# Patient Record
Sex: Female | Born: 1965 | Race: Black or African American | Hispanic: No | Marital: Single | Smoking: Current every day smoker
Health system: Southern US, Community
[De-identification: ages and names within clinical notes are randomized; demographics above are authoritative.]

## PROBLEM LIST (undated history)

## (undated) DIAGNOSIS — Z789 Other specified health status: Secondary | ICD-10-CM

## (undated) DIAGNOSIS — F1020 Alcohol dependence, uncomplicated: Secondary | ICD-10-CM

## (undated) DIAGNOSIS — F329 Major depressive disorder, single episode, unspecified: Secondary | ICD-10-CM

## (undated) DIAGNOSIS — F32A Depression, unspecified: Secondary | ICD-10-CM

## (undated) HISTORY — PX: NO PAST SURGERIES: SHX2092

## (undated) HISTORY — DX: Other specified health status: Z78.9

---
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
## 2001-05-29 ENCOUNTER — Encounter: Payer: Self-pay | Admitting: Emergency Medicine

## 2001-05-29 ENCOUNTER — Emergency Department (HOSPITAL_COMMUNITY): Admission: EM | Admit: 2001-05-29 | Discharge: 2001-05-29 | Payer: Self-pay | Admitting: Emergency Medicine

## 2001-09-06 ENCOUNTER — Emergency Department (HOSPITAL_COMMUNITY): Admission: EM | Admit: 2001-09-06 | Discharge: 2001-09-06 | Payer: Self-pay | Admitting: Emergency Medicine

## 2001-09-06 ENCOUNTER — Encounter: Payer: Self-pay | Admitting: Emergency Medicine

## 2003-12-23 ENCOUNTER — Emergency Department (HOSPITAL_COMMUNITY): Admission: EM | Admit: 2003-12-23 | Discharge: 2003-12-23 | Payer: Self-pay | Admitting: Emergency Medicine

## 2006-01-12 ENCOUNTER — Emergency Department (HOSPITAL_COMMUNITY): Admission: EM | Admit: 2006-01-12 | Discharge: 2006-01-12 | Payer: Self-pay | Admitting: Emergency Medicine

## 2006-03-04 ENCOUNTER — Emergency Department (HOSPITAL_COMMUNITY): Admission: EM | Admit: 2006-03-04 | Discharge: 2006-03-04 | Payer: Self-pay | Admitting: Emergency Medicine

## 2006-08-21 ENCOUNTER — Emergency Department (HOSPITAL_COMMUNITY): Admission: EM | Admit: 2006-08-21 | Discharge: 2006-08-22 | Payer: Self-pay | Admitting: Emergency Medicine

## 2007-10-26 ENCOUNTER — Emergency Department (HOSPITAL_COMMUNITY): Admission: EM | Admit: 2007-10-26 | Discharge: 2007-10-26 | Payer: Self-pay | Admitting: Emergency Medicine

## 2011-05-28 LAB — CBC
HCT: 33.4 — ABNORMAL LOW
MCHC: 34.6
MCV: 102.9 — ABNORMAL HIGH
Platelets: 61 — ABNORMAL LOW
RDW: 21.4 — ABNORMAL HIGH

## 2011-05-28 LAB — RAPID URINE DRUG SCREEN, HOSP PERFORMED
Amphetamines: NOT DETECTED
Benzodiazepines: NOT DETECTED
Cocaine: NOT DETECTED
Tetrahydrocannabinol: NOT DETECTED

## 2011-05-28 LAB — DIFFERENTIAL
Basophils Absolute: 0
Eosinophils Absolute: 0
Lymphs Abs: 0.8
Monocytes Absolute: 0.1

## 2011-05-28 LAB — URINALYSIS, ROUTINE W REFLEX MICROSCOPIC
Specific Gravity, Urine: 1.023
Urobilinogen, UA: 1

## 2011-05-28 LAB — I-STAT 8, (EC8 V) (CONVERTED LAB)
BUN: 7
Bicarbonate: 24.2 — ABNORMAL HIGH
Glucose, Bld: 99
Hemoglobin: 12.9
Sodium: 128 — ABNORMAL LOW
pCO2, Ven: 30.4 — ABNORMAL LOW

## 2011-05-28 LAB — URINE MICROSCOPIC-ADD ON

## 2016-04-27 ENCOUNTER — Encounter (HOSPITAL_COMMUNITY): Payer: Self-pay | Admitting: Emergency Medicine

## 2016-04-27 ENCOUNTER — Ambulatory Visit (HOSPITAL_COMMUNITY)
Admission: EM | Admit: 2016-04-27 | Discharge: 2016-04-27 | Disposition: A | Discharge status: Home / Self Care | Payer: Self-pay | Attending: Family Medicine | Admitting: Family Medicine

## 2016-04-27 DIAGNOSIS — R05 Cough: Secondary | ICD-10-CM

## 2016-04-27 DIAGNOSIS — R059 Cough, unspecified: Secondary | ICD-10-CM

## 2016-04-27 DIAGNOSIS — J40 Bronchitis, not specified as acute or chronic: Secondary | ICD-10-CM

## 2016-04-27 MED ORDER — BENZONATATE 100 MG PO CAPS: 200.0000 mg | ORAL_CAPSULE | Freq: Three times a day (TID) | ORAL | 0 refills | Status: DC | PRN

## 2016-04-27 MED ORDER — AZITHROMYCIN 250 MG PO TABS: 250.0000 mg | ORAL_TABLET | Freq: Every day | ORAL | 0 refills | Status: DC

## 2016-04-27 NOTE — ED Triage Notes (Signed)
[facility] with a complaint of fatigue x 1 week. The patient stated that she had a fever and chest congestion last week.

## 2016-06-01 ENCOUNTER — Encounter (HOSPITAL_COMMUNITY): Payer: Self-pay

## 2016-06-01 ENCOUNTER — Emergency Department (HOSPITAL_COMMUNITY): Payer: 59

## 2016-06-01 ENCOUNTER — Emergency Department (HOSPITAL_COMMUNITY)
Admission: EM | Admit: 2016-06-01 | Discharge: 2016-06-02 | Disposition: A | Discharge status: Home / Self Care | Payer: 59 | Attending: Emergency Medicine | Admitting: Emergency Medicine

## 2016-06-01 DIAGNOSIS — R0602 Shortness of breath: Secondary | ICD-10-CM | POA: Diagnosis present

## 2016-06-01 DIAGNOSIS — J189 Pneumonia, unspecified organism: Secondary | ICD-10-CM | POA: Diagnosis not present

## 2016-06-01 DIAGNOSIS — F17210 Nicotine dependence, cigarettes, uncomplicated: Secondary | ICD-10-CM | POA: Diagnosis not present

## 2016-06-01 LAB — BASIC METABOLIC PANEL
Anion gap: 20 — ABNORMAL HIGH (ref 5–15)
BUN: 9 mg/dL (ref 6–20)
CO2: 22 mmol/L (ref 22–32)
CREATININE: 0.69 mg/dL (ref 0.44–1.00)
Calcium: 9.2 mg/dL (ref 8.9–10.3)
Chloride: 93 mmol/L — ABNORMAL LOW (ref 101–111)
GFR calc Af Amer: >60 mL/min (ref >60)
GLUCOSE: 160 mg/dL — AB (ref 65–99)
Potassium: 2.7 mmol/L — CL (ref 3.5–5.1)
SODIUM: 135 mmol/L (ref 135–145)

## 2016-06-01 LAB — CBC
HCT: 37.4 % (ref 36.0–46.0)
Hemoglobin: 12.7 g/dL (ref 12.0–15.0)
MCH: 32.7 pg (ref 26.0–34.0)
MCHC: 34 g/dL (ref 30.0–36.0)
MCV: 96.4 fL (ref 78.0–100.0)
PLATELETS: 171 10*3/uL (ref 150–400)
RBC: 3.88 MIL/uL (ref 3.87–5.11)
RDW: 14.7 % (ref 11.5–15.5)
WBC: 14.6 10*3/uL — AB (ref 4.0–10.5)

## 2016-06-01 LAB — I-STAT TROPONIN, ED: Troponin i, poc: 0.01 ng/mL (ref 0.00–0.08)

## 2016-06-01 MED ORDER — ALBUTEROL SULFATE (2.5 MG/3ML) 0.083% IN NEBU
5.0000 mg | INHALATION_SOLUTION | Freq: Once | RESPIRATORY_TRACT | Status: AC
  Administered 2016-06-02: 5 mg via RESPIRATORY_TRACT
  Filled 2016-06-01: qty 6

## 2016-06-01 MED ORDER — SODIUM CHLORIDE 0.9 % IV SOLN
1000.0000 mL | Freq: Once | INTRAVENOUS | Status: AC
  Administered 2016-06-02: 1000 mL via INTRAVENOUS

## 2016-06-01 MED ORDER — SODIUM CHLORIDE 0.9 % IV SOLN
1000.0000 mL | INTRAVENOUS | Status: DC
  Administered 2016-06-02: 1000 mL via INTRAVENOUS

## 2016-06-01 MED ORDER — IPRATROPIUM BROMIDE 0.02 % IN SOLN
0.5000 mg | Freq: Once | RESPIRATORY_TRACT | Status: AC
  Administered 2016-06-02: 0.5 mg via RESPIRATORY_TRACT
  Filled 2016-06-01: qty 2.5

## 2016-06-01 MED ORDER — PREDNISONE 20 MG PO TABS
60.0000 mg | ORAL_TABLET | Freq: Once | ORAL | Status: DC
  Filled 2016-06-01: qty 3

## 2016-06-01 NOTE — ED Triage Notes (Addendum)
Pt has had shortness of breath X2 months. Pt reports she was given abx without improvement. Pt is very hoarse. Tachycardic in triage. Resp e/u. Lung sounds clear, throat and tongue very red.

## 2016-06-01 NOTE — ED Provider Notes (Signed)
[facility]-EMERGENCY DEPT Provider Note   CSN: [id number] Arrival date & time: 06/01/16  1903   By signing my name below, I, [name], attest that this documentation has been prepared under the direction and in the presence of [name], MD  Electronically Signed: [name], ED Scribe. 06/01/16. 11:46 PM.   History   Chief Complaint Chief Complaint  Patient presents with  . Shortness of Breath     The history is provided by the patient. No language interpreter was used.   HPI Comments:  Virginia Hess is a 50 y.o. female who presents to the Emergency Department complaining of SOB onset 2 months ago. Associated symptoms include fatigue, leg pain, change in appetite and mild chest pain. She notes there is a problem of air circulation at her place of work at [employer]. Pt believes the coldness at work is exacerbating her symptoms. Pt states she visited UC on 04/27/16 and was prescribed antibiotics with relief but notes her symptoms returned. She denies abdominal pain, a hx of COPD or emphysema, and taking breathing treatments. Pt is a smoker for 30 years and occasionally uses alcohol.    History reviewed. No pertinent past medical history.  There are no active problems to display for this patient.   History reviewed. No pertinent surgical history.  OB History    No data available       Home Medications    Prior to Admission medications   Medication Sig Start Date End Date Taking? Authorizing Provider  azithromycin (ZITHROMAX) 250 MG tablet Take 1 tablet (250 mg total) by mouth daily. Take first 2 tablets together, then 1 every day until finished. 04/27/16   [name], FNP  benzonatate (TESSALON) 100 MG capsule Take 2 capsules (200 mg total) by mouth 3 (three) times daily as needed for cough. 04/27/16   [name], FNP    Family History History reviewed. No pertinent family history.  Social History Social History  Substance Use Topics  . Smoking  status: Current Every Day Smoker    Packs/day: 0.50    Years: 20.00    Types: Cigarettes  . Smokeless tobacco: Never Used  . Alcohol use Yes     Comment: occassional     Allergies   Review of patient's allergies indicates no known allergies.   Review of Systems Review of Systems  Constitutional: Positive for appetite change and fatigue.  Respiratory: Positive for shortness of breath.   Cardiovascular: Positive for chest pain.  Gastrointestinal: Negative for abdominal pain.  Musculoskeletal: Positive for myalgias.   10 systems reviewed and all are negative for acute change except as noted in the HPI.    Physical Exam Updated Vital Signs BP 148/94   Pulse 116   Temp 98.7 F (37.1 C)   Resp 18   Ht 5\' 4"  (1.626 m)   Wt 160 lb (72.6 kg)   SpO2 98%   BMI 27.46 kg/m   Physical Exam  Constitutional: She is oriented to person, place, and time. She appears well-developed and well-nourished. No distress.  HENT:  Head: Normocephalic and atraumatic.  Eyes: EOM are normal.  Neck: Normal range of motion.  Cardiovascular: Regular rhythm.   Tachycardic heart rate  Pulmonary/Chest: Effort normal and breath sounds normal.  Bilateral rhonchi  Abdominal: Soft. She exhibits no distension. There is no tenderness.  Musculoskeletal: Normal range of motion.  Neurological: She is alert and oriented to person, place, and time.  Skin: Skin is warm and dry.  Psychiatric: She has a normal mood and affect. Judgment normal.  Nursing note and vitals reviewed.    ED Treatments / Results  DIAGNOSTIC STUDIES:  Oxygen Saturation is 98% on RA, normal by my interpretation.    COORDINATION OF CARE:  11:37 PM Discussed treatment plan with pt at bedside and pt agreed to plan.\  Labs (all labs ordered are listed, but only abnormal results are displayed) Labs Reviewed  BASIC METABOLIC PANEL - Abnormal; Notable for the following:       Result Value   Potassium 2.7 (*)    Chloride 93 (*)      Glucose, Bld 160 (*)    Anion gap 20 (*)    All other components within normal limits  CBC - Abnormal; Notable for the following:    WBC 14.6 (*)    All other components within normal limits  ETHANOL - Abnormal; Notable for the following:    Alcohol, Ethyl (B) 158 (*)    All other components within normal limits  I-STAT TROPOININ, ED    EKG  EKG Interpretation  Date/Time:  Tuesday June 01 2016 19:08:30 EDT Ventricular Rate:  139 PR Interval:    QRS Duration: 66 QT Interval:  358 QTC Calculation: 544 R Axis:   52 Text Interpretation:  Sinus tachycardia Nonspecific ST and T wave abnormality Abnormal ECG nonspecific st changes new since prior tracing Confirmed by [name]  MD, Caryn BeeKEVIN ([id number]) on 06/01/2016 11:19:10 PM       Radiology Dg Chest 2 View  Result Date: 06/01/2016 CLINICAL DATA:  Chest pain, shortness of breath, cough EXAM: CHEST  2 VIEW COMPARISON:  10/26/2007 FINDINGS: The heart size and mediastinal contours are within normal limits. Both lungs are clear. The visualized skeletal structures are unremarkable. IMPRESSION: No active cardiopulmonary disease. Electronically Signed   By: Elige KoHetal  Patel   On: 06/01/2016 20:50   Ct Angio Chest Pe W And/or Wo Contrast  Result Date: 06/02/2016 CLINICAL DATA:  50 year old female with shortness of breath and chest pain and tachycardia. EXAM: CT ANGIOGRAPHY CHEST WITH CONTRAST TECHNIQUE: Multidetector CT imaging of the chest was performed using the standard protocol during bolus administration of intravenous contrast. Multiplanar CT image reconstructions and MIPs were obtained to evaluate the vascular anatomy. CONTRAST:  100 cc Isovue 370 COMPARISON:  Chest radiograph dated 06/01/2016 FINDINGS: No CT evidence of pulmonary embolism. There is a focal area of subpleural nodular and ground-glass density at the right lung base concerning for developing pneumonia. Focal pulmonary infarct is less likely in the absence of pulmonary embolism.  Clinical correlation and follow-up to resolution recommended. The remainder of the lungs are clear. There is no pleural effusion or pneumothorax. The central airways are patent. The thoracic aorta appears unremarkable. The origins of the great vessels of the aortic arch appear patent. There is no cardiomegaly or pericardial effusion. No hilar or mediastinal adenopathy. The esophagus is grossly unremarkable. No thyroid nodules identified. There is no axillary adenopathy. The chest wall soft tissues appear unremarkable. The osseous structures are intact. Diffuse fatty infiltration of the liver. A 2.5 cm left adrenal adenoma. The visualized upper abdomen is otherwise unremarkable. IMPRESSION: No CT evidence of pulmonary embolism. Small focal subpleural density at the right lung base concerning for pneumonia. Clinical correlation and follow-up to resolution recommended. Electronically Signed   By: Elgie CollardArash  Radparvar M.D.   On: 06/02/2016 03:22    Procedures Procedures (including critical care time)  Medications Ordered in ED Medications  predniSONE (DELTASONE) tablet 60 mg (  60 mg Oral Not Given 06/02/16 0100)  0.9 %  sodium chloride infusion (0 mLs Intravenous Stopped 06/02/16 0218)    Followed by  0.9 %  sodium chloride infusion (0 mLs Intravenous Stopped 06/02/16 0433)  albuterol (PROVENTIL) (2.5 MG/3ML) 0.083% nebulizer solution 5 mg (5 mg Nebulization Given 06/02/16 0034)  ipratropium (ATROVENT) nebulizer solution 0.5 mg (0.5 mg Nebulization Given 06/02/16 0034)  methylPREDNISolone sodium succinate (SOLU-MEDROL) 125 mg/2 mL injection 125 mg (125 mg Intravenous Given 06/02/16 0153)  iopamidol (ISOVUE-370) 76 % injection (100 mLs  Contrast Given 06/02/16 0143)     Initial Impression / Assessment and Plan / ED Course  I have reviewed the triage vital signs and the nursing notes.  Pertinent labs & imaging results that were available during my care of the patient were reviewed by me and considered in my  medical decision making (see chart for details).  Clinical Course    I suspect the patient has some degree of COPD exacerbation.  Pneumonia noted on CT chest.  No PE.  Patient feels better this time.  Home with albuterol, prednisone, Levaquin.   Final Clinical Impressions(s) / ED Diagnoses   Final diagnoses:  CAP (community acquired pneumonia)    New Prescriptions Discharge Medication List as of 06/02/2016  3:55 AM    START taking these medications   Details  albuterol (PROVENTIL HFA;VENTOLIN HFA) 108 (90 Base) MCG/ACT inhaler Inhale 2 puffs into the lungs every 4 (four) hours as needed for wheezing or shortness of breath., Starting Wed 06/02/2016, Print    levofloxacin (LEVAQUIN) 500 MG tablet Take 1 tablet (500 mg total) by mouth daily., Starting Wed 06/02/2016, Print    predniSONE (DELTASONE) 10 MG tablet Take 6 tablets (60 mg total) by mouth daily., Starting Wed 06/02/2016, Print        I personally performed the services described in this documentation, which was scribed in my presence. The recorded information has been reviewed and is accurate.        [name], MD 06/02/16 [phone]

## 2016-06-02 ENCOUNTER — Emergency Department (HOSPITAL_COMMUNITY): Payer: 59

## 2016-06-02 LAB — ETHANOL: ALCOHOL ETHYL (B): 158 mg/dL — AB (ref <5)

## 2016-06-02 MED ORDER — PREDNISONE 10 MG PO TABS: 60.0000 mg | ORAL_TABLET | Freq: Every day | ORAL | 0 refills | Status: DC

## 2016-06-02 MED ORDER — LEVOFLOXACIN 500 MG PO TABS: 500.0000 mg | ORAL_TABLET | Freq: Every day | ORAL | 0 refills | Status: DC

## 2016-06-02 MED ORDER — METHYLPREDNISOLONE SODIUM SUCC 125 MG IJ SOLR
125.0000 mg | Freq: Once | INTRAMUSCULAR | Status: AC
  Administered 2016-06-02: 125 mg via INTRAVENOUS
  Filled 2016-06-02: qty 2

## 2016-06-02 MED ORDER — ALBUTEROL SULFATE HFA 108 (90 BASE) MCG/ACT IN AERS: 2.0000 | INHALATION_SPRAY | RESPIRATORY_TRACT | 0 refills | Status: DC | PRN

## 2016-06-02 MED ORDER — IOPAMIDOL (ISOVUE-370) INJECTION 76%
INTRAVENOUS | Status: AC
Start: 2016-06-02 — End: 2016-06-02
  Administered 2016-06-02: 100 mL
  Filled 2016-06-02: qty 100

## 2016-06-02 MED ORDER — METHYLPREDNISOLONE SODIUM SUCC 125 MG IJ SOLR: 125.0000 mg | Freq: Once | INTRAMUSCULAR | Status: DC

## 2016-06-02 NOTE — ED Notes (Signed)
IV attempted x 1. 

## 2016-06-29 ENCOUNTER — Inpatient Hospital Stay (HOSPITAL_COMMUNITY)
Admission: EM | Admit: 2016-06-29 | Discharge: 2016-07-05 | DRG: 897 | Disposition: A | Discharge status: Home / Self Care | Payer: 59 | Attending: Internal Medicine | Admitting: Internal Medicine

## 2016-06-29 ENCOUNTER — Encounter (HOSPITAL_COMMUNITY): Payer: Self-pay | Admitting: Emergency Medicine

## 2016-06-29 ENCOUNTER — Emergency Department (HOSPITAL_COMMUNITY): Payer: 59

## 2016-06-29 DIAGNOSIS — F339 Major depressive disorder, recurrent, unspecified: Secondary | ICD-10-CM

## 2016-06-29 DIAGNOSIS — Z6821 Body mass index (BMI) 21.0-21.9, adult: Secondary | ICD-10-CM | POA: Diagnosis not present

## 2016-06-29 DIAGNOSIS — F321 Major depressive disorder, single episode, moderate: Secondary | ICD-10-CM

## 2016-06-29 DIAGNOSIS — Z7952 Long term (current) use of systemic steroids: Secondary | ICD-10-CM | POA: Diagnosis not present

## 2016-06-29 DIAGNOSIS — K8010 Calculus of gallbladder with chronic cholecystitis without obstruction: Secondary | ICD-10-CM | POA: Diagnosis present

## 2016-06-29 DIAGNOSIS — R7989 Other specified abnormal findings of blood chemistry: Secondary | ICD-10-CM | POA: Diagnosis present

## 2016-06-29 DIAGNOSIS — E871 Hypo-osmolality and hyponatremia: Secondary | ICD-10-CM | POA: Diagnosis present

## 2016-06-29 DIAGNOSIS — E872 Acidosis, unspecified: Secondary | ICD-10-CM

## 2016-06-29 DIAGNOSIS — F10129 Alcohol abuse with intoxication, unspecified: Secondary | ICD-10-CM | POA: Diagnosis present

## 2016-06-29 DIAGNOSIS — Z79899 Other long term (current) drug therapy: Secondary | ICD-10-CM | POA: Diagnosis not present

## 2016-06-29 DIAGNOSIS — R634 Abnormal weight loss: Secondary | ICD-10-CM | POA: Diagnosis present

## 2016-06-29 DIAGNOSIS — D539 Nutritional anemia, unspecified: Secondary | ICD-10-CM | POA: Diagnosis present

## 2016-06-29 DIAGNOSIS — K7010 Alcoholic hepatitis without ascites: Secondary | ICD-10-CM

## 2016-06-29 DIAGNOSIS — E876 Hypokalemia: Secondary | ICD-10-CM

## 2016-06-29 DIAGNOSIS — F1020 Alcohol dependence, uncomplicated: Secondary | ICD-10-CM

## 2016-06-29 DIAGNOSIS — R0600 Dyspnea, unspecified: Secondary | ICD-10-CM

## 2016-06-29 DIAGNOSIS — F329 Major depressive disorder, single episode, unspecified: Secondary | ICD-10-CM | POA: Diagnosis present

## 2016-06-29 DIAGNOSIS — E860 Dehydration: Secondary | ICD-10-CM | POA: Diagnosis present

## 2016-06-29 DIAGNOSIS — R638 Other symptoms and signs concerning food and fluid intake: Secondary | ICD-10-CM | POA: Diagnosis present

## 2016-06-29 DIAGNOSIS — R945 Abnormal results of liver function studies: Secondary | ICD-10-CM

## 2016-06-29 DIAGNOSIS — N390 Urinary tract infection, site not specified: Secondary | ICD-10-CM

## 2016-06-29 DIAGNOSIS — R05 Cough: Secondary | ICD-10-CM | POA: Diagnosis present

## 2016-06-29 DIAGNOSIS — E873 Alkalosis: Secondary | ICD-10-CM

## 2016-06-29 DIAGNOSIS — F1010 Alcohol abuse, uncomplicated: Secondary | ICD-10-CM | POA: Diagnosis not present

## 2016-06-29 LAB — CBC
HCT: 25.9 % — ABNORMAL LOW (ref 36.0–46.0)
Hemoglobin: 9.3 g/dL — ABNORMAL LOW (ref 12.0–15.0)
MCH: 34.4 pg — AB (ref 26.0–34.0)
MCHC: 35.9 g/dL (ref 30.0–36.0)
MCV: 95.9 fL (ref 78.0–100.0)
PLATELETS: 179 10*3/uL (ref 150–400)
RBC: 2.7 MIL/uL — ABNORMAL LOW (ref 3.87–5.11)
RDW: 20 % — AB (ref 11.5–15.5)
WBC: 7.2 10*3/uL (ref 4.0–10.5)

## 2016-06-29 LAB — CBC WITH DIFFERENTIAL/PLATELET
BASOS PCT: 0 %
Basophils Absolute: 0 10*3/uL (ref 0.0–0.1)
EOS PCT: 0 %
Eosinophils Absolute: 0 10*3/uL (ref 0.0–0.7)
HEMATOCRIT: 31.6 % — AB (ref 36.0–46.0)
Hemoglobin: 11.3 g/dL — ABNORMAL LOW (ref 12.0–15.0)
LYMPHS PCT: 27 %
Lymphs Abs: 2.3 10*3/uL (ref 0.7–4.0)
MCH: 34.6 pg — ABNORMAL HIGH (ref 26.0–34.0)
MCHC: 35.8 g/dL (ref 30.0–36.0)
MCV: 96.6 fL (ref 78.0–100.0)
MONOS PCT: 4 %
Monocytes Absolute: 0.3 10*3/uL (ref 0.1–1.0)
NEUTROS PCT: 69 %
Neutro Abs: 6 10*3/uL (ref 1.7–7.7)
PLATELETS: 202 10*3/uL (ref 150–400)
RBC: 3.27 MIL/uL — AB (ref 3.87–5.11)
RDW: 20.2 % — AB (ref 11.5–15.5)
WBC: 8.6 10*3/uL (ref 4.0–10.5)
nRBC: 2 /100 WBC — ABNORMAL HIGH

## 2016-06-29 LAB — RAPID URINE DRUG SCREEN, HOSP PERFORMED
AMPHETAMINES: NOT DETECTED
BENZODIAZEPINES: NOT DETECTED
Barbiturates: NOT DETECTED
Cocaine: NOT DETECTED
OPIATES: NOT DETECTED
TETRAHYDROCANNABINOL: NOT DETECTED

## 2016-06-29 LAB — BLOOD GAS, ARTERIAL
ACID-BASE DEFICIT: 2.1 mmol/L — AB (ref 0.0–2.0)
BICARBONATE: 20.8 mmol/L (ref 20.0–28.0)
Drawn by: 441261
FIO2: 21
O2 Saturation: 95.9 %
PCO2 ART: 29.8 mmHg — AB (ref 32.0–48.0)
PH ART: 7.457 — AB (ref 7.350–7.450)
PO2 ART: 89.4 mmHg (ref 83.0–108.0)
Patient temperature: 98.6

## 2016-06-29 LAB — MAGNESIUM
Magnesium: 1.6 mg/dL — ABNORMAL LOW (ref 1.7–2.4)
Magnesium: 1.9 mg/dL (ref 1.7–2.4)

## 2016-06-29 LAB — BASIC METABOLIC PANEL
Anion gap: 12 (ref 5–15)
BUN: 8 mg/dL (ref 6–20)
CHLORIDE: 93 mmol/L — AB (ref 101–111)
CO2: 24 mmol/L (ref 22–32)
CREATININE: 0.37 mg/dL — AB (ref 0.44–1.00)
Calcium: 7.4 mg/dL — ABNORMAL LOW (ref 8.9–10.3)
GFR calc non Af Amer: >60 mL/min (ref >60)
Glucose, Bld: 115 mg/dL — ABNORMAL HIGH (ref 65–99)
POTASSIUM: 3 mmol/L — AB (ref 3.5–5.1)
Sodium: 129 mmol/L — ABNORMAL LOW (ref 135–145)

## 2016-06-29 LAB — PHOSPHORUS: Phosphorus: 1.6 mg/dL — ABNORMAL LOW (ref 2.5–4.6)

## 2016-06-29 LAB — URINALYSIS, ROUTINE W REFLEX MICROSCOPIC
GLUCOSE, UA: NEGATIVE mg/dL
KETONES UR: NEGATIVE mg/dL
Nitrite: NEGATIVE
PH: 6 (ref 5.0–8.0)
Protein, ur: NEGATIVE mg/dL
SPECIFIC GRAVITY, URINE: 1.019 (ref 1.005–1.030)

## 2016-06-29 LAB — COMPREHENSIVE METABOLIC PANEL
ALBUMIN: 2.9 g/dL — AB (ref 3.5–5.0)
ALT: 128 U/L — ABNORMAL HIGH (ref 14–54)
ANION GAP: 25 — AB (ref 5–15)
AST: 476 U/L — AB (ref 15–41)
Alkaline Phosphatase: 670 U/L — ABNORMAL HIGH (ref 38–126)
BILIRUBIN TOTAL: 3.5 mg/dL — AB (ref 0.3–1.2)
BUN: 9 mg/dL (ref 6–20)
CHLORIDE: 82 mmol/L — AB (ref 101–111)
CO2: 20 mmol/L — ABNORMAL LOW (ref 22–32)
Calcium: 8.3 mg/dL — ABNORMAL LOW (ref 8.9–10.3)
Creatinine, Ser: 0.42 mg/dL — ABNORMAL LOW (ref 0.44–1.00)
GFR calc Af Amer: >60 mL/min (ref >60)
Glucose, Bld: 134 mg/dL — ABNORMAL HIGH (ref 65–99)
POTASSIUM: 2.4 mmol/L — AB (ref 3.5–5.1)
Sodium: 127 mmol/L — ABNORMAL LOW (ref 135–145)
TOTAL PROTEIN: 5.6 g/dL — AB (ref 6.5–8.1)

## 2016-06-29 LAB — RETICULOCYTES
RBC.: 2.7 MIL/uL — ABNORMAL LOW (ref 3.87–5.11)
RETIC CT PCT: 2.4 % (ref 0.4–3.1)
Retic Count, Absolute: 64.8 10*3/uL (ref 19.0–186.0)

## 2016-06-29 LAB — APTT: aPTT: 27 seconds (ref 24–36)

## 2016-06-29 LAB — URINE MICROSCOPIC-ADD ON

## 2016-06-29 LAB — PROTIME-INR
INR: 1.06
PROTHROMBIN TIME: 13.8 s (ref 11.4–15.2)

## 2016-06-29 LAB — LIPASE, BLOOD: LIPASE: 65 U/L — AB (ref 11–51)

## 2016-06-29 LAB — IRON AND TIBC
IRON: 118 ug/dL (ref 28–170)
SATURATION RATIOS: 97 % — AB (ref 10.4–31.8)
TIBC: 122 ug/dL — AB (ref 250–450)
UIBC: 4 ug/dL

## 2016-06-29 LAB — FOLATE: Folate: 18.7 ng/mL (ref >5.9)

## 2016-06-29 LAB — SALICYLATE LEVEL

## 2016-06-29 LAB — I-STAT CG4 LACTIC ACID, ED: LACTIC ACID, VENOUS: 12.93 mmol/L — AB (ref 0.5–1.9)

## 2016-06-29 LAB — OSMOLALITY: OSMOLALITY: 319 mosm/kg — AB (ref 275–295)

## 2016-06-29 LAB — CREATININE, SERUM: Creatinine, Ser: <0.3 mg/dL — ABNORMAL LOW (ref 0.44–1.00)

## 2016-06-29 LAB — CK: Total CK: 76 U/L (ref 38–234)

## 2016-06-29 LAB — ETHANOL: Alcohol, Ethyl (B): 177 mg/dL — ABNORMAL HIGH (ref <5)

## 2016-06-29 LAB — OSMOLALITY, URINE: OSMOLALITY UR: 551 mosm/kg (ref 300–900)

## 2016-06-29 LAB — ACETAMINOPHEN LEVEL: Acetaminophen (Tylenol), Serum: <10 ug/mL — ABNORMAL LOW (ref 10–30)

## 2016-06-29 LAB — VITAMIN B12: VITAMIN B 12: 946 pg/mL — AB (ref 180–914)

## 2016-06-29 LAB — FERRITIN: FERRITIN: 5230 ng/mL — AB (ref 11–307)

## 2016-06-29 MED ORDER — IPRATROPIUM-ALBUTEROL 0.5-2.5 (3) MG/3ML IN SOLN: 3.0000 mL | RESPIRATORY_TRACT | Status: DC | PRN

## 2016-06-29 MED ORDER — LORATADINE 10 MG PO TABS
10.0000 mg | ORAL_TABLET | Freq: Every day | ORAL | Status: DC
  Administered 2016-06-30 – 2016-07-05 (×6): 10 mg via ORAL
  Filled 2016-06-29 (×6): qty 1

## 2016-06-29 MED ORDER — M.V.I. ADULT IV INJ
INJECTION | Freq: Once | INTRAVENOUS | Status: AC
  Administered 2016-06-29: 15:00:00 via INTRAVENOUS
  Filled 2016-06-29: qty 1000

## 2016-06-29 MED ORDER — MAGNESIUM SULFATE 2 GM/50ML IV SOLN
2.0000 g | Freq: Once | INTRAVENOUS | Status: AC
  Administered 2016-06-29: 2 g via INTRAVENOUS
  Filled 2016-06-29: qty 50

## 2016-06-29 MED ORDER — POLYETHYLENE GLYCOL 3350 17 G PO PACK: 17.0000 g | PACK | Freq: Every day | ORAL | Status: DC | PRN

## 2016-06-29 MED ORDER — BENZONATATE 100 MG PO CAPS
100.0000 mg | ORAL_CAPSULE | Freq: Three times a day (TID) | ORAL | Status: DC
  Administered 2016-06-29 – 2016-07-05 (×17): 100 mg via ORAL
  Filled 2016-06-29 (×17): qty 1

## 2016-06-29 MED ORDER — ONDANSETRON HCL 4 MG/2ML IJ SOLN
4.0000 mg | Freq: Four times a day (QID) | INTRAMUSCULAR | Status: DC | PRN
  Administered 2016-07-03 – 2016-07-04 (×2): 4 mg via INTRAVENOUS
  Filled 2016-06-29 (×2): qty 2

## 2016-06-29 MED ORDER — THIAMINE HCL 100 MG/ML IJ SOLN
100.0000 mg | Freq: Once | INTRAMUSCULAR | Status: AC
  Administered 2016-06-29: 100 mg via INTRAVENOUS
  Filled 2016-06-29: qty 2

## 2016-06-29 MED ORDER — VITAMIN B-1 100 MG PO TABS
100.0000 mg | ORAL_TABLET | Freq: Every day | ORAL | Status: DC
  Administered 2016-06-30 – 2016-07-01 (×2): 100 mg via ORAL
  Filled 2016-06-29 (×2): qty 1

## 2016-06-29 MED ORDER — SODIUM CHLORIDE 0.9 % IV BOLUS (SEPSIS)
1000.0000 mL | Freq: Once | INTRAVENOUS | Status: AC
Start: 2016-06-29 — End: 2016-06-29
  Administered 2016-06-29: 1000 mL via INTRAVENOUS

## 2016-06-29 MED ORDER — CEFPODOXIME PROXETIL 200 MG PO TABS
200.0000 mg | ORAL_TABLET | Freq: Two times a day (BID) | ORAL | Status: DC
  Administered 2016-06-29 – 2016-07-04 (×10): 200 mg via ORAL
  Filled 2016-06-29 (×11): qty 1

## 2016-06-29 MED ORDER — THIAMINE HCL 100 MG/ML IJ SOLN: 100.0000 mg | Freq: Every day | INTRAMUSCULAR | Status: DC

## 2016-06-29 MED ORDER — FOLIC ACID 1 MG PO TABS
1.0000 mg | ORAL_TABLET | Freq: Once | ORAL | Status: AC
  Administered 2016-06-29: 1 mg via ORAL
  Filled 2016-06-29: qty 1

## 2016-06-29 MED ORDER — IPRATROPIUM-ALBUTEROL 0.5-2.5 (3) MG/3ML IN SOLN
3.0000 mL | Freq: Four times a day (QID) | RESPIRATORY_TRACT | Status: DC
  Administered 2016-06-29: 3 mL via RESPIRATORY_TRACT
  Filled 2016-06-29: qty 3

## 2016-06-29 MED ORDER — ENSURE ENLIVE PO LIQD
237.0000 mL | Freq: Two times a day (BID) | ORAL | Status: DC
  Administered 2016-06-30 – 2016-07-05 (×8): 237 mL via ORAL

## 2016-06-29 MED ORDER — PHENOL 1.4 % MT LIQD
1.0000 | OROMUCOSAL | Status: DC | PRN
  Filled 2016-06-29: qty 177

## 2016-06-29 MED ORDER — FOLIC ACID 1 MG PO TABS
1.0000 mg | ORAL_TABLET | Freq: Every day | ORAL | Status: DC
  Administered 2016-06-30 – 2016-07-05 (×6): 1 mg via ORAL
  Filled 2016-06-29 (×6): qty 1

## 2016-06-29 MED ORDER — ENOXAPARIN SODIUM 40 MG/0.4ML ~~LOC~~ SOLN
40.0000 mg | SUBCUTANEOUS | Status: DC
  Administered 2016-06-29 – 2016-07-04 (×6): 40 mg via SUBCUTANEOUS
  Filled 2016-06-29 (×6): qty 0.4

## 2016-06-29 MED ORDER — ACETAMINOPHEN 650 MG RE SUPP: 650.0000 mg | Freq: Four times a day (QID) | RECTAL | Status: DC | PRN

## 2016-06-29 MED ORDER — ADULT MULTIVITAMIN W/MINERALS CH
1.0000 | ORAL_TABLET | Freq: Every day | ORAL | Status: DC
Start: 2016-06-29 — End: 2016-07-05
  Administered 2016-06-30 – 2016-07-05 (×6): 1 via ORAL
  Filled 2016-06-29 (×6): qty 1

## 2016-06-29 MED ORDER — IBUPROFEN 200 MG PO TABS: 400.0000 mg | ORAL_TABLET | Freq: Four times a day (QID) | ORAL | Status: DC | PRN

## 2016-06-29 MED ORDER — ONDANSETRON HCL 4 MG PO TABS: 4.0000 mg | ORAL_TABLET | Freq: Four times a day (QID) | ORAL | Status: DC | PRN

## 2016-06-29 MED ORDER — POTASSIUM CHLORIDE 10 MEQ/100ML IV SOLN
10.0000 meq | INTRAVENOUS | Status: AC
  Administered 2016-06-29 (×3): 10 meq via INTRAVENOUS
  Filled 2016-06-29 (×3): qty 100

## 2016-06-29 MED ORDER — ACETAMINOPHEN 325 MG PO TABS
650.0000 mg | ORAL_TABLET | Freq: Four times a day (QID) | ORAL | Status: DC | PRN
  Administered 2016-07-02: 650 mg via ORAL
  Filled 2016-06-29: qty 2

## 2016-06-29 MED ORDER — POTASSIUM CHLORIDE CRYS ER 20 MEQ PO TBCR
40.0000 meq | EXTENDED_RELEASE_TABLET | ORAL | Status: AC
  Administered 2016-06-29: 40 meq via ORAL
  Filled 2016-06-29: qty 2

## 2016-06-29 MED ORDER — METHOCARBAMOL 1000 MG/10ML IJ SOLN
500.0000 mg | Freq: Three times a day (TID) | INTRAVENOUS | Status: DC | PRN
  Administered 2016-06-29: 500 mg via INTRAVENOUS
  Filled 2016-06-29: qty 550
  Filled 2016-06-29: qty 5

## 2016-06-29 MED ORDER — LORAZEPAM 1 MG PO TABS
1.0000 mg | ORAL_TABLET | Freq: Four times a day (QID) | ORAL | Status: AC | PRN
  Administered 2016-07-01: 1 mg via ORAL
  Filled 2016-06-29: qty 1

## 2016-06-29 MED ORDER — MAGNESIUM SULFATE 50 % IJ SOLN: 2.0000 g | Freq: Once | INTRAMUSCULAR | Status: DC

## 2016-06-29 MED ORDER — LORAZEPAM 2 MG/ML IJ SOLN
1.0000 mg | Freq: Four times a day (QID) | INTRAMUSCULAR | Status: AC | PRN
  Administered 2016-06-29 – 2016-06-30 (×3): 1 mg via INTRAVENOUS
  Filled 2016-06-29 (×3): qty 1

## 2016-06-29 MED ORDER — DM-GUAIFENESIN ER 30-600 MG PO TB12
1.0000 | ORAL_TABLET | Freq: Two times a day (BID) | ORAL | Status: DC
  Administered 2016-06-29 – 2016-07-05 (×12): 1 via ORAL
  Filled 2016-06-29 (×12): qty 1

## 2016-06-29 NOTE — ED Notes (Signed)
Dr Clydene PughKnott aware of critical lactic acid- 12.93

## 2016-06-29 NOTE — Progress Notes (Signed)
Pt confirms with ED CM she does not have pcp Pt with 2 female visitors at bedside

## 2016-06-29 NOTE — ED Triage Notes (Signed)
Reports has had a productive cough producing clear mucous. States she has had fevers that comes and goes.

## 2016-06-29 NOTE — ED Notes (Signed)
Gave patient chicken broth

## 2016-06-29 NOTE — ED Provider Notes (Signed)
[facility]-EMERGENCY DEPT Provider Note   CSN: [id number] Arrival date & time: 06/29/16  7564     History   Chief Complaint Chief Complaint  Patient presents with  . Shortness of Breath  . Leg Pain    Numbness and weakness    HPI Virginia Hess is a 50 y.o. female.  The history is provided by the patient.  Shortness of Breath  This is a new problem. The average episode lasts 1 week. The problem occurs continuously.The current episode started more than 1 week ago. The problem has been gradually worsening. Pertinent negatives include no fever, no cough, no syncope, no vomiting and no abdominal pain. Precipitated by: heavy alcohol use. She has tried nothing for the symptoms.    History reviewed. No pertinent past medical history.  There are no active problems to display for this patient.   History reviewed. No pertinent surgical history.  OB History    No data available       Home Medications    Prior to Admission medications   Medication Sig Start Date End Date Taking? Authorizing Provider  albuterol (PROVENTIL HFA;VENTOLIN HFA) 108 (90 Base) MCG/ACT inhaler Inhale 2 puffs into the lungs every 4 (four) hours as needed for wheezing or shortness of breath. 06/02/16  Yes [name], MD  azithromycin (ZITHROMAX) 250 MG tablet Take 1 tablet (250 mg total) by mouth daily. Take first 2 tablets together, then 1 every day until finished. Patient not taking: Reported on 06/29/2016 04/27/16   [name], FNP  benzonatate (TESSALON) 100 MG capsule Take 2 capsules (200 mg total) by mouth 3 (three) times daily as needed for cough. Patient not taking: Reported on 06/29/2016 04/27/16   [name], FNP  levofloxacin (LEVAQUIN) 500 MG tablet Take 1 tablet (500 mg total) by mouth daily. Patient not taking: Reported on 06/29/2016 06/02/16   [name], MD  predniSONE (DELTASONE) 10 MG tablet Take 6 tablets (60 mg total) by mouth daily. Patient not taking: Reported on 06/29/2016  06/02/16   [name], MD    Family History No family history on file.  Social History Social History  Substance Use Topics  . Smoking status: Current Every Day Smoker    Packs/day: 0.50    Years: 20.00    Types: Cigarettes  . Smokeless tobacco: Never Used  . Alcohol use Yes     Comment: occassional     Allergies   Review of patient's allergies indicates no known allergies.   Review of Systems Review of Systems  Constitutional: Negative for fever.  Respiratory: Positive for shortness of breath. Negative for cough.   Cardiovascular: Negative for syncope.  Gastrointestinal: Negative for abdominal pain and vomiting.  All other systems reviewed and are negative.    Physical Exam Updated Vital Signs BP (!) 162/101 (BP Location: Left Arm)   Pulse (!) 128   Temp 99.5 F (37.5 C)   Resp 20   SpO2 98%   Physical Exam  Constitutional: She is oriented to person, place, and time. She appears well-developed. She appears listless. She has a sickly appearance. No distress.  HENT:  Head: Normocephalic.  Nose: Nose normal.  Eyes: Conjunctivae are normal.  Neck: Neck supple. No tracheal deviation present.  Cardiovascular: Regular rhythm and normal heart sounds.  Tachycardia present.   Pulmonary/Chest: Effort normal. Tachypnea noted.  Abdominal: Soft. She exhibits no distension. There is tenderness (mild upper).  Neurological: She is oriented to person, place, and time. She has normal strength.  She appears listless. She displays atrophy (diffuse). No cranial nerve deficit. GCS eye subscore is 4. GCS verbal subscore is 5. GCS motor subscore is 6.  Skin: Skin is warm and dry.  Psychiatric: She has a normal mood and affect.     ED Treatments / Results  Labs (all labs ordered are listed, but only abnormal results are displayed) Labs Reviewed  ETHANOL - Abnormal; Notable for the following:       Result Value   Alcohol, Ethyl (B) 177 (*)    All other components within normal  limits  CBC WITH DIFFERENTIAL/PLATELET - Abnormal; Notable for the following:    RBC 3.27 (*)    Hemoglobin 11.3 (*)    HCT 31.6 (*)    MCH 34.6 (*)    RDW 20.2 (*)    nRBC 2 (*)    All other components within normal limits  LIPASE, BLOOD - Abnormal; Notable for the following:    Lipase 65 (*)    All other components within normal limits  COMPREHENSIVE METABOLIC PANEL - Abnormal; Notable for the following:    Sodium 127 (*)    Potassium 2.4 (*)    Chloride 82 (*)    CO2 20 (*)    Glucose, Bld 134 (*)    Creatinine, Ser 0.42 (*)    Calcium 8.3 (*)    Total Protein 5.6 (*)    Albumin 2.9 (*)    AST 476 (*)    ALT 128 (*)    Alkaline Phosphatase 670 (*)    Total Bilirubin 3.5 (*)    Anion gap 25 (*)    All other components within normal limits  BLOOD GAS, ARTERIAL - Abnormal; Notable for the following:    pH, Arterial 7.457 (*)    pCO2 arterial 29.8 (*)    Acid-base deficit 2.1 (*)    All other components within normal limits  MAGNESIUM - Abnormal; Notable for the following:    Magnesium 1.6 (*)    All other components within normal limits  I-STAT CG4 LACTIC ACID, ED - Abnormal; Notable for the following:    Lactic Acid, Venous 12.93 (*)    All other components within normal limits  CULTURE, BLOOD (ROUTINE X 2)  CULTURE, BLOOD (ROUTINE X 2)  URINE CULTURE  CK  RAPID URINE DRUG SCREEN, HOSP PERFORMED  URINALYSIS, ROUTINE W REFLEX MICROSCOPIC (NOT AT [facility])  SALICYLATE LEVEL  ACETAMINOPHEN LEVEL  OSMOLALITY  OSMOLALITY, URINE    EKG  EKG Interpretation  Date/Time:  Tuesday June 29 2016 08:28:18 EDT Ventricular Rate:  124 PR Interval:    QRS Duration: 71 QT Interval:  301 QTC Calculation: 433 R Axis:   41 Text Interpretation:  Sinus tachycardia LAE, consider biatrial enlargement Borderline repolarization abnormality Since last tracing rate slower Otherwise no significant change Confirmed by Shuntavia Yerby MD, Reade Trefz [phone]) on 06/29/2016 8:38:05 AM        Radiology Dg Chest 2 View  Result Date: 06/29/2016 CLINICAL DATA:  Shortness of breath.  Smoker. EXAM: CHEST  2 VIEW COMPARISON:  06/01/2016 FINDINGS: Lateral view degraded by patient arm position. Moderate right hemidiaphragm elevation, increased. Midline trachea. Normal heart size and mediastinal contours. No pleural effusion or pneumothorax. Clear lungs. IMPRESSION: No acute cardiopulmonary disease. Increase in moderate right hemidiaphragm elevation since 06/01/2016. Electronically Signed   By: Jeronimo GreavesKyle  Talbot M.D.   On: 06/29/2016 09:31    Procedures Procedures (including critical care time)  CRITICAL CARE Performed by: Lyndal PulleyKnott, Tine Mabee Total critical care time:  30 minutes Critical care time was exclusive of separately billable procedures and treating other patients. Critical care was necessary to treat or prevent imminent or life-threatening deterioration. Critical care was time spent personally by me on the following activities: development of treatment plan with patient and/or surrogate as well as nursing, discussions with consultants, evaluation of patient's response to treatment, examination of patient, obtaining history from patient or surrogate, ordering and performing treatments and interventions, ordering and review of laboratory studies, ordering and review of radiographic studies, pulse oximetry and re-evaluation of patient's condition.   Emergency Focused Ultrasound Exam Limited Ultrasound of the Heart and Pericardium  Performed and interpreted by Dr. Clydene Pugh Indication: shortness of breath Multiple views of the heart, pericardium, and IVC are obtained with a multi frequency probe.  Findings: nml contractility, no anechoic fluid, complete IVC collapse Interpretation: nml ejection fraction, no pericardial effusion, depressed CVP Images archived electronically.  CPT Code: 16109  Medications Ordered in ED Medications  potassium chloride 10 mEq in 100 mL IVPB (10 mEq  Intravenous New Bag/Given 06/29/16 1030)  magnesium sulfate IVPB 2 g 50 mL (not administered)  sodium chloride 0.9 % bolus 1,000 mL (1,000 mLs Intravenous New Bag/Given 06/29/16 0933)    And  sodium chloride 0.9 % bolus 1,000 mL (1,000 mLs Intravenous New Bag/Given 06/29/16 0934)  thiamine (B-1) injection 100 mg (100 mg Intravenous Given 06/29/16 0931)  folic acid (FOLVITE) tablet 1 mg (1 mg Oral Given 06/29/16 0931)     Initial Impression / Assessment and Plan / ED Course  I have reviewed the triage vital signs and the nursing notes.  Pertinent labs & imaging results that were available during my care of the patient were reviewed by me and considered in my medical decision making (see chart for details).  Clinical Course    50 y.o. female presents with shortness of breath that has been progressing. She has been depressed and drinking a bottle of liquor daily. Recent treatment for CAP after negative workup for PE but symptoms appear to have progressed. She appears dehydrated and has severely elevated lactate, acidosis with respiratory compensation and likely alcoholic ketosis with acute liver injury. Aggressive IVF resuscitation started. Will admit for monitoring, CIWA, and IVF rehydration therapy. Workup initiated for recurrent infection but does not have fever so this is lower on differential. Electrolytes repleted and given vitamins pending admission. Hospitalist was consulted for admission and will see the patient in the emergency department.   Final Clinical Impressions(s) / ED Diagnoses   Final diagnoses:  Metabolic acidosis with respiratory alkalosis  Alcoholic hepatitis without ascites  Acute hypokalemia  Acute hyponatremia    New Prescriptions New Prescriptions   No medications on file     Lyndal Pulley, MD 06/29/16 1934

## 2016-06-29 NOTE — Progress Notes (Signed)
Pt given a list of in network internal medicine united health care provider within pt zip code to assist with f/u care after d/c  Cm left list with her mother - 1 of 2 female visitors

## 2016-06-29 NOTE — H&P (Addendum)
History and Physical    THOMASENIA DOWSE WLN:[id number] DOB: [birth year] DOA: 06/29/2016    PCP: No PCP Per Patient  Patient coming from: home  Chief Complaint: cough, dyspnea and weakness  HPI: Virginia Hess is a 50 y.o. female with medical history significant of ETOH abuse who has been drinking heavily for 2 wks now due to depression. She presents for a cough with clear sputum x 2-3 wks. She was treated for RLL pneumonia around 9/26 after being seen in the ER but symptoms did not improve. No chest pain. + dyspnea on exertion. She also complains of weakness in lower extremities for a few days now. No pain.  She states she quit drinking for 7 yrs and restarted at end of May this year. Found to have a UTI- admits to burning with urination & urgency. No hematuria. No chills or fever.  ED Course:  Na 127, K 2.4, Cl 82, CO2, 20, Ca 8.3, Alb 2.9 Alk Ph 670, Lipase 65, AST 476, ALT 128, Tbili 3.5- Mg 1.6 LA 12.9 Hb 11.3, HCT 31.6 ETOH 177   Review of Systems: Wt loss of about 40 lbs in 3 wks Loose stool 1 x day- no abd pain or vomiting  Severe depression- no anxiety or panic attacks. Easy bruising All other systems reviewed and apart from HPI, are negative.  History reviewed. No pertinent past medical history.  History reviewed. No pertinent surgical history.  Social History:   reports that she has been smoking Cigarettes.  She has a 10.00 pack-year smoking history. She has never used smokeless tobacco. She reports that she drinks alcohol. She reports that she does not use drugs. Smoking 1 cigarette/ day- smoking since age age 2 1/2 bottle of gin / day for about 2 wks-no drug use.   No Known Allergies  family history- mother has HTN, DM   Prior to Admission medications   Medication Sig Start Date End Date Taking? Authorizing Provider  albuterol (PROVENTIL HFA;VENTOLIN HFA) 108 (90 Base) MCG/ACT inhaler Inhale 2 puffs into the lungs every 4 (four) hours as needed for  wheezing or shortness of breath. 06/02/16  Yes Jola Schmidt, MD  azithromycin (ZITHROMAX) 250 MG tablet Take 1 tablet (250 mg total) by mouth daily. Take first 2 tablets together, then 1 every day until finished. Patient not taking: Reported on 06/29/2016 04/27/16   Lysbeth Penner, FNP  benzonatate (TESSALON) 100 MG capsule Take 2 capsules (200 mg total) by mouth 3 (three) times daily as needed for cough. Patient not taking: Reported on 06/29/2016 04/27/16   Lysbeth Penner, FNP  levofloxacin (LEVAQUIN) 500 MG tablet Take 1 tablet (500 mg total) by mouth daily. Patient not taking: Reported on 06/29/2016 06/02/16   Jola Schmidt, MD  predniSONE (DELTASONE) 10 MG tablet Take 6 tablets (60 mg total) by mouth daily. Patient not taking: Reported on 06/29/2016 06/02/16   Jola Schmidt, MD    Physical Exam: Vitals:   06/29/16 0930 06/29/16 1000 06/29/16 1028 06/29/16 1100  BP: 139/99 155/97 155/97 155/97  Pulse:  110 109 111  Resp:  15 18 21   Temp:      SpO2:  99% 99% 100%      Constitutional: NAD, calm, comfortable Eyes: PERTLA, lids and conjunctivae normal ENMT: Mucous membranes are moist. Posterior pharynx clear of any exudate or lesions. poor dentition.  Neck: normal, supple, no masses, no thyromegaly Respiratory: clear to auscultation bilaterally, no wheezing, no crackles. Normal respiratory effort. No accessory muscle use.  Cardiovascular: S1 & S2 heard, regular rate and rhythm, no murmurs / rubs / gallops. No extremity edema. 2+ pedal pulses. No carotid bruits.  Abdomen: No distension, + mild diffuse tenderness, no masses palpated. No hepatosplenomegaly. Bowel sounds normal.  Musculoskeletal: no clubbing / cyanosis. No joint deformity upper and lower extremities. Good ROM, no contractures. Normal muscle tone.  Skin: no rashes, lesions, ulcers. No induration Neurologic: CN 2-12 grossly intact. Sensation intact, DTR normal. Strength 5/5 in all 4 limbs.  Psychiatric: Normal judgment and  insight. Alert and oriented x 3. Normal mood.     Labs on Admission: I have personally reviewed following labs and imaging studies  CBC:  Recent Labs Lab 06/29/16 0919  WBC 8.6  NEUTROABS 6.0  HGB 11.3*  HCT 31.6*  MCV 96.6  PLT 967   Basic Metabolic Panel:  Recent Labs Lab 06/29/16 0919 06/29/16 0925  NA 127*  --   K 2.4*  --   CL 82*  --   CO2 20*  --   GLUCOSE 134*  --   BUN 9  --   CREATININE 0.42*  --   CALCIUM 8.3*  --   MG  --  1.6*   GFR: CrCl cannot be calculated (Unknown ideal weight.). Liver Function Tests:  Recent Labs Lab 06/29/16 0919  AST 476*  ALT 128*  ALKPHOS 670*  BILITOT 3.5*  PROT 5.6*  ALBUMIN 2.9*    Recent Labs Lab 06/29/16 0919  LIPASE 65*   No results for input(s): AMMONIA in the last 168 hours. Coagulation Profile:  Recent Labs Lab 06/29/16 0919  INR 1.06   Cardiac Enzymes:  Recent Labs Lab 06/29/16 0919  CKTOTAL 76   BNP (last 3 results) No results for input(s): PROBNP in the last 8760 hours. HbA1C: No results for input(s): HGBA1C in the last 72 hours. CBG: No results for input(s): GLUCAP in the last 168 hours. Lipid Profile: No results for input(s): CHOL, HDL, LDLCALC, TRIG, CHOLHDL, LDLDIRECT in the last 72 hours. Thyroid Function Tests: No results for input(s): TSH, T4TOTAL, FREET4, T3FREE, THYROIDAB in the last 72 hours. Anemia Panel: No results for input(s): VITAMINB12, FOLATE, FERRITIN, TIBC, IRON, RETICCTPCT in the last 72 hours. Urine analysis:    Component Value Date/Time   COLORURINE AMBER (A) 06/29/2016 1036   APPEARANCEUR CLOUDY (A) 06/29/2016 1036   LABSPEC 1.019 06/29/2016 1036   PHURINE 6.0 06/29/2016 1036   GLUCOSEU NEGATIVE 06/29/2016 1036   HGBUR SMALL (A) 06/29/2016 1036   BILIRUBINUR SMALL (A) 06/29/2016 1036   KETONESUR NEGATIVE 06/29/2016 1036   PROTEINUR NEGATIVE 06/29/2016 1036   UROBILINOGEN 1.0 10/26/2007 1330   NITRITE NEGATIVE 06/29/2016 1036   LEUKOCYTESUR LARGE (A)  06/29/2016 1036   Sepsis Labs: @LABRCNTIP (procalcitonin:4,lacticidven:4) )No results found for this or any previous visit (from the past 240 hour(s)).   Radiological Exams on Admission: Dg Chest 2 View  Result Date: 06/29/2016 CLINICAL DATA:  Shortness of breath.  Smoker. EXAM: CHEST  2 VIEW COMPARISON:  06/01/2016 FINDINGS: Lateral view degraded by patient arm position. Moderate right hemidiaphragm elevation, increased. Midline trachea. Normal heart size and mediastinal contours. No pleural effusion or pneumothorax. Clear lungs. IMPRESSION: No acute cardiopulmonary disease. Increase in moderate right hemidiaphragm elevation since 06/01/2016. Electronically Signed   By: Abigail Miyamoto M.D.   On: 06/29/2016 09:31    EKG: Independently reviewed. Sinus tach at 124 bpm  Assessment/Plan Active Problems: Multiple electrolyte abnormalities - due to alcohol abuse and poor oral intake - replace K, Mg,  Sodium - recheck later today - check phos- watch for re-feeding syndrome    Lactic acidosis - 2 NS boluses given in ER - cont IVF- recheck    ETOH abuse - CIWA scale ordered - social work consult    Elevated LFTs - due to ETOH abuse - check hepatitis panel and PT, PTT    UTI (urinary tract infection) - Vantin 200 Q 12  Cough - likely developing chronic bronchitis vs allergies vs post infectious due to recent pneumonia - Duonebs Q 6, Mucinex DM, Tessalon. Claritin  Anemia - check anemia panel, hemoccult stool    Depressed  - states she does not want to see a psychiatrist while here  Weight loss - Glucerna, nutrition eval    DVT prophylaxis: Full code Code Status: Full code  Family Communication: she does not want hospital staff to talk to family members unless there is an emergency and then we are to contact her mother Disposition Plan: telemetry Consults called:   Admission status: admit    [facility] MD [facility] Pager: [email or web address] Password  [email or web address] 7PM-7AM, please contact night-coverage   06/29/2016, 12:14 PM

## 2016-06-29 NOTE — ED Notes (Signed)
Pt transported to Xray. 

## 2016-06-29 NOTE — ED Notes (Signed)
Pt can go to floor at 14:30 

## 2016-06-29 NOTE — ED Notes (Signed)
In and out cath not needed, pt used a bedpan

## 2016-06-29 NOTE — ED Notes (Signed)
Lyla Sonarrie, RN aware of critical lactic acid- 12.93

## 2016-06-29 NOTE — ED Triage Notes (Signed)
Reports was diagnosed with walking pneumonia a couple months ago, and reports SOB for the past couple weeks. States weakness and numbness in the lower part of both legs. States has been on an inhaler and says that it helps somewhat but doesn't last.Denies any chest pain, reports occasional pain when taking deep breaths

## 2016-06-30 DIAGNOSIS — E876 Hypokalemia: Secondary | ICD-10-CM

## 2016-06-30 DIAGNOSIS — R7989 Other specified abnormal findings of blood chemistry: Secondary | ICD-10-CM

## 2016-06-30 DIAGNOSIS — K7010 Alcoholic hepatitis without ascites: Secondary | ICD-10-CM

## 2016-06-30 LAB — BASIC METABOLIC PANEL
Anion gap: 8 (ref 5–15)
BUN: 7 mg/dL (ref 6–20)
CALCIUM: 7.8 mg/dL — AB (ref 8.9–10.3)
CHLORIDE: 96 mmol/L — AB (ref 101–111)
CO2: 23 mmol/L (ref 22–32)
CREATININE: 0.42 mg/dL — AB (ref 0.44–1.00)
GFR calc non Af Amer: >60 mL/min (ref >60)
GLUCOSE: 131 mg/dL — AB (ref 65–99)
Potassium: 3.6 mmol/L (ref 3.5–5.1)
Sodium: 127 mmol/L — ABNORMAL LOW (ref 135–145)

## 2016-06-30 LAB — LACTIC ACID, PLASMA: Lactic Acid, Venous: 3.8 mmol/L (ref 0.5–1.9)

## 2016-06-30 LAB — URINE CULTURE

## 2016-06-30 LAB — COMPREHENSIVE METABOLIC PANEL
ALBUMIN: 2.4 g/dL — AB (ref 3.5–5.0)
ALK PHOS: 542 U/L — AB (ref 38–126)
ALT: 106 U/L — ABNORMAL HIGH (ref 14–54)
AST: 547 U/L — ABNORMAL HIGH (ref 15–41)
Anion gap: 5 (ref 5–15)
BILIRUBIN TOTAL: 4.1 mg/dL — AB (ref 0.3–1.2)
BUN: 8 mg/dL (ref 6–20)
CALCIUM: 7.3 mg/dL — AB (ref 8.9–10.3)
CO2: 26 mmol/L (ref 22–32)
Chloride: 98 mmol/L — ABNORMAL LOW (ref 101–111)
Creatinine, Ser: 0.34 mg/dL — ABNORMAL LOW (ref 0.44–1.00)
GFR calc non Af Amer: >60 mL/min (ref >60)
GLUCOSE: 92 mg/dL (ref 65–99)
POTASSIUM: 2.9 mmol/L — AB (ref 3.5–5.1)
Sodium: 129 mmol/L — ABNORMAL LOW (ref 135–145)
TOTAL PROTEIN: 4.3 g/dL — AB (ref 6.5–8.1)

## 2016-06-30 LAB — CBC
HEMATOCRIT: 22.7 % — AB (ref 36.0–46.0)
Hemoglobin: 8.1 g/dL — ABNORMAL LOW (ref 12.0–15.0)
MCH: 34.8 pg — AB (ref 26.0–34.0)
MCHC: 35.7 g/dL (ref 30.0–36.0)
MCV: 97.4 fL (ref 78.0–100.0)
Platelets: 130 10*3/uL — ABNORMAL LOW (ref 150–400)
RBC: 2.33 MIL/uL — ABNORMAL LOW (ref 3.87–5.11)
RDW: 20.7 % — AB (ref 11.5–15.5)
WBC: 5.7 10*3/uL (ref 4.0–10.5)

## 2016-06-30 LAB — HEPATITIS PANEL, ACUTE
HEP A IGM: NEGATIVE
Hep B C IgM: NEGATIVE
Hepatitis B Surface Ag: NEGATIVE

## 2016-06-30 LAB — GLUCOSE, CAPILLARY: GLUCOSE-CAPILLARY: 102 mg/dL — AB (ref 65–99)

## 2016-06-30 LAB — PHOSPHORUS: Phosphorus: <1 mg/dL — CL (ref 2.5–4.6)

## 2016-06-30 LAB — MAGNESIUM: MAGNESIUM: 1.6 mg/dL — AB (ref 1.7–2.4)

## 2016-06-30 MED ORDER — POTASSIUM PHOSPHATES 15 MMOLE/5ML IV SOLN
30.0000 mmol | Freq: Once | INTRAVENOUS | Status: AC
  Administered 2016-06-30: 30 mmol via INTRAVENOUS
  Filled 2016-06-30: qty 10

## 2016-06-30 MED ORDER — MAGNESIUM SULFATE 50 % IJ SOLN
3.0000 g | Freq: Once | INTRAVENOUS | Status: AC
  Administered 2016-06-30: 3 g via INTRAVENOUS
  Filled 2016-06-30: qty 6

## 2016-06-30 MED ORDER — POTASSIUM CHLORIDE CRYS ER 20 MEQ PO TBCR
40.0000 meq | EXTENDED_RELEASE_TABLET | Freq: Three times a day (TID) | ORAL | Status: AC
  Administered 2016-06-30 (×3): 40 meq via ORAL
  Filled 2016-06-30 (×3): qty 2

## 2016-06-30 MED ORDER — SODIUM CHLORIDE 0.9 % IV BOLUS (SEPSIS)
1000.0000 mL | Freq: Once | INTRAVENOUS | Status: AC
  Administered 2016-06-30: 1000 mL via INTRAVENOUS

## 2016-06-30 NOTE — Progress Notes (Signed)
CRITICAL VALUE ALERT  Critical value received:  Phosphorus < 1  Date of notification:  06/30/13  Time of notification:  0602  Critical value read back:Yes.    Nurse who received alert:  Stacie AcresJ. Kahron Kauth  MD notified (1st page):  Kirtland BouchardK. Schorr  Time of first page:  0603  MD notified (2nd page):  Time of second page:  Responding MD:  Time MD responded:

## 2016-06-30 NOTE — Progress Notes (Signed)
PROGRESS NOTE    Virginia Hess  ZOX:[id number] DOB: 20-Oct-1965 DOA: 06/29/2016 PCP: No PCP Per Patient    Brief Narrative: Virginia Hess is a 50 y.o. female with medical history significant of ETOH abuse who has been drinking heavily for 2 wks due to depression, presents with sob, cough and generalized weakness,. On arrival she was found to have several electrolyte disturbances.   Assessment & Plan:   Active Problems:   ETOH abuse   Hypokalemia   Hypomagnesemia   Lactic acid acidosis   Elevated LFTs   UTI (urinary tract infection)   Depressed   ETOH abuse: On CIWA ,  Watch for signs of withdrawal.  Mild elevated liver function tests.    Lactic acidosis: Probably from alcohol intoxication . Improved to 3.8. Repeat level tonight.   Hypokalemia: Replete as needed.   Hypophosphatemia: Replete and repeat in am.   Hypomagnesemia:  Replete as needed.   Hyponatremia: dehydration and decreased po intake.  IV hydration and repeat in am.     DVT prophylaxis: (Lovenox/) Code Status: (Full/) Family Communication: mother at bedside.  Disposition Plan: pending PT evaluation.    Consultants:   None.    Procedures: none.    Antimicrobials: none.    Subjective: Reports nauseated, no sleep, anxious.   Objective: Vitals:   06/30/16 0100 06/30/16 0230 06/30/16 0647 06/30/16 1323  BP:   123/87 124/73  Pulse: (!) 140 (!) 115 (!) 120 (!) 122  Resp:   18 18  Temp:   98.9 F (37.2 C) 98.5 F (36.9 C)  TempSrc:   Oral Oral  SpO2:   100% 97%  Weight:      Height:        Intake/Output Summary (Last 24 hours) at 06/30/16 1838 Last data filed at 06/30/16 1800  Gross per 24 hour  Intake             2241 ml  Output                3 ml  Net             2238 ml   Filed Weights   06/29/16 1722 06/29/16 1728  Weight: 57.9 kg (127 lb 11.2 oz) 57.9 kg (127 lb 10.3 oz)    Examination:  General exam: Appears anxious.  Respiratory system: Clear to  auscultation. Respiratory effort normal. Cardiovascular system: S1 & S2 heard, RRR. No JVD, murmurs, rubs, gallops or clicks. No pedal edema. Gastrointestinal system: Abdomen is nondistended, soft and nontender. No organomegaly or masses felt. Normal bowel sounds heard. Central nervous system: Alert and oriented. No focal neurological deficits. Extremities: Symmetric 5 x 5 power. Skin: No rashes, lesions or ulcers Psychiatry: very anxious. .     Data Reviewed: I have personally reviewed following labs and imaging studies  CBC:  Recent Labs Lab 06/29/16 0919 06/29/16 1238 06/30/16 0511  WBC 8.6 7.2 5.7  NEUTROABS 6.0  --   --   HGB 11.3* 9.3* 8.1*  HCT 31.6* 25.9* 22.7*  MCV 96.6 95.9 97.4  PLT 202 179 130*   Basic Metabolic Panel:  Recent Labs Lab 06/29/16 0919 06/29/16 0925 06/29/16 1238 06/29/16 1839 06/30/16 0511  NA 127*  --   --  129* 129*  K 2.4*  --   --  3.0* 2.9*  CL 82*  --   --  93* 98*  CO2 20*  --   --  24 26  GLUCOSE 134*  --   --  115* 92  BUN 9  --   --  8 8  CREATININE 0.42*  --  <0.30* 0.37* 0.34*  CALCIUM 8.3*  --   --  7.4* 7.3*  MG  --  1.6*  --  1.9 1.6*  PHOS  --   --  1.6*  --  <1.0*   GFR: Estimated Creatinine Clearance: 72.6 mL/min (by C-G formula based on SCr of 0.34 mg/dL (L)). Liver Function Tests:  Recent Labs Lab 06/29/16 0919 06/30/16 0511  AST 476* 547*  ALT 128* 106*  ALKPHOS 670* 542*  BILITOT 3.5* 4.1*  PROT 5.6* 4.3*  ALBUMIN 2.9* 2.4*    Recent Labs Lab 06/29/16 0919  LIPASE 65*   No results for input(s): AMMONIA in the last 168 hours. Coagulation Profile:  Recent Labs Lab 06/29/16 0919  INR 1.06   Cardiac Enzymes:  Recent Labs Lab 06/29/16 0919  CKTOTAL 76   BNP (last 3 results) No results for input(s): PROBNP in the last 8760 hours. HbA1C: No results for input(s): HGBA1C in the last 72 hours. CBG:  Recent Labs Lab 06/30/16 0640  GLUCAP 102*   Lipid Profile: No results for input(s):  CHOL, HDL, LDLCALC, TRIG, CHOLHDL, LDLDIRECT in the last 72 hours. Thyroid Function Tests: No results for input(s): TSH, T4TOTAL, FREET4, T3FREE, THYROIDAB in the last 72 hours. Anemia Panel:  Recent Labs  06/29/16 1238  VITAMINB12 946*  FOLATE 18.7  FERRITIN 5,230*  TIBC 122*  IRON 118  RETICCTPCT 2.4   Sepsis Labs:  Recent Labs Lab 06/29/16 0929 06/30/16 1039  LATICACIDVEN 12.93* 3.8*    Recent Results (from the past 240 hour(s))  Urine culture     Status: Abnormal   Collection Time: 06/29/16 10:36 AM  Result Value Ref Range Status   Specimen Description URINE, RANDOM  Final   Special Requests NONE  Final   Culture MULTIPLE SPECIES PRESENT, SUGGEST RECOLLECTION (A)  Final   Report Status 06/30/2016 FINAL  Final  Blood culture (routine x 2)     Status: None (Preliminary result)   Collection Time: 06/29/16 11:01 AM  Result Value Ref Range Status   Specimen Description BLOOD LEFT HAND  Final   Special Requests BOTTLES DRAWN AEROBIC AND ANAEROBIC 4.5ML  Final   Culture   Final    NO GROWTH 1 DAY [facility]    Report Status PENDING  Incomplete  Blood culture (routine x 2)     Status: None (Preliminary result)   Collection Time: 06/29/16 11:08 AM  Result Value Ref Range Status   Specimen Description BLOOD LEFT ANTECUBITAL  Final   Special Requests IN PEDIATRIC BOTTLE 3ML  Final   Culture   Final    NO GROWTH 1 DAY [facility]    Report Status PENDING  Incomplete         Radiology Studies: Dg Chest 2 View  Result Date: 06/29/2016 CLINICAL DATA:  Shortness of breath.  Smoker. EXAM: CHEST  2 VIEW COMPARISON:  06/01/2016 FINDINGS: Lateral view degraded by patient arm position. Moderate right hemidiaphragm elevation, increased. Midline trachea. Normal heart size and mediastinal contours. No pleural effusion or pneumothorax. Clear lungs. IMPRESSION: No acute cardiopulmonary disease. Increase in moderate right hemidiaphragm  elevation since 06/01/2016. Electronically Signed   By: Jeronimo GreavesKyle  Talbot M.D.   On: 06/29/2016 09:31        Scheduled Meds: . benzonatate  100 mg Oral TID  . cefpodoxime  200 mg Oral Q12H  .  dextromethorphan-guaiFENesin  1 tablet Oral BID  . enoxaparin (LOVENOX) injection  40 mg Subcutaneous Q24H  . feeding supplement (ENSURE ENLIVE)  237 mL Oral BID BM  . folic acid  1 mg Oral Daily  . loratadine  10 mg Oral Daily  . multivitamin with minerals  1 tablet Oral Daily  . potassium chloride  40 mEq Oral TID  . thiamine  100 mg Oral Daily   Or  . thiamine  100 mg Intravenous Daily   Continuous Infusions:    LOS: 1 day    Time spent: 30 minutes.     Kathlen Mody, MD [facility] Pager [phone]  If 7PM-7AM, please contact night-coverage [email or web address] Password [facility] 06/30/2016, 6:38 PM

## 2016-06-30 NOTE — Progress Notes (Signed)
Pt HR to 140s, on-call paged. 1000 cc bolus ordered, HR down to 115 after administration. Will continue to monitor closely. Littie DeedsJulia R Eura Radabaugh, RN

## 2016-07-01 DIAGNOSIS — F1010 Alcohol abuse, uncomplicated: Secondary | ICD-10-CM

## 2016-07-01 LAB — BASIC METABOLIC PANEL
ANION GAP: 4 — AB (ref 5–15)
ANION GAP: 7 (ref 5–15)
BUN: 5 mg/dL — ABNORMAL LOW (ref 6–20)
CALCIUM: 7.5 mg/dL — AB (ref 8.9–10.3)
CO2: 26 mmol/L (ref 22–32)
CO2: 25 mmol/L (ref 22–32)
CREATININE: 0.33 mg/dL — AB (ref 0.44–1.00)
Calcium: 7.4 mg/dL — ABNORMAL LOW (ref 8.9–10.3)
Chloride: 98 mmol/L — ABNORMAL LOW (ref 101–111)
Chloride: 95 mmol/L — ABNORMAL LOW (ref 101–111)
Creatinine, Ser: 0.36 mg/dL — ABNORMAL LOW (ref 0.44–1.00)
GFR calc non Af Amer: >60 mL/min (ref >60)
GLUCOSE: 95 mg/dL (ref 65–99)
Glucose, Bld: 85 mg/dL (ref 65–99)
POTASSIUM: 3.9 mmol/L (ref 3.5–5.1)
POTASSIUM: 3.3 mmol/L — AB (ref 3.5–5.1)
Sodium: 128 mmol/L — ABNORMAL LOW (ref 135–145)
Sodium: 127 mmol/L — ABNORMAL LOW (ref 135–145)

## 2016-07-01 LAB — LACTIC ACID, PLASMA: LACTIC ACID, VENOUS: 1.9 mmol/L (ref 0.5–1.9)

## 2016-07-01 LAB — MAGNESIUM: Magnesium: 1.7 mg/dL (ref 1.7–2.4)

## 2016-07-01 LAB — PHOSPHORUS: Phosphorus: 2.8 mg/dL (ref 2.5–4.6)

## 2016-07-01 MED ORDER — VITAMIN B-1 100 MG PO TABS
250.0000 mg | ORAL_TABLET | Freq: Every day | ORAL | Status: DC
  Administered 2016-07-02 – 2016-07-05 (×4): 250 mg via ORAL
  Filled 2016-07-01 (×4): qty 3

## 2016-07-01 MED ORDER — POTASSIUM & SODIUM PHOSPHATES 280-160-250 MG PO PACK
1.0000 | PACK | Freq: Three times a day (TID) | ORAL | Status: DC
  Administered 2016-07-01 – 2016-07-05 (×17): 1 via ORAL
  Filled 2016-07-01 (×19): qty 1

## 2016-07-01 MED ORDER — MAGNESIUM SULFATE 2 GM/50ML IV SOLN
2.0000 g | Freq: Once | INTRAVENOUS | Status: AC
  Administered 2016-07-01: 2 g via INTRAVENOUS
  Filled 2016-07-01: qty 50

## 2016-07-01 MED ORDER — SODIUM PHOSPHATES 45 MMOLE/15ML IV SOLN
30.0000 mmol | Freq: Once | INTRAVENOUS | Status: AC
  Administered 2016-07-01: 30 mmol via INTRAVENOUS
  Filled 2016-07-01: qty 10

## 2016-07-01 NOTE — Progress Notes (Signed)
PROGRESS NOTE    Virginia Hess  YNW:[id number] DOB: 1966-03-27 DOA: 06/29/2016 PCP: No PCP Per Patient    Brief Narrative: Virginia Hess is a 50 y.o. female with medical history significant of ETOH abuse who has been drinking heavily for 2 wks due to depression, presents with sob, cough and generalized weakness,. On arrival she was found to have several electrolyte disturbances.   Assessment & Plan:   Active Problems:   ETOH abuse   Hypokalemia   Hypomagnesemia   Lactic acid acidosis   Elevated LFTs   UTI (urinary tract infection)   Depressed   ETOH abuse: On CIWA ,  Watch for signs of withdrawal.  Mild elevated liver function tests.    Lactic acidosis: Probably from alcohol intoxication . Improved to 3.8. Repeat level IS 1.9.  Hypokalemia: Replete as needed.   Hypophosphatemia: Replete and repeat in am.   Hypocalcemia:  Replete as needed.   Hypomagnesemia:  Replete as needed.   Hyponatremia: dehydration and decreased po intake.  IV hydration and repeat in am.   Anemia: normocytic.  Hemoglobin is stable around 8.1.  Repeat in am.  Stool for occult blood negative.   ? Refeeding syndrome: replete electrolytes as needed.  Dietary consult appreciated. Increased the dose of thiamine.     DVT prophylaxis: (Lovenox/) Code Status: (Full/) Family Communication: mother at bedside.  Disposition Plan: pending PT evaluation.    Consultants:   None.    Procedures: none.    Antimicrobials: none.    Subjective: No new complaints.   Objective: Vitals:   06/30/16 1800 06/30/16 2223 07/01/16 0616 07/01/16 1456  BP: 120/70 130/83 116/69 114/72  Pulse: (!) 136 (!) 135 (!) 118 (!) 120  Resp:  15 16 20   Temp:  98.7 F (37.1 C) 98.7 F (37.1 C) 98.2 F (36.8 C)  TempSrc:  Oral Oral Oral  SpO2:  98% 98% 99%  Weight:      Height:        Intake/Output Summary (Last 24 hours) at 07/01/16 1722 Last data filed at 07/01/16 1500  Gross per 24 hour   Intake             1610 ml  Output              100 ml  Net             1510 ml   Filed Weights   06/29/16 1722 06/29/16 1728  Weight: 57.9 kg (127 lb 11.2 oz) 57.9 kg (127 lb 10.3 oz)    Examination:  General exam: Appears anxious.  Respiratory system: Clear to auscultation. Respiratory effort normal. Cardiovascular system: S1 & S2 heard, RRR. No JVD, murmurs, rubs, gallops or clicks. No pedal edema. Gastrointestinal system: Abdomen is nondistended, soft and nontender. No organomegaly or masses felt. Normal bowel sounds heard. Central nervous system: Alert and oriented. No focal neurological deficits. Extremities: Symmetric 5 x 5 power. Skin: No rashes, lesions or ulcers Psychiatry: very anxious. .     Data Reviewed: I have personally reviewed following labs and imaging studies  CBC:  Recent Labs Lab 06/29/16 0919 06/29/16 1238 06/30/16 0511  WBC 8.6 7.2 5.7  NEUTROABS 6.0  --   --   HGB 11.3* 9.3* 8.1*  HCT 31.6* 25.9* 22.7*  MCV 96.6 95.9 97.4  PLT 202 179 130*   Basic Metabolic Panel:  Recent Labs Lab 06/29/16 0919 06/29/16 0925 06/29/16 1238 06/29/16 1839 06/30/16 0511 06/30/16 1905 07/01/16 0623  NA  127*  --   --  129* 129* 127* 128*  K 2.4*  --   --  3.0* 2.9* 3.6 3.9  CL 82*  --   --  93* 98* 96* 98*  CO2 20*  --   --  24 26 23 26   GLUCOSE 134*  --   --  115* 92 131* 85  BUN 9  --   --  8 8 7  5*  CREATININE 0.42*  --  <0.30* 0.37* 0.34* 0.42* 0.36*  CALCIUM 8.3*  --   --  7.4* 7.3* 7.8* 7.5*  MG  --  1.6*  --  1.9 1.6*  --  1.7  PHOS  --   --  1.6*  --  <1.0* <1.0*  --    GFR: Estimated Creatinine Clearance: 72.6 mL/min (by C-G formula based on SCr of 0.36 mg/dL (L)). Liver Function Tests:  Recent Labs Lab 06/29/16 0919 06/30/16 0511  AST 476* 547*  ALT 128* 106*  ALKPHOS 670* 542*  BILITOT 3.5* 4.1*  PROT 5.6* 4.3*  ALBUMIN 2.9* 2.4*    Recent Labs Lab 06/29/16 0919  LIPASE 65*   No results for input(s): AMMONIA in the last  168 hours. Coagulation Profile:  Recent Labs Lab 06/29/16 0919  INR 1.06   Cardiac Enzymes:  Recent Labs Lab 06/29/16 0919  CKTOTAL 76   BNP (last 3 results) No results for input(s): PROBNP in the last 8760 hours. HbA1C: No results for input(s): HGBA1C in the last 72 hours. CBG:  Recent Labs Lab 06/30/16 0640  GLUCAP 102*   Lipid Profile: No results for input(s): CHOL, HDL, LDLCALC, TRIG, CHOLHDL, LDLDIRECT in the last 72 hours. Thyroid Function Tests: No results for input(s): TSH, T4TOTAL, FREET4, T3FREE, THYROIDAB in the last 72 hours. Anemia Panel:  Recent Labs  06/29/16 1238  VITAMINB12 946*  FOLATE 18.7  FERRITIN 5,230*  TIBC 122*  IRON 118  RETICCTPCT 2.4   Sepsis Labs:  Recent Labs Lab 06/29/16 0929 06/30/16 1039 07/01/16 1034  LATICACIDVEN 12.93* 3.8* 1.9    Recent Results (from the past 240 hour(s))  Urine culture     Status: Abnormal   Collection Time: 06/29/16 10:36 AM  Result Value Ref Range Status   Specimen Description URINE, RANDOM  Final   Special Requests NONE  Final   Culture MULTIPLE SPECIES PRESENT, SUGGEST RECOLLECTION (A)  Final   Report Status 06/30/2016 FINAL  Final  Blood culture (routine x 2)     Status: None (Preliminary result)   Collection Time: 06/29/16 11:01 AM  Result Value Ref Range Status   Specimen Description BLOOD LEFT HAND  Final   Special Requests BOTTLES DRAWN AEROBIC AND ANAEROBIC 4.5ML  Final   Culture   Final    NO GROWTH 2 DAYS Performed at [facility]    Report Status PENDING  Incomplete  Blood culture (routine x 2)     Status: None (Preliminary result)   Collection Time: 06/29/16 11:08 AM  Result Value Ref Range Status   Specimen Description BLOOD LEFT ANTECUBITAL  Final   Special Requests IN PEDIATRIC BOTTLE 3ML  Final   Culture   Final    NO GROWTH 2 DAYS [facility]formed at [facility]    Report Status PENDING  Incomplete         Radiology Studies: No results  found.      Scheduled Meds: . benzonatate  100 mg Oral TID  . cefpodoxime  200 mg Oral Q12H  .  dextromethorphan-guaiFENesin  1 tablet Oral BID  . enoxaparin (LOVENOX) injection  40 mg Subcutaneous Q24H  . feeding supplement (ENSURE ENLIVE)  237 mL Oral BID BM  . folic acid  1 mg Oral Daily  . loratadine  10 mg Oral Daily  . magnesium sulfate 1 - 4 g bolus IVPB  2 g Intravenous Once  . multivitamin with minerals  1 tablet Oral Daily  . potassium & sodium phosphates  1 packet Oral TID WC & HS  . [START ON 07/02/2016] thiamine  250 mg Oral Daily   Continuous Infusions:    LOS: 2 days    Time spent: 30 minutes.     Kathlen Mody, MD [facility] Pager [phone]  If 7PM-7AM, please contact night-coverage [email or web address] Password [email or web address] 07/01/2016, 5:22 PM

## 2016-07-01 NOTE — Progress Notes (Signed)
Initial Nutrition Assessment  DOCUMENTATION CODES:   Severe malnutrition in context of acute illness/injury  INTERVENTION:  Patient's electrolyte disturbances are likely due to Refeeding Syndrome. She may benefit from increased dose of thiamine - recommend 200 mg daily. Continue monitoring magnesium, potassium, and phosphorus daily, MD to replete as needed, as pt is at risk for refeeding syndrome given severe acute malnutrition and low phosphorus, magnesium, and potassium.  Continue Ensure Enlive po BID, each supplement provides 350 kcal and 20 grams of protein.  Will modify diet order so meats are chopped and provided with gravy.  RD will order snacks to come BID between meals for patient.  NUTRITION DIAGNOSIS:   Malnutrition (Severe) related to acute illness, social / environmental circumstances as evidenced by energy intake < or equal to 50% for > or equal to 5 days, 21 percent weight loss over 1 month.  GOAL:   Patient will meet greater than or equal to 90% of their needs  MONITOR:   PO intake, Supplement acceptance, Labs, Weight trends, I & O's  REASON FOR ASSESSMENT:   Consult Assessment of nutrition requirement/status  ASSESSMENT:   50 y.o.femalewith medical history significant of ETOH abuse who has been drinking heavily for 2 wks due to depression, presents with sob, cough and generalized weakness,. On arrival she was found to have several electrolyte disturbances.    Patient reports her appetite is getting better now, but had been poor for 3 weeks PTA. During that time she was experiencing early satiety and nausea. Denies abdominal pain. She reports she has been having diarrhea in the hospital. Patient endorses difficulty chewing meats and difficulty swallowing pills. During the 3 weeks PTA she was only able to eat 1 meal per day, and she usually had 3 meals per day with a snack. Reports UBW was 160 lbs and that she has lot a lot of weight. Per chart, patient has lost  33 lbs (21% body weight) over 1 month, which is significant for time frame. She reports that she ate better this morning than she has, and also had 3 Ensure Enlive yesterday.  Meal Completion: 10% per chart since admission  Medications reviewed and include: folic acid 1 mg daily, multivitamin with minerals daily, thiamine 100 mg daily, potassium and sodium phosphate 1 packet TID with meals (250 mg elemental phosphorus per packet), sodium phosphate 30 mmol in D5 250 ml one time today.  Labs reviewed: Sodium 128, Chloride 98, BUN 5, Creatinine 0.36, Anion gap 4. Magnesium and Potassium WNL today. Phosphorus was extremely low <1.0 on 10/25 - was not checked today.  Nutrition-Focused physical exam completed. Findings are no fat depletion, mild muscle depletion, and no edema. Muscle loss noted in temple region and legs. Patient reports her legs are very weak and she is having difficulty walking.  Patient meets criteria for severe acute malnutrition due to 21% weight loss in 1 month, intake </=50% of estimated energy requirements for >/= 5 days. Likely also in the setting of social/environmental circumstances.  Discussed with RN.   Diet Order:  Diet regular Room service appropriate? Yes; Fluid consistency: Thin  Skin:  Reviewed, no issues  Last BM:  06/30/2016  Height:   Ht Readings from Last 1 Encounters:  06/29/16 5\' 4"  (1.626 m)    Weight:   Wt Readings from Last 1 Encounters:  06/29/16 127 lb 10.3 oz (57.9 kg)    Ideal Body Weight:  54.54 kg  BMI:  Body mass index is 21.91 kg/m.  Estimated Nutritional  Needs:   Kcal:  1425-1660 (MSJ x 1.2-1.4)  Protein:  72-87 grams (1.25-1.5 grams/kg)  Fluid:  1.4-1.6 L/day  EDUCATION NEEDS:   Education needs addressed (Adequate intake of protein and calories through small, frequent meals.)  Helane RimaLeanne Charliegh Vasudevan, MS, RD, LDN Pager: [phone] After Hours Pager: [phone]

## 2016-07-02 LAB — CBC
HEMATOCRIT: 22.3 % — AB (ref 36.0–46.0)
Hemoglobin: 7.6 g/dL — ABNORMAL LOW (ref 12.0–15.0)
MCH: 34.4 pg — ABNORMAL HIGH (ref 26.0–34.0)
MCHC: 34.1 g/dL (ref 30.0–36.0)
MCV: 100.9 fL — AB (ref 78.0–100.0)
PLATELETS: 155 10*3/uL (ref 150–400)
RBC: 2.21 MIL/uL — ABNORMAL LOW (ref 3.87–5.11)
RDW: 21.7 % — AB (ref 11.5–15.5)
WBC: 9.1 10*3/uL (ref 4.0–10.5)

## 2016-07-02 LAB — BASIC METABOLIC PANEL
ANION GAP: 6 (ref 5–15)
CALCIUM: 7.5 mg/dL — AB (ref 8.9–10.3)
CO2: 27 mmol/L (ref 22–32)
Chloride: 95 mmol/L — ABNORMAL LOW (ref 101–111)
GLUCOSE: 74 mg/dL (ref 65–99)
Potassium: 3.3 mmol/L — ABNORMAL LOW (ref 3.5–5.1)
Sodium: 128 mmol/L — ABNORMAL LOW (ref 135–145)

## 2016-07-02 LAB — PHOSPHORUS: PHOSPHORUS: 2.8 mg/dL (ref 2.5–4.6)

## 2016-07-02 MED ORDER — ZOLPIDEM TARTRATE 5 MG PO TABS
5.0000 mg | ORAL_TABLET | Freq: Once | ORAL | Status: AC
  Administered 2016-07-02: 5 mg via ORAL
  Filled 2016-07-02: qty 1

## 2016-07-02 MED ORDER — SODIUM CHLORIDE 0.9 % IV SOLN
1.0000 g | Freq: Once | INTRAVENOUS | Status: AC
  Administered 2016-07-02: 1 g via INTRAVENOUS
  Filled 2016-07-02: qty 10

## 2016-07-02 MED ORDER — POTASSIUM CHLORIDE CRYS ER 20 MEQ PO TBCR
40.0000 meq | EXTENDED_RELEASE_TABLET | Freq: Two times a day (BID) | ORAL | Status: AC
  Administered 2016-07-02 (×2): 40 meq via ORAL
  Filled 2016-07-02 (×2): qty 2

## 2016-07-02 NOTE — Progress Notes (Signed)
PROGRESS NOTE    Virginia Gongatricia G Strnad  ZOX:[id number] DOB: 07/03/1966 DOA: 06/29/2016 PCP: No PCP Per Patient    Brief Narrative: Virginia Hess is a 50 y.o. female with medical history significant of ETOH abuse who has been drinking heavily for 2 wks due to depression, presents with sob, cough and generalized weakness,. On arrival she was found to have several electrolyte disturbances.   Assessment & Plan:   Active Problems:   ETOH abuse   Hypokalemia   Hypomagnesemia   Lactic acid acidosis   Elevated LFTs   UTI (urinary tract infection)   Depressed   ETOH abuse: On CIWA ,  Watch for signs of withdrawal. Currently none today.  Mild elevated liver function tests. No nausea or vomiting.    Lactic acidosis: Probably from alcohol intoxication . Improved to 3.8. Repeat level is 1.9. Resolved.   Hypokalemia: Replete as needed. Repeat level is 3.3, more potassium supplements ordered.   Hypophosphatemia: Replete and repeat in am shows normal phos.   Hypocalcemia:  Replete as needed. Calcium gluconate ordered.   Hypomagnesemia:  Replete as needed. Repeat level is 1.7, 2 more runs of magnesium sulfate ordered.   Hyponatremia: dehydration and decreased po intake.  IV hydration and repeat in am shows improvement to 128.  ? SIADH. tsh ordered, will get urine and serum osmolality.   Anemia: macrocytic. ? Anemia of chronic disease.  Hemoglobin dropped to 7.6, from 11.3 Anemia panel  Sent, shows ferritin of 5230, normal folate and iron. Normal vit b12.  Repeat stool for occult blood ordered.    ? Refeeding syndrome: replete electrolytes as needed.  Dietary consult appreciated. Increased the dose of thiamine.     DVT prophylaxis: (Lovenox/) Code Status: (Full/) Family Communication: mother at bedside.  Disposition Plan: pending PT evaluation.    Consultants:   None.    Procedures: none.    Antimicrobials: none.    Subjective: No new complaints.  Can get  up and walk.  Objective: Vitals:   07/01/16 1456 07/02/16 0003 07/02/16 0419 07/02/16 1300  BP: 114/72 105/67 109/65 119/80  Pulse: (!) 120 (!) 135 (!) 118 (!) 112  Resp: 20 20 20  (!) 22  Temp: 98.2 F (36.8 C) 99.5 F (37.5 C) 98 F (36.7 C) 98.2 F (36.8 C)  TempSrc: Oral Oral Oral Oral  SpO2: 99% 98% 95% 98%  Weight:      Height:        Intake/Output Summary (Last 24 hours) at 07/02/16 1742 Last data filed at 07/01/16 2131  Gross per 24 hour  Intake              240 ml  Output                0 ml  Net              240 ml   Filed Weights   06/29/16 1722 06/29/16 1728  Weight: 57.9 kg (127 lb 11.2 oz) 57.9 kg (127 lb 10.3 oz)    Examination:  General exam: Appears comfortable.  Respiratory system: Clear to auscultation. Respiratory effort normal. Cardiovascular system: S1 & S2 heard, RRR. No JVD, murmurs, rubs, gallops or clicks. No pedal edema. Gastrointestinal system: Abdomen is nondistended, soft and nontender. No organomegaly or masses felt. Normal bowel sounds heard. Central nervous system: Alert and oriented. No focal neurological deficits. Extremities: Symmetric 5 x 5 power. Skin: No rashes, lesions or ulcers Psychiatry: no new complaints.  .Marland Kitchen  Data Reviewed: I have personally reviewed following labs and imaging studies  CBC:  Recent Labs Lab 06/29/16 0919 06/29/16 1238 06/30/16 0511 07/02/16 0651  WBC 8.6 7.2 5.7 9.1  NEUTROABS 6.0  --   --   --   HGB 11.3* 9.3* 8.1* 7.6*  HCT 31.6* 25.9* 22.7* 22.3*  MCV 96.6 95.9 97.4 100.9*  PLT 202 179 130* 155   Basic Metabolic Panel:  Recent Labs Lab 06/29/16 0925 06/29/16 1238 06/29/16 1839 06/30/16 0511 06/30/16 1905 07/01/16 0623 07/01/16 1843 07/02/16 0651  NA  --   --  129* 129* 127* 128* 127* 128*  K  --   --  3.0* 2.9* 3.6 3.9 3.3* 3.3*  CL  --   --  93* 98* 96* 98* 95* 95*  CO2  --   --  24 26 23 26 25 27   GLUCOSE  --   --  115* 92 131* 85 95 74  BUN  --   --  8 8 7  5* <5* <5*    CREATININE  --  <0.30* 0.37* 0.34* 0.42* 0.36* 0.33* <0.30*  CALCIUM  --   --  7.4* 7.3* 7.8* 7.5* 7.4* 7.5*  MG 1.6*  --  1.9 1.6*  --  1.7  --   --   PHOS  --  1.6*  --  <1.0* <1.0*  --  2.8 2.8   GFR: CrCl cannot be calculated (This lab value cannot be used to calculate CrCl because it is not a number: <0.30). Liver Function Tests:  Recent Labs Lab 06/29/16 0919 06/30/16 0511  AST 476* 547*  ALT 128* 106*  ALKPHOS 670* 542*  BILITOT 3.5* 4.1*  PROT 5.6* 4.3*  ALBUMIN 2.9* 2.4*    Recent Labs Lab 06/29/16 0919  LIPASE 65*   No results for input(s): AMMONIA in the last 168 hours. Coagulation Profile:  Recent Labs Lab 06/29/16 0919  INR 1.06   Cardiac Enzymes:  Recent Labs Lab 06/29/16 0919  CKTOTAL 76   BNP (last 3 results) No results for input(s): PROBNP in the last 8760 hours. HbA1C: No results for input(s): HGBA1C in the last 72 hours. CBG:  Recent Labs Lab 06/30/16 0640  GLUCAP 102*   Lipid Profile: No results for input(s): CHOL, HDL, LDLCALC, TRIG, CHOLHDL, LDLDIRECT in the last 72 hours. Thyroid Function Tests: No results for input(s): TSH, T4TOTAL, FREET4, T3FREE, THYROIDAB in the last 72 hours. Anemia Panel: No results for input(s): VITAMINB12, FOLATE, FERRITIN, TIBC, IRON, RETICCTPCT in the last 72 hours. Sepsis Labs:  Recent Labs Lab 06/29/16 1610 06/30/16 1039 07/01/16 1034  LATICACIDVEN 12.93* 3.8* 1.9    Recent Results (from the past 240 hour(s))  Urine culture     Status: Abnormal   Collection Time: 06/29/16 10:36 AM  Result Value Ref Range Status   Specimen Description URINE, RANDOM  Final   Special Requests NONE  Final   Culture MULTIPLE SPECIES PRESENT, SUGGEST RECOLLECTION (A)  Final   Report Status 06/30/2016 FINAL  Final  Blood culture (routine x 2)     Status: None (Preliminary result)   Collection Time: 06/29/16 11:01 AM  Result Value Ref Range Status   Specimen Description BLOOD LEFT HAND  Final   Special  Requests BOTTLES DRAWN AEROBIC AND ANAEROBIC 4.5ML  Final   Culture   Final    NO GROWTH 3 DAYS Performed at [facility]    Report Status PENDING  Incomplete  Blood culture (routine x 2)  Status: None (Preliminary result)   Collection Time: 06/29/16 11:08 AM  Result Value Ref Range Status   Specimen Description BLOOD LEFT ANTECUBITAL  Final   Special Requests IN PEDIATRIC BOTTLE  Final   Culture   Final    NO GROWTH 3 DAYS Performed at [facility]    Report Status PENDING  Incomplete         Radiology Studies: No results found.      Scheduled Meds: . benzonatate  100 mg Oral TID  . cefpodoxime  200 mg Oral Q12H  . dextromethorphan-guaiFENesin  1 tablet Oral BID  . enoxaparin (LOVENOX) injection  40 mg Subcutaneous Q24H  . feeding supplement (ENSURE ENLIVE)  237 mL Oral BID BM  . folic acid  1 mg Oral Daily  . loratadine  10 mg Oral Daily  . multivitamin with minerals  1 tablet Oral Daily  . potassium & sodium phosphates  1 packet Oral TID WC & HS  . potassium chloride  40 mEq Oral BID  . thiamine  250 mg Oral Daily   Continuous Infusions:    LOS: 3 days    Time spent: 30 minutes.     Kathlen Mody, MD [facility] Pager [phone]  If 7PM-7AM, please contact night-coverage [email or web address] Password [email or web address] 07/02/2016, 5:42 PM

## 2016-07-02 NOTE — Evaluation (Signed)
Physical Therapy Evaluation Patient Details Name: Virginia Hess MRN: [id number] DOB: Feb 06, 1966 Today's Date: 07/02/2016   History of Present Illness  Pt is a 50 y.o. female with medical history significant of ETOH abuse (recovery for 7 years, relapsed in May 2017 per chart) who has been drinking heavily for 2 wks now due to depression. Admitted for multiple electrolyte imbalances and lactic acidosis secondary to ETOH abuse.  Clinical Impression  Pt admitted with above. Pt with functional limitations due to the deficits listed below (see PT Problem List). Pt will benefit from skilled PT to increase their independence and safety with mobility to allow discharge. Pt reported significant decline in activity in past month; demonstrated significant weakness and activity tolerance during evaluation. Pt fatigued with minimal activity. Pt declined SNF recommendation, agreeable to HHPT with 24-hour care provided by pt's mother and use of RW with ambulation. Planning to educate on use of RW at next visit.    Follow Up Recommendations Home health PT;Supervision/Assistance - 24 hour    Equipment Recommendations  Rolling walker with 5" wheels    Recommendations for Other Services       Precautions / Restrictions Precautions Precautions: Fall Restrictions Weight Bearing Restrictions: No      Mobility  Bed Mobility Overal bed mobility: Needs Assistance Bed Mobility: Supine to Sit           General bed mobility comments: unsure of assistance level; pt's mother reported assisting pt to sit on EOB before student PT entered room; pt reported significant energy required to perform bed mobility with assistance; BP after bed mobility was 116/75, HR 125  Transfers Overall transfer level: Needs assistance Equipment used: 1 person hand held assist Transfers: Sit to/from Stand Sit to Stand: Mod assist         General transfer comment: assistance for rise and controlled descent; verbal cues  for UE positioning to push up from bed and to use arm rests on chair to assist with controlled lower  Ambulation/Gait Ambulation/Gait assistance: Mod assist Ambulation Distance (Feet): 8 Feet Assistive device: 1 person hand held assist Gait Pattern/deviations: Step-through pattern;Decreased stride length Gait velocity: decreased   General Gait Details: assistance for steadying and support; pt reported SOB post gait, however SpO2 on room air remained 99% throughout visit; BP post-gait (sitting in chair) 105/79, HR 129; pt's HR increased during gait, highest reading 152 bpm  Stairs            Wheelchair Mobility    Modified Rankin (Stroke Patients Only)       Balance Overall balance assessment: Needs assistance         Standing balance support: During functional activity;Single extremity supported Standing balance-Leahy Scale: Poor Standing balance comment: pt required hand held assist to maintain balance during gait and transfers; pt reported weakness contributing to balance deficit                             Pertinent Vitals/Pain Pain Assessment: No/denies pain (only in chest when coughing)    Home Living Family/patient expects to be discharged to:: Private residence Living Arrangements: Alone Available Help at Discharge: Family;Available 24 hours/day (pt's mother) Type of Home: House Home Access: Stairs to enter Entrance Stairs-Rails: Can reach both Entrance Stairs-Number of Steps: 2 Home Layout: One level Home Equipment: None Additional Comments: pt reports independence prior to approx one month ago; worsening weakness since with weight loss per pt    Prior Function  Level of Independence: Independent         Comments: pt states that she currently walks a few feet and then sits to rest; does this until she reaches destination; no reported falls     Hand Dominance        Extremity/Trunk Assessment               Lower Extremity  Assessment: Generalized weakness         Communication   Communication: No difficulties  Cognition Arousal/Alertness: Awake/alert Behavior During Therapy: Flat affect Overall Cognitive Status: Within Functional Limits for tasks assessed                      General Comments      Exercises     Assessment/Plan    PT Assessment Patient needs continued PT services  PT Problem List Decreased strength;Decreased activity tolerance;Decreased balance;Decreased mobility;Decreased knowledge of use of DME          PT Treatment Interventions DME instruction;Gait training;Stair training;Functional mobility training;Therapeutic activities;Therapeutic exercise;Balance training;Patient/family education    PT Goals (Current goals can be found in the Care Plan section)  Acute Rehab PT Goals Patient Stated Goal: resume independent lifestyle PT Goal Formulation: With patient Time For Goal Achievement: 07/09/16 Potential to Achieve Goals: Good    Frequency Min 3X/week   Barriers to discharge        Co-evaluation               End of Session   Activity Tolerance: Patient limited by fatigue Patient left: in chair;with call bell/phone within reach;with family/visitor present           Time: [id number] PT Time Calculation (min) (ACUTE ONLY): 28 min   Charges:   PT Evaluation $PT Eval Moderate Complexity: 1 Procedure     PT G CodesKaralee Height:        Kalisa Girtman 07/02/2016, 12:40 PM Karalee Heightachel Eyan Hagood, SPT

## 2016-07-03 ENCOUNTER — Inpatient Hospital Stay (HOSPITAL_COMMUNITY): Payer: 59

## 2016-07-03 LAB — CBC WITH DIFFERENTIAL/PLATELET
BASOS ABS: 0 10*3/uL (ref 0.0–0.1)
BASOS PCT: 0 %
EOS ABS: 0.1 10*3/uL (ref 0.0–0.7)
EOS PCT: 1 %
HCT: 24.5 % — ABNORMAL LOW (ref 36.0–46.0)
Hemoglobin: 8.2 g/dL — ABNORMAL LOW (ref 12.0–15.0)
LYMPHS ABS: 1.8 10*3/uL (ref 0.7–4.0)
Lymphocytes Relative: 16 %
MCH: 35 pg — ABNORMAL HIGH (ref 26.0–34.0)
MCHC: 33.5 g/dL (ref 30.0–36.0)
MCV: 104.7 fL — ABNORMAL HIGH (ref 78.0–100.0)
Monocytes Absolute: 1 10*3/uL (ref 0.1–1.0)
Monocytes Relative: 9 %
Neutro Abs: 8.4 10*3/uL — ABNORMAL HIGH (ref 1.7–7.7)
Neutrophils Relative %: 74 %
PLATELETS: 193 10*3/uL (ref 150–400)
RBC: 2.34 MIL/uL — AB (ref 3.87–5.11)
RDW: 22.2 % — AB (ref 11.5–15.5)
WBC: 11.3 10*3/uL — AB (ref 4.0–10.5)

## 2016-07-03 LAB — BASIC METABOLIC PANEL
Anion gap: 7 (ref 5–15)
BUN: 6 mg/dL (ref 6–20)
CALCIUM: 7.8 mg/dL — AB (ref 8.9–10.3)
CO2: 25 mmol/L (ref 22–32)
CREATININE: 0.42 mg/dL — AB (ref 0.44–1.00)
Chloride: 100 mmol/L — ABNORMAL LOW (ref 101–111)
Glucose, Bld: 123 mg/dL — ABNORMAL HIGH (ref 65–99)
Potassium: 3.1 mmol/L — ABNORMAL LOW (ref 3.5–5.1)
SODIUM: 132 mmol/L — AB (ref 135–145)

## 2016-07-03 MED ORDER — ZOLPIDEM TARTRATE 5 MG PO TABS
5.0000 mg | ORAL_TABLET | Freq: Once | ORAL | Status: AC
Start: 2016-07-03 — End: 2016-07-03
  Administered 2016-07-03: 5 mg via ORAL
  Filled 2016-07-03: qty 1

## 2016-07-03 MED ORDER — POTASSIUM CHLORIDE CRYS ER 20 MEQ PO TBCR
40.0000 meq | EXTENDED_RELEASE_TABLET | Freq: Two times a day (BID) | ORAL | Status: AC
  Administered 2016-07-03 – 2016-07-04 (×2): 40 meq via ORAL
  Filled 2016-07-03 (×2): qty 2

## 2016-07-03 NOTE — Progress Notes (Signed)
PROGRESS NOTE    Virginia Hess  ZOX:[id number] DOB: 08/16/1966 DOA: 06/29/2016 PCP: No PCP Per Patient    Brief Narrative: Virginia Hess is a 50 y.o. female with medical history significant of ETOH abuse who has been drinking heavily for 2 wks due to depression, presents with sob, cough and generalized weakness,. On arrival she was found to have several electrolyte disturbances.   Assessment & Plan:   Active Problems:   ETOH abuse   Hypokalemia   Hypomagnesemia   Lactic acid acidosis   Elevated LFTs   UTI (urinary tract infection)   Depressed   ETOH abuse: On CIWA ,  Watch for signs of withdrawal. Currently none today.  Mild elevated liver function tests. No nausea or vomiting.  US abdomen showed chronic cholecystitis.    Lactic acidosis: Probably from alcohol intoxication . Improved to 3.8. Repeat level is 1.9. Resolved.   Hypokalemia: Replete as needed. Repeat level is 3.1, more potassium supplements ordered.   Hypophosphatemia: Replete and repeat in am shows normal phos.   Hypocalcemia:  Replete as needed. Calcium gluconate ordered.  Improved.   Hypomagnesemia:  Replete as needed. Repeat level ordered for tomorrow.    Hyponatremia: dehydration and decreased po intake.  IV hydration and repeat in am shows improvement to 128.  ? SIADH. tsh ordered, will get urine and serum osmolality.   Anemia: macrocytic. ? Anemia of chronic disease.  Hemoglobin dropped to 7.6, from 11.3 Anemia panel  Sent, shows ferritin of 5230, normal folate and iron. Normal vit b12.  Repeat stool for occult blood ordered.  Continue to monitor.    ? Refeeding syndrome: replete electrolytes as needed.  Dietary consult appreciated. Increased the dose of thiamine.  Electrolytes improving.  PT eval recommending home health PT.   Febrile overnight: Blood cultures have been negative so far.  Repeat UA and CXR ordered.  She continues to reports some dysuria.     DVT  prophylaxis: (Lovenox/) Code Status: (Full/) Family Communication: mother at bedside.  Disposition Plan: home if afebrile .     Consultants:   None.    Procedures: none.    Antimicrobials: vantin   Subjective: Wants to know when she can go home.  Objective: Vitals:   07/02/16 2011 07/02/16 2217 07/03/16 0420 07/03/16 1500  BP: 100/68  108/72 99/64  Pulse: (!) 122  (!) 103 (!) 101  Resp: 20  20 18   Temp: (!) 101.5 F (38.6 C) 99.8 F (37.7 C) 98.9 F (37.2 C) 99.2 F (37.3 C)  TempSrc: Oral Oral Oral Oral  SpO2: 99%  100% 100%  Weight:      Height:        Intake/Output Summary (Last 24 hours) at 07/03/16 1756 Last data filed at 07/03/16 1017  Gross per 24 hour  Intake              350 ml  Output                0 ml  Net              350 ml   Filed Weights   06/29/16 1722 06/29/16 1728  Weight: 57.9 kg (127 lb 11.2 oz) 57.9 kg (127 lb 10.3 oz)    Examination:  General exam: Appears comfortable.  Respiratory system: Clear to auscultation. Respiratory effort normal. Cardiovascular system: S1 & S2 heard, RRR. No JVD, murmurs, rubs, gallops or clicks. No pedal edema. Gastrointestinal system: Abdomen is nondistended, soft and nontender.  No organomegaly or masses felt. Normal bowel sounds heard. Central nervous system: Alert and oriented. No focal neurological deficits. Extremities: Symmetric 5 x 5 power. Skin: No rashes, lesions or ulcers Psychiatry: no new complaints.  .     Data Reviewed: I have personally reviewed following labs and imaging studies  CBC:  Recent Labs Lab 06/29/16 0919 06/29/16 1238 06/30/16 0511 07/02/16 0651 07/03/16 1211  WBC 8.6 7.2 5.7 9.1 11.3*  NEUTROABS 6.0  --   --   --  8.4*  HGB 11.3* 9.3* 8.1* 7.6* 8.2*  HCT 31.6* 25.9* 22.7* 22.3* 24.5*  MCV 96.6 95.9 97.4 100.9* 104.7*  PLT 202 179 130* 155 193   Basic Metabolic Panel:  Recent Labs Lab 06/29/16 0925 06/29/16 1238 06/29/16 1839 06/30/16 0511 06/30/16 1905  07/01/16 0623 07/01/16 1843 07/02/16 0651 07/03/16 1211  NA  --   --  129* 129* 127* 128* 127* 128* 132*  K  --   --  3.0* 2.9* 3.6 3.9 3.3* 3.3* 3.1*  CL  --   --  93* 98* 96* 98* 95* 95* 100*  CO2  --   --  24 26 23 26 25 27 25   GLUCOSE  --   --  115* 92 131* 85 95 74 123*  BUN  --   --  8 8 7  5* <5* <5* 6  CREATININE  --  <0.30* 0.37* 0.34* 0.42* 0.36* 0.33* <0.30* 0.42*  CALCIUM  --   --  7.4* 7.3* 7.8* 7.5* 7.4* 7.5* 7.8*  MG 1.6*  --  1.9 1.6*  --  1.7  --   --   --   PHOS  --  1.6*  --  <1.0* <1.0*  --  2.8 2.8  --    GFR: Estimated Creatinine Clearance: 72.6 mL/min (by C-G formula based on SCr of 0.42 mg/dL (L)). Liver Function Tests:  Recent Labs Lab 06/29/16 0919 06/30/16 0511  AST 476* 547*  ALT 128* 106*  ALKPHOS 670* 542*  BILITOT 3.5* 4.1*  PROT 5.6* 4.3*  ALBUMIN 2.9* 2.4*    Recent Labs Lab 06/29/16 0919  LIPASE 65*   No results for input(s): AMMONIA in the last 168 hours. Coagulation Profile:  Recent Labs Lab 06/29/16 0919  INR 1.06   Cardiac Enzymes:  Recent Labs Lab 06/29/16 0919  CKTOTAL 76   BNP (last 3 results) No results for input(s): PROBNP in the last 8760 hours. HbA1C: No results for input(s): HGBA1C in the last 72 hours. CBG:  Recent Labs Lab 06/30/16 0640  GLUCAP 102*   Lipid Profile: No results for input(s): CHOL, HDL, LDLCALC, TRIG, CHOLHDL, LDLDIRECT in the last 72 hours. Thyroid Function Tests: No results for input(s): TSH, T4TOTAL, FREET4, T3FREE, THYROIDAB in the last 72 hours. Anemia Panel: No results for input(s): VITAMINB12, FOLATE, FERRITIN, TIBC, IRON, RETICCTPCT in the last 72 hours. Sepsis Labs:  Recent Labs Lab 06/29/16 [id number] 06/30/16 1039 07/01/16 1034  LATICACIDVEN 12.93* 3.8* 1.9    Recent Results (from the past 240 hour(s))  Urine culture     Status: Abnormal   Collection Time: 06/29/16 10:36 AM  Result Value Ref Range Status   Specimen Description URINE, RANDOM  Final   Special Requests  NONE  Final   Culture MULTIPLE SPECIES PRESENT, SUGGEST RECOLLECTION (A)  Final   Report Status 06/30/2016 FINAL  Final  Blood culture (routine x 2)     Status: None (Preliminary result)   Collection Time: 06/29/16 11:01 AM  Result Value  Ref Range Status   Specimen Description BLOOD LEFT HAND  Final   Special Requests BOTTLES DRAWN AEROBIC AND ANAEROBIC 4.5ML  Final   Culture   Final    NO GROWTH 4 DAYS [facility]    Report Status PENDING  Incomplete  Blood culture (routine x 2)     Status: None (Preliminary result)   Collection Time: 06/29/16 11:08 AM  Result Value Ref Range Status   Specimen Description BLOOD LEFT ANTECUBITAL  Final   Special Requests IN PEDIATRIC BOTTLE 3ML  Final   Culture   Final    NO GROWTH 4 DAYS [facility]    Report Status PENDING  Incomplete         Radiology Studies: [place] Abdomen Complete  Result Date: 07/03/2016 CLINICAL DATA:  Elevated liver function test. EXAM: ABDOMEN ULTRASOUND COMPLETE COMPARISON:  Chest CT scan dated 06/02/2016. FINDINGS: Gallbladder: There is a solitary 1.8 cm gallstone. There is diffuse thickening of the gallbladder wall. Negative sonographic Murphy's sign. Common bile duct: Diameter: 3 mm, normal. Liver: Diffuse increased echogenicity of the liver parenchyma consistent with hepatic steatosis as apparent on the prior CT scan of the chest dated 06/02/2016. IVC: Normal. Pancreas: Visualized portion unremarkable. Spleen: Size and appearance within normal limits. Right Kidney: Length: 10.0 cm. Echogenicity within normal limits. No mass or hydronephrosis visualized. Overall size of the right kidney is considerably smaller than the left even though the length is only slightly smaller. Left Kidney: Length: 10.6 cm. Echogenicity within normal limits. No mass or hydronephrosis visualized. Abdominal aorta: No aneurysm visualized. IMPRESSION: 1. Extensive hepatic steatosis. 2. Cholelithiasis with  thickening of the gallbladder wall without a positive sonographic Murphy sign. This prior presents chronic cholecystitis. 3. The right kidney is appreciably smaller than the left kidney. The possibility of vascular insufficiency should be considered. Is the patient hypertensive? Electronically Signed   By: Francene BoyersJames  Maxwell M.D.   On: 07/03/2016 09:42        Scheduled Meds: . benzonatate  100 mg Oral TID  . cefpodoxime  200 mg Oral Q12H  . dextromethorphan-guaiFENesin  1 tablet Oral BID  . enoxaparin (LOVENOX) injection  40 mg Subcutaneous Q24H  . feeding supplement (ENSURE ENLIVE)  237 mL Oral BID BM  . folic acid  1 mg Oral Daily  . loratadine  10 mg Oral Daily  . multivitamin with minerals  1 tablet Oral Daily  . potassium & sodium phosphates  1 packet Oral TID WC & HS  . thiamine  250 mg Oral Daily   Continuous Infusions:    LOS: 4 days    Time spent: 30 minutes.     Kathlen ModyAKULA,Edell Mesenbrink, MD [facility] Pager [phone]  If 7PM-7AM, please contact night-coverage [email or web address] Password [email or web address] 07/03/2016, 5:56 PM

## 2016-07-03 NOTE — Progress Notes (Signed)
Temperature was elevated tonight at 101.5, tylenol was given and On call MD was made aware. No new orders given and when temp was rechecked it was 99.8.

## 2016-07-04 DIAGNOSIS — E872 Acidosis: Secondary | ICD-10-CM

## 2016-07-04 LAB — COMPREHENSIVE METABOLIC PANEL
ALK PHOS: 390 U/L — AB (ref 38–126)
ALT: 77 U/L — AB (ref 14–54)
ANION GAP: 5 (ref 5–15)
AST: 223 U/L — ABNORMAL HIGH (ref 15–41)
Albumin: 1.9 g/dL — ABNORMAL LOW (ref 3.5–5.0)
BILIRUBIN TOTAL: 3.4 mg/dL — AB (ref 0.3–1.2)
BUN: <5 mg/dL — ABNORMAL LOW (ref 6–20)
CALCIUM: 7.7 mg/dL — AB (ref 8.9–10.3)
CO2: 26 mmol/L (ref 22–32)
CREATININE: 0.48 mg/dL (ref 0.44–1.00)
Chloride: 99 mmol/L — ABNORMAL LOW (ref 101–111)
GFR calc non Af Amer: >60 mL/min (ref >60)
Glucose, Bld: 83 mg/dL (ref 65–99)
Potassium: 3.8 mmol/L (ref 3.5–5.1)
SODIUM: 130 mmol/L — AB (ref 135–145)
TOTAL PROTEIN: 4.2 g/dL — AB (ref 6.5–8.1)

## 2016-07-04 LAB — CULTURE, BLOOD (ROUTINE X 2)
CULTURE: NO GROWTH
CULTURE: NO GROWTH

## 2016-07-04 LAB — CBC
HEMATOCRIT: 21.7 % — AB (ref 36.0–46.0)
HEMOGLOBIN: 7.3 g/dL — AB (ref 12.0–15.0)
MCH: 34.9 pg — AB (ref 26.0–34.0)
MCHC: 33.6 g/dL (ref 30.0–36.0)
MCV: 103.8 fL — AB (ref 78.0–100.0)
Platelets: 216 10*3/uL (ref 150–400)
RBC: 2.09 MIL/uL — AB (ref 3.87–5.11)
RDW: 21.2 % — ABNORMAL HIGH (ref 11.5–15.5)
WBC: 11.8 10*3/uL — ABNORMAL HIGH (ref 4.0–10.5)

## 2016-07-04 LAB — PHOSPHORUS: Phosphorus: 2.3 mg/dL — ABNORMAL LOW (ref 2.5–4.6)

## 2016-07-04 LAB — C DIFFICILE QUICK SCREEN W PCR REFLEX
C DIFFICLE (CDIFF) ANTIGEN: NEGATIVE
C Diff interpretation: NOT DETECTED
C Diff toxin: NEGATIVE

## 2016-07-04 LAB — PREPARE RBC (CROSSMATCH)

## 2016-07-04 LAB — OCCULT BLOOD X 1 CARD TO LAB, STOOL: FECAL OCCULT BLD: NEGATIVE

## 2016-07-04 LAB — ABO/RH: ABO/RH(D): B POS

## 2016-07-04 LAB — MAGNESIUM: Magnesium: 1.4 mg/dL — ABNORMAL LOW (ref 1.7–2.4)

## 2016-07-04 MED ORDER — DEXTROSE 5 % IV SOLN
3.0000 g | Freq: Once | INTRAVENOUS | Status: AC
  Administered 2016-07-04: 3 g via INTRAVENOUS
  Filled 2016-07-04: qty 6

## 2016-07-04 MED ORDER — SODIUM CHLORIDE 0.9 % IV SOLN
Freq: Once | INTRAVENOUS | Status: AC
  Administered 2016-07-04: 17:00:00 via INTRAVENOUS

## 2016-07-04 MED ORDER — LORAZEPAM 2 MG/ML IJ SOLN: 1.0000 mg | Freq: Four times a day (QID) | INTRAMUSCULAR | Status: DC | PRN

## 2016-07-04 MED ORDER — SODIUM PHOSPHATES 45 MMOLE/15ML IV SOLN
30.0000 mmol | Freq: Once | INTRAVENOUS | Status: AC
  Administered 2016-07-05: 30 mmol via INTRAVENOUS
  Filled 2016-07-04: qty 10

## 2016-07-04 MED ORDER — LORAZEPAM 1 MG PO TABS
1.0000 mg | ORAL_TABLET | Freq: Four times a day (QID) | ORAL | Status: DC | PRN
  Administered 2016-07-05: 1 mg via ORAL
  Filled 2016-07-04: qty 1

## 2016-07-04 MED ORDER — LORAZEPAM 2 MG/ML IJ SOLN
0.0000 mg | Freq: Four times a day (QID) | INTRAMUSCULAR | Status: DC
  Administered 2016-07-04: 2 mg via INTRAVENOUS
  Filled 2016-07-04: qty 1

## 2016-07-04 MED ORDER — LORAZEPAM 2 MG/ML IJ SOLN: 0.0000 mg | Freq: Two times a day (BID) | INTRAMUSCULAR | Status: DC

## 2016-07-04 NOTE — Progress Notes (Signed)
PROGRESS NOTE    Virginia Hess  ZOX:[id number] DOB: 06-12-1966 DOA: 06/29/2016 PCP: No PCP Per Patient    Brief Narrative: Virginia Hess is a 50 y.o. female with medical history significant of ETOH abuse who has been drinking heavily for 2 wks due to depression, presents with sob, cough and generalized weakness,. On arrival she was found to have several electrolyte disturbances.   Assessment & Plan:   Active Problems:   ETOH abuse   Hypokalemia   Hypomagnesemia   Lactic acid acidosis   Elevated LFTs   UTI (urinary tract infection)   Depressed   ETOH abuse: On CIWA ,  Watch for signs of withdrawal. Currently none today. Her HR is improving.  Mild elevated liver function tests. No nausea or vomiting or abdominal pain.  US abdomen showed chronic cholecystitis.    Lactic acidosis: Probably from alcohol intoxication . Improved to 3.8. Repeat level is 1.9. Resolved.   Hypokalemia: Replete as needed.repeat level is 3.8  Hypophosphatemia: Replete and repeat in am   Hypocalcemia:  Replete as needed. Calcium gluconate ordered.  Improved.   Hypomagnesemia:  Replete as needed. Repeat level ordered for tomorrow.    Hyponatremia: dehydration and decreased po intake.  IV hydration and repeat in am shows improvement to 128.  ? SIADH. tsh ordered, will get urine and serum osmolality.   Anemia: macrocytic. ? Anemia of chronic disease.  Hemoglobin dropped to 7.3, from 11.3 Anemia panel  Sent, shows ferritin of 5230, normal folate and iron. Normal vit b12.  Repeat stool for occult blood ordered.  Ordered 1 unit of prbc transfusion today.    ? Refeeding syndrome: replete electrolytes as needed.  Dietary consult appreciated. Increased the dose of thiamine.  Electrolytes improving.  PT eval recommending home health PT.   Febrile the night of 10/27 Blood cultures have been negative so far.  Repeat UA and CXR ordered. UA still has leukocytes, and bacteria, urine  cultures sent, CXR does not show any pneumonia, . Pt reports some diarrhea, will send stool for  c diff and GI pathogen pcr.  She continues to reports some dysuria.     DVT prophylaxis: (SCD'S.) Code Status: (Full/) Family Communication: mother at bedside.  Disposition Plan: home if afebrile by tomorrow .     Consultants:   None.    Procedures: none.    Antimicrobials: vantin   Subjective: Wants to know when she can go home.  Objective: Vitals:   07/03/16 1500 07/03/16 2036 07/04/16 0552 07/04/16 1633  BP: 99/64 108/62 95/69 (!) 101/59  Pulse: (!) 101 (!) 117 97 97  Resp: 18 20 18 20   Temp: 99.2 F (37.3 C) 99 F (37.2 C) 99.5 F (37.5 C) 97.6 F (36.4 C)  TempSrc: Oral Oral Oral Oral  SpO2: 100% 97% 95% 96%  Weight:      Height:        Intake/Output Summary (Last 24 hours) at 07/04/16 1710 Last data filed at 07/04/16 0800  Gross per 24 hour  Intake              960 ml  Output                0 ml  Net              960 ml   Filed Weights   06/29/16 1722 06/29/16 1728  Weight: 57.9 kg (127 lb 11.2 oz) 57.9 kg (127 lb 10.3 oz)    Examination:  General  exam: Appears comfortable.  Respiratory system: Clear to auscultation. Respiratory effort normal. Cardiovascular system: S1 & S2 heard, RRR. No JVD, murmurs, rubs, gallops or clicks. No pedal edema. Gastrointestinal system: Abdomen is nondistended, soft and nontender. No organomegaly or masses felt. Normal bowel sounds heard. Central nervous system: Alert and oriented. No focal neurological deficits. Extremities: Symmetric 5 x 5 power. Skin: No rashes, lesions or ulcers Psychiatry: no new complaints.  .     Data Reviewed: I have personally reviewed following labs and imaging studies  CBC:  Recent Labs Lab 06/29/16 0919 06/29/16 1238 06/30/16 0511 07/02/16 0651 07/03/16 1211 07/04/16 0542  WBC 8.6 7.2 5.7 9.1 11.3* 11.8*  NEUTROABS 6.0  --   --   --  8.4*  --   HGB 11.3* 9.3* 8.1* 7.6* 8.2*  7.3*  HCT 31.6* 25.9* 22.7* 22.3* 24.5* 21.7*  MCV 96.6 95.9 97.4 100.9* 104.7* 103.8*  PLT 202 179 130* 155 193 216   Basic Metabolic Panel:  Recent Labs Lab 06/29/16 0925  06/29/16 1839 06/30/16 0511 06/30/16 1905 07/01/16 0623 07/01/16 1843 07/02/16 0651 07/03/16 1211 07/04/16 0542  NA  --   --  129* 129* 127* 128* 127* 128* 132* 130*  K  --   --  3.0* 2.9* 3.6 3.9 3.3* 3.3* 3.1* 3.8  CL  --   --  93* 98* 96* 98* 95* 95* 100* 99*  CO2  --   --  24 26 23 26 25 27 25 26   GLUCOSE  --   --  115* 92 131* 85 95 74 123* 83  BUN  --   --  8 8 7  5* <5* <5* 6 <5*  CREATININE  --   < > 0.37* 0.34* 0.42* 0.36* 0.33* <0.30* 0.42* 0.48  CALCIUM  --   --  7.4* 7.3* 7.8* 7.5* 7.4* 7.5* 7.8* 7.7*  MG 1.6*  --  1.9 1.6*  --  1.7  --   --   --  1.4*  PHOS  --   < >  --  <1.0* <1.0*  --  2.8 2.8  --  2.3*  < > = values in this interval not displayed. GFR: Estimated Creatinine Clearance: 72.6 mL/min (by C-G formula based on SCr of 0.48 mg/dL). Liver Function Tests:  Recent Labs Lab 06/29/16 0919 06/30/16 0511 07/04/16 0542  AST 476* 547* 223*  ALT 128* 106* 77*  ALKPHOS 670* 542* 390*  BILITOT 3.5* 4.1* 3.4*  PROT 5.6* 4.3* 4.2*  ALBUMIN 2.9* 2.4* 1.9*    Recent Labs Lab 06/29/16 0919  LIPASE 65*   No results for input(s): AMMONIA in the last 168 hours. Coagulation Profile:  Recent Labs Lab 06/29/16 0919  INR 1.06   Cardiac Enzymes:  Recent Labs Lab 06/29/16 0919  CKTOTAL 76   BNP (last 3 results) No results for input(s): PROBNP in the last 8760 hours. HbA1C: No results for input(s): HGBA1C in the last 72 hours. CBG:  Recent Labs Lab 06/30/16 0640  GLUCAP 102*   Lipid Profile: No results for input(s): CHOL, HDL, LDLCALC, TRIG, CHOLHDL, LDLDIRECT in the last 72 hours. Thyroid Function Tests: No results for input(s): TSH, T4TOTAL, FREET4, T3FREE, THYROIDAB in the last 72 hours. Anemia Panel: No results for input(s): VITAMINB12, FOLATE, FERRITIN, TIBC,  IRON, RETICCTPCT in the last 72 hours. Sepsis Labs:  Recent Labs Lab 06/29/16 0929 06/30/16 1039 07/01/16 1034  LATICACIDVEN 12.93* 3.8* 1.9    Recent Results (from the past 240 hour(s))  Urine culture  Status: Abnormal   Collection Time: 06/29/16 10:36 AM  Result Value Ref Range Status   Specimen Description URINE, RANDOM  Final   Special Requests NONE  Final   Culture MULTIPLE SPECIES PRESENT, SUGGEST RECOLLECTION (A)  Final   Report Status 06/30/2016 FINAL  Final  Blood culture (routine x 2)     Status: None   Collection Time: 06/29/16 11:01 AM  Result Value Ref Range Status   Specimen Description BLOOD LEFT HAND  Final   Special Requests BOTTLES DRAWN AEROBIC AND ANAEROBIC 4.5ML  Final   Culture   Final    NO GROWTH 5 DAYS Performed at [facility]    Report Status 07/04/2016 FINAL  Final  Blood culture (routine x 2)     Status: None   Collection Time: 06/29/16 11:08 AM  Result Value Ref Range Status   Specimen Description BLOOD LEFT ANTECUBITAL  Final   Special Requests IN PEDIATRIC BOTTLE  Final   Culture   Final    NO GROWTH 5 DAYS Performed at [facility]    Report Status 07/04/2016 FINAL  Final         Radiology Studies: Dg Chest 2 View  Result Date: 07/03/2016 CLINICAL DATA:  Dyspnea and cough. Pt states cough has been ongoing for "a while". Smoker. EXAM: CHEST  2 VIEW COMPARISON:  Chest x-rays dated 06/29/2016 and 10/26/2007. FINDINGS: Heart size and mediastinal contours are normal. Lungs are clear. No pleural effusion or pneumothorax seen. Osseous structures about the chest are unremarkable. There is chronic elevation of the right hemidiaphragm. IMPRESSION: No active cardiopulmonary disease. No evidence of pneumonia or pulmonary edema. Electronically Signed   By: Bary Richard M.D.   On: 07/03/2016 18:37   US Abdomen Complete  Result Date: 07/03/2016 CLINICAL DATA:  Elevated liver function test. EXAM: ABDOMEN ULTRASOUND COMPLETE  COMPARISON:  Chest CT scan dated 06/02/2016. FINDINGS: Gallbladder: There is a solitary 1.8 cm gallstone. There is diffuse thickening of the gallbladder wall. Negative sonographic Murphy's sign. Common bile duct: Diameter: 3 mm, normal. Liver: Diffuse increased echogenicity of the liver parenchyma consistent with hepatic steatosis as apparent on the prior CT scan of the chest dated 06/02/2016. IVC: Normal. Pancreas: Visualized portion unremarkable. Spleen: Size and appearance within normal limits. Right Kidney: Length: 10.0 cm. Echogenicity within normal limits. No mass or hydronephrosis visualized. Overall size of the right kidney is considerably smaller than the left even though the length is only slightly smaller. Left Kidney: Length: 10.6 cm. Echogenicity within normal limits. No mass or hydronephrosis visualized. Abdominal aorta: No aneurysm visualized. IMPRESSION: 1. Extensive hepatic steatosis. 2. Cholelithiasis with thickening of the gallbladder wall without a positive sonographic Murphy sign. This prior presents chronic cholecystitis. 3. The right kidney is appreciably smaller than the left kidney. The possibility of vascular insufficiency should be considered. Is the patient hypertensive? Electronically Signed   By: Francene Boyers M.D.   On: 07/03/2016 09:42        Scheduled Meds: . benzonatate  100 mg Oral TID  . cefpodoxime  200 mg Oral Q12H  . dextromethorphan-guaiFENesin  1 tablet Oral BID  . enoxaparin (LOVENOX) injection  40 mg Subcutaneous Q24H  . feeding supplement (ENSURE ENLIVE)  237 mL Oral BID BM  . folic acid  1 mg Oral Daily  . loratadine  10 mg Oral Daily  . LORazepam  0-4 mg Intravenous Q6H   Followed by  . [START ON 07/06/2016] LORazepam  0-4 mg Intravenous Q12H  .  multivitamin with minerals  1 tablet Oral Daily  . potassium & sodium phosphates  1 packet Oral TID WC & HS  . sodium phosphate  Dextrose 5% IVPB  30 mmol Intravenous Once  . thiamine  250 mg Oral Daily    Continuous Infusions:    LOS: 5 days    Time spent: 30 minutes.     Kathlen ModyAKULA,Charee Tumblin, MD [facility] Pager [phone]  If 7PM-7AM, please contact night-coverage [email or web address] Password [email or web address] 07/04/2016, 5:10 PM

## 2016-07-05 LAB — CBC
HCT: 28.5 % — ABNORMAL LOW (ref 36.0–46.0)
Hemoglobin: 9.6 g/dL — ABNORMAL LOW (ref 12.0–15.0)
MCH: 33 pg (ref 26.0–34.0)
MCHC: 33.7 g/dL (ref 30.0–36.0)
MCV: 97.9 fL (ref 78.0–100.0)
PLATELETS: 250 10*3/uL (ref 150–400)
RBC: 2.91 MIL/uL — ABNORMAL LOW (ref 3.87–5.11)
RDW: 22.3 % — AB (ref 11.5–15.5)
WBC: 10.4 10*3/uL (ref 4.0–10.5)

## 2016-07-05 LAB — GASTROINTESTINAL PANEL BY PCR, STOOL (REPLACES STOOL CULTURE)

## 2016-07-05 LAB — COMPREHENSIVE METABOLIC PANEL
ALT: 73 U/L — ABNORMAL HIGH (ref 14–54)
AST: 213 U/L — AB (ref 15–41)
Albumin: 2 g/dL — ABNORMAL LOW (ref 3.5–5.0)
Alkaline Phosphatase: 418 U/L — ABNORMAL HIGH (ref 38–126)
Anion gap: 5 (ref 5–15)
BUN: 6 mg/dL (ref 6–20)
CHLORIDE: 103 mmol/L (ref 101–111)
CO2: 25 mmol/L (ref 22–32)
Calcium: 7.9 mg/dL — ABNORMAL LOW (ref 8.9–10.3)
Creatinine, Ser: 0.51 mg/dL (ref 0.44–1.00)
GFR calc Af Amer: >60 mL/min (ref >60)
Glucose, Bld: 99 mg/dL (ref 65–99)
POTASSIUM: 3.8 mmol/L (ref 3.5–5.1)
SODIUM: 133 mmol/L — AB (ref 135–145)
Total Bilirubin: 3.4 mg/dL — ABNORMAL HIGH (ref 0.3–1.2)
Total Protein: 4.7 g/dL — ABNORMAL LOW (ref 6.5–8.1)

## 2016-07-05 LAB — MAGNESIUM: MAGNESIUM: 2 mg/dL (ref 1.7–2.4)

## 2016-07-05 LAB — TYPE AND SCREEN
ABO/RH(D): B POS
Antibody Screen: NEGATIVE
Unit division: 0

## 2016-07-05 LAB — TSH: TSH: 3.189 u[IU]/mL (ref 0.350–4.500)

## 2016-07-05 LAB — PHOSPHORUS: PHOSPHORUS: 4 mg/dL (ref 2.5–4.6)

## 2016-07-05 MED ORDER — ADULT MULTIVITAMIN W/MINERALS CH: 1.0000 | ORAL_TABLET | Freq: Every day | ORAL | 0 refills | Status: DC

## 2016-07-05 MED ORDER — ENSURE ENLIVE PO LIQD: 237.0000 mL | Freq: Two times a day (BID) | ORAL | 12 refills | Status: DC

## 2016-07-05 MED ORDER — ZOLPIDEM TARTRATE 5 MG PO TABS
5.0000 mg | ORAL_TABLET | Freq: Every evening | ORAL | Status: DC | PRN
  Administered 2016-07-05: 5 mg via ORAL
  Filled 2016-07-05: qty 1

## 2016-07-05 MED ORDER — ALBUTEROL SULFATE HFA 108 (90 BASE) MCG/ACT IN AERS: 2.0000 | INHALATION_SPRAY | RESPIRATORY_TRACT | 0 refills | Status: AC | PRN

## 2016-07-05 MED ORDER — THIAMINE HCL 250 MG PO TABS: 250.0000 mg | ORAL_TABLET | Freq: Every day | ORAL | 0 refills | Status: DC

## 2016-07-05 MED ORDER — FOLIC ACID 1 MG PO TABS: 1.0000 mg | ORAL_TABLET | Freq: Every day | ORAL | 0 refills | Status: AC

## 2016-07-05 NOTE — Progress Notes (Signed)
Nutrition Follow-up  DOCUMENTATION CODES:   Severe malnutrition in context of acute illness/injury  INTERVENTION:   Continue monitoring magnesium, potassium, and phosphorus daily, MD to replete as needed, as pt is at risk for refeeding syndrome given severe acute malnutrition and low phosphorus, magnesium, and potassium. -Continue 250 mg Thiamine dose daily  -Continue Ensure Enlive po BID, each supplement provides 350 kcal and 20 grams of protein. -Continue snacks between meals -RD to continue to monitor for needs  NUTRITION DIAGNOSIS:   Malnutrition (Severe) related to acute illness, social / environmental circumstances as evidenced by energy intake < or equal to 50% for > or equal to 5 days, percent weight loss.  Ongoing.  GOAL:   Patient will meet greater than or equal to 90% of their needs  Progressing.  MONITOR:   PO intake, Supplement acceptance, Labs, Weight trends, I & O's  ASSESSMENT:   50 y.o.femalewith medical history significant of ETOH abuse who has been drinking heavily for 2 wks due to depression, presents with sob, cough and generalized weakness,. On arrival she was found to have several electrolyte disturbances.   Patient eating much better, 75% meal completion of most meals at this time. Pt continues to receive Ensure supplements and daily snacks. Electrolytes are WNL. Will monitor for further needs.  Medications: Folic acid tablet daily, Multivitamin with minerals daily, PHOS-NAK packet TID, Thiamine tablet daily Labs reviewed: Low Na Mg/Phos/K WNL  Diet Order:  Diet regular Room service appropriate? Yes; Fluid consistency: Thin  Skin:  Reviewed, no issues  Last BM:  10/30  Height:   Ht Readings from Last 1 Encounters:  06/29/16 5\' 4"  (1.626 m)    Weight:   Wt Readings from Last 1 Encounters:  06/29/16 127 lb 10.3 oz (57.9 kg)    Ideal Body Weight:  54.54 kg  BMI:  Body mass index is 21.91 kg/m.  Estimated Nutritional Needs:    Kcal:  1425-1660 (MSJ x 1.2-1.4)  Protein:  72-87 grams (1.25-1.5 grams/kg)  Fluid:  1.4-1.6 L/day  EDUCATION NEEDS:   Education needs addressed (Adequate intake of protein and calories through small, frequent meals.)  Tilda FrancoLindsey Aleza Pew, MS, RD, LDN Pager: [phone] After Hours Pager: [phone]

## 2016-07-05 NOTE — Progress Notes (Signed)
Physical Therapy Treatment Patient Details Name: Virginia Hess MRN: [id number] DOB: 11/06/1965 Today's Date: 07/05/2016    History of Present Illness Pt is a 50 y.o. female with medical history significant of ETOH abuse (recovery for 7 years, relapsed in May 2017 per chart) who has been drinking heavily for 2 wks now due to depression. Admitted for multiple electrolyte imbalances and lactic acidosis secondary to ETOH abuse.    PT Comments    Pt assisted with ambulating short distance.  Pt fatigues quickly and LE buckling observed with increased fatigue.  Follow Up Recommendations  Home health PT;Supervision/Assistance - 24 hour     Equipment Recommendations  Rolling walker with 5" wheels    Recommendations for Other Services       Precautions / Restrictions Precautions Precautions: Fall Restrictions Weight Bearing Restrictions: No    Mobility  Bed Mobility Overal bed mobility: Needs Assistance Bed Mobility: Supine to Sit;Sit to Supine     Supine to sit: Supervision Sit to supine: Supervision      Transfers Overall transfer level: Needs assistance Equipment used: Rolling walker (2 wheeled) Transfers: Sit to/from Stand Sit to Stand: Min guard         General transfer comment: verbal cues for hand placement  Ambulation/Gait Ambulation/Gait assistance: Min assist Ambulation Distance (Feet): 30 Feet Assistive device: Rolling walker (2 wheeled) Gait Pattern/deviations: Step-through pattern;Decreased stride length Gait velocity: decreased   General Gait Details: very slow pace, multiple standing rest breaks due to fatigue, LEs buckling with increase in distance and pt requested chair, HR 123 bpm during gait (RN aware)   Stairs            Wheelchair Mobility    Modified Rankin (Stroke Patients Only)       Balance                                    Cognition Arousal/Alertness: Awake/alert Behavior During Therapy: Flat  affect Overall Cognitive Status: Within Functional Limits for tasks assessed                      Exercises      General Comments        Pertinent Vitals/Pain Pain Assessment: No/denies pain    Home Living                      Prior Function            PT Goals (current goals can now be found in the care plan section) Progress towards PT goals: Progressing toward goals    Frequency    Min 3X/week      PT Plan Current plan remains appropriate    Co-evaluation             End of Session Equipment Utilized During Treatment: Gait belt Activity Tolerance: Patient limited by fatigue Patient left: with call bell/phone within reach;with family/visitor present;in bed     Time: [id number] PT Time Calculation (min) (ACUTE ONLY): 14 min  Charges:  $Gait Training: 8-22 mins                    G Codes:      Candus Braud,KATHrine E 07/05/2016, 11:28 AM Zenovia JarredKati Keslie Gritz, PT, DPT 07/05/2016 Pager: [phone]

## 2016-07-06 LAB — URINE CULTURE: Culture: 20000 — AB

## 2016-07-06 NOTE — Discharge Summary (Signed)
Physician Discharge Summary  Virginia Hess TKZ:[id number] DOB: April 23, 1966 DOA: 06/29/2016  PCP: No PCP Per Patient  Admit date: 06/29/2016 Discharge date: 07/06/2016  Admitted From: [facility].  Disposition:  [facility].   Recommendations for Outpatient Follow-up:  1. Follow up with PCP in 1-2 weeks 2. Please obtain BMP/CBC in one week 3. Please follow up on the following pending results:  Home Health:yes  Discharge Condition:stable.  CODE STATUS:FULL CODE.  Diet recommendation: Heart Healthy   Brief/Interim Summary: Virginia Hess a 50 y.o.femalewith medical history significant of ETOH abuse who has been drinking heavily for 2 wks due to depression, presents with sob, cough and generalized weakness,. On arrival she was found to have several electrolyte disturbances.   Discharge Diagnoses:  Active Problems:   ETOH abuse   Hypokalemia   Hypomagnesemia   Lactic acid acidosis   Elevated LFTs   UTI (urinary tract infection)   Depressed  ETOH abuse: On CIWA ,  Watch for signs of withdrawal. Currently none today. Her HR is improving.  Mild elevated liver function tests. No nausea or vomiting or abdominal pain.  US abdomen showed chronic cholecystitis. Recommend outpatient follow up with PCP .    Lactic acidosis: Probably from alcohol intoxication . Improved to 3.8. Repeat level is 1.9. Resolved.   Hypokalemia: Replete as needed.repeat level is 3.8  Hypophosphatemia: Replete and repeat in am   Hypocalcemia:  Replete as needed. Calcium gluconate ordered.  Improved.   Hypomagnesemia:  Replete as needed. Repeat level normal.   Hyponatremia: dehydration and decreased po intake.  IV hydration and repeat in am shows improvement to 128.  ? SIADH. tsh ordered, will get urine and serum osmolality.   Anemia: macrocytic. ? Anemia of chronic disease.  Hemoglobin dropped to 7.3, from 11.3 Anemia panel  Sent, shows ferritin of 5230, normal folate and iron. Normal vit  b12.  Repeat stool for occult blood ordered and is negative.  1 unit of prbc transfusion ordered and repeat H&H is stable.    ? Refeeding syndrome: replete electrolytes as needed.  Dietary consult appreciated. Increased the dose of thiamine.  Electrolytes improved . PT eval recommending home health PT.   Febrile the night of 10/27 Blood cultures have been negative so far.  Repeat UA and CXR ordered. UA still has leukocytes, and bacteria, urine cultures sent, CXR does not show any pneumonia, . Pt reports some diarrhea, will send stool for  c diff and GI pathogen pcr, which have been negative.     Discharge Instructions  Discharge Instructions    Diet general    Complete by:  As directed    Discharge instructions    Complete by:  As directed    Follow up with PCP in 1 to 2 days.       Medication List    STOP taking these medications   azithromycin 250 MG tablet Commonly known as:  ZITHROMAX   benzonatate 100 MG capsule Commonly known as:  TESSALON   levofloxacin 500 MG tablet Commonly known as:  LEVAQUIN   predniSONE 10 MG tablet Commonly known as:  DELTASONE     TAKE these medications   albuterol 108 (90 Base) MCG/ACT inhaler Commonly known as:  PROVENTIL HFA;VENTOLIN HFA Inhale 2 puffs into the lungs every 4 (four) hours as needed for wheezing or shortness of breath.   feeding supplement (ENSURE ENLIVE) Liqd Take 237 mLs by mouth 2 (two) times daily between meals.   folic acid 1 MG tablet Commonly known  as:  FOLVITE Take 1 tablet (1 mg total) by mouth daily.   multivitamin with minerals Tabs tablet Take 1 tablet by mouth daily.   thiamine 250 MG tablet Take 1 tablet (250 mg total) by mouth daily.      Follow-up Information    [facility] Follow up on 07/13/2016.   Why:  Appointment at 2:00 PM please keep. Please take with you ID, Insurance card and all medications that you are taking.   Contact information: [address] [place] [address]         No Known Allergies  Consultations:  none   Procedures/Studies: Dg Chest 2 View  Result Date: 07/03/2016 CLINICAL DATA:  Dyspnea and cough. Pt states cough has been ongoing for "a while". Smoker. EXAM: CHEST  2 VIEW COMPARISON:  Chest x-rays dated 06/29/2016 and 10/26/2007. FINDINGS: Heart size and mediastinal contours are normal. Lungs are clear. No pleural effusion or pneumothorax seen. Osseous structures about the chest are unremarkable. There is chronic elevation of the right hemidiaphragm. IMPRESSION: No active cardiopulmonary disease. No evidence of pneumonia or pulmonary edema. Electronically Signed   By: Bary Richard M.D.   On: 07/03/2016 18:37   Dg Chest 2 View  Result Date: 06/29/2016 CLINICAL DATA:  Shortness of breath.  Smoker. EXAM: CHEST  2 VIEW COMPARISON:  06/01/2016 FINDINGS: Lateral view degraded by patient arm position. Moderate right hemidiaphragm elevation, increased. Midline trachea. Normal heart size and mediastinal contours. No pleural effusion or pneumothorax. Clear lungs. IMPRESSION: No acute cardiopulmonary disease. Increase in moderate right hemidiaphragm elevation since 06/01/2016. Electronically Signed   By: Jeronimo Greaves M.D.   On: 06/29/2016 09:31   US Abdomen Complete  Result Date: 07/03/2016 CLINICAL DATA:  Elevated liver function test. EXAM: ABDOMEN ULTRASOUND COMPLETE COMPARISON:  Chest CT scan dated 06/02/2016. FINDINGS: Gallbladder: There is a solitary 1.8 cm gallstone. There is diffuse thickening of the gallbladder wall. Negative sonographic Murphy's sign. Common bile duct: Diameter: 3 mm, normal. Liver: Diffuse increased echogenicity of the liver parenchyma consistent with hepatic steatosis as apparent on the prior CT scan of the chest dated 06/02/2016. IVC: Normal. Pancreas: Visualized portion unremarkable. Spleen: Size and appearance within normal limits. Right Kidney: Length: 10.0 cm. Echogenicity  within normal limits. No mass or hydronephrosis visualized. Overall size of the right kidney is considerably smaller than the left even though the length is only slightly smaller. Left Kidney: Length: 10.6 cm. Echogenicity within normal limits. No mass or hydronephrosis visualized. Abdominal aorta: No aneurysm visualized. IMPRESSION: 1. Extensive hepatic steatosis. 2. Cholelithiasis with thickening of the gallbladder wall without a positive sonographic Murphy sign. This prior presents chronic cholecystitis. 3. The right kidney is appreciably smaller than the left kidney. The possibility of vascular insufficiency should be considered. Is the patient hypertensive? Electronically Signed   By: Francene Boyers M.D.   On: 07/03/2016 09:42       Subjective: No new complaints.   Discharge Exam: Vitals:   07/05/16 0100 07/05/16 0452  BP: 97/69 91/60  Pulse: 97 97  Resp: 16 18  Temp: 98.2 F (36.8 C) 98 F (36.7 C)   Vitals:   07/04/16 1710 07/04/16 2044 07/05/16 0100 07/05/16 0452  BP: (!) 95/59 103/64 97/69 91/60   Pulse: (!) 103 100 97 97  Resp: 20 18 16 18   Temp: 97.9 F (36.6 C) 97.9 F (36.6 C) 98.2 F (36.8 C) 98 F (36.7 C)  TempSrc: Oral Oral Oral Oral  SpO2: 97%  99% 98%  Weight:      Height:        General: Pt is alert, awake, not in acute distress Cardiovascular: RRR, S1/S2 +, no rubs, no gallops Respiratory: CTA bilaterally, no wheezing, no rhonchi Abdominal: Soft, NT, ND, bowel sounds + Extremities: no edema, no cyanosis    The results of significant diagnostics from this hospitalization (including imaging, microbiology, ancillary and laboratory) are listed below for reference.     Microbiology: Recent Results (from the past 240 hour(s))  Urine culture     Status: Abnormal   Collection Time: 06/29/16 10:36 AM  Result Value Ref Range Status   Specimen Description URINE, RANDOM  Final   Special Requests NONE  Final   Culture MULTIPLE SPECIES PRESENT, SUGGEST  RECOLLECTION (A)  Final   Report Status 06/30/2016 FINAL  Final  Blood culture (routine x 2)     Status: None   Collection Time: 06/29/16 11:01 AM  Result Value Ref Range Status   Specimen Description BLOOD LEFT HAND  Final   Special Requests BOTTLES DRAWN AEROBIC AND ANAEROBIC 4.5ML  Final   Culture   Final    NO GROWTH 5 DAYS Performed at [facility]    Report Status 07/04/2016 FINAL  Final  Blood culture (routine x 2)     Status: None   Collection Time: 06/29/16 11:08 AM  Result Value Ref Range Status   Specimen Description BLOOD LEFT ANTECUBITAL  Final   Special Requests IN PEDIATRIC BOTTLE  Final   Culture   Final    NO GROWTH 5 DAYS Performed at [facility]    Report Status 07/04/2016 FINAL  Final  Culture, Urine     Status: Abnormal   Collection Time: 07/03/16  3:10 PM  Result Value Ref Range Status   Specimen Description URINE, RANDOM  Final   Special Requests NONE  Final   Culture (A)  Final    20,000 COLONIES/mL STAPHYLOCOCCUS SPECIES (COAGULASE NEGATIVE)   Report Status 07/06/2016 FINAL  Final   Organism ID, Bacteria STAPHYLOCOCCUS SPECIES (COAGULASE NEGATIVE) (A)  Final      Susceptibility   Staphylococcus species (coagulase negative) - MIC*    CIPROFLOXACIN 4 RESISTANT Resistant     GENTAMICIN <=0.5 SENSITIVE Sensitive     NITROFURANTOIN <=16 SENSITIVE Sensitive     OXACILLIN >=4 RESISTANT Resistant     TETRACYCLINE >=16 RESISTANT Resistant     VANCOMYCIN 1 SENSITIVE Sensitive     TRIMETH/SULFA <=10 SENSITIVE Sensitive     CLINDAMYCIN 4 RESISTANT Resistant     RIFAMPIN <=0.5 SENSITIVE Sensitive     Inducible Clindamycin NEGATIVE Sensitive     * 20,000 COLONIES/mL STAPHYLOCOCCUS SPECIES (COAGULASE NEGATIVE)  Culture, blood (Routine X 2) w Reflex to ID Panel     Status: None (Preliminary result)   Collection Time: 07/03/16  7:16 PM  Result Value Ref Range Status   Specimen Description BLOOD LEFT ANTECUBITAL  Final   Special Requests  BOTTLES DRAWN AEROBIC AND ANAEROBIC 10CC  Final   Culture   Final    NO GROWTH 1 DAY Performed at [facility]    Report Status PENDING  Incomplete  Culture, blood (Routine X 2) w Reflex to ID Panel     Status: None (Preliminary result)   Collection Time: 07/03/16  7:16 PM  Result Value Ref Range Status   Specimen Description BLOOD RIGHT WRIST  Final   Special Requests IN PEDIATRIC BOTTLE 3CC  Final  Culture   Final    NO GROWTH 1 DAY Performed at [facility]    Report Status PENDING  Incomplete  C difficile quick scan w PCR reflex     Status: None   Collection Time: 07/04/16  6:05 PM  Result Value Ref Range Status   C Diff antigen NEGATIVE NEGATIVE Final   C Diff toxin NEGATIVE NEGATIVE Final   C Diff interpretation No C. difficile detected.  Final  Gastrointestinal Panel by PCR , Stool     Status: None   Collection Time: 07/04/16  6:05 PM  Result Value Ref Range Status   Campylobacter species NOT DETECTED NOT DETECTED Final   Plesimonas shigelloides NOT DETECTED NOT DETECTED Final   Salmonella species NOT DETECTED NOT DETECTED Final   Yersinia enterocolitica NOT DETECTED NOT DETECTED Final   Vibrio species NOT DETECTED NOT DETECTED Final   Vibrio cholerae NOT DETECTED NOT DETECTED Final   Enteroaggregative E coli (EAEC) NOT DETECTED NOT DETECTED Final   Enteropathogenic E coli (EPEC) NOT DETECTED NOT DETECTED Final   Enterotoxigenic E coli (ETEC) NOT DETECTED NOT DETECTED Final   Shiga like toxin producing E coli (STEC) NOT DETECTED NOT DETECTED Final   Shigella/Enteroinvasive E coli (EIEC) NOT DETECTED NOT DETECTED Final   Cryptosporidium NOT DETECTED NOT DETECTED Final   Cyclospora cayetanensis NOT DETECTED NOT DETECTED Final   Entamoeba histolytica NOT DETECTED NOT DETECTED Final   Giardia lamblia NOT DETECTED NOT DETECTED Final   Adenovirus F40/41 NOT DETECTED NOT DETECTED Final   Astrovirus NOT DETECTED NOT DETECTED Final   Norovirus GI/GII NOT  DETECTED NOT DETECTED Final   Rotavirus A NOT DETECTED NOT DETECTED Final   Sapovirus (I, II, IV, and V) NOT DETECTED NOT DETECTED Final     Labs: BNP (last 3 results) No results for input(s): BNP in the last 8760 hours. Basic Metabolic Panel:  Recent Labs Lab 06/29/16 1839 06/30/16 0511 06/30/16 1905 07/01/16 0623 07/01/16 1843 07/02/16 0651 07/03/16 1211 07/04/16 0542 07/05/16 0347  NA 129* 129* 127* 128* 127* 128* 132* 130* 133*  K 3.0* 2.9* 3.6 3.9 3.3* 3.3* 3.1* 3.8 3.8  CL 93* 98* 96* 98* 95* 95* 100* 99* 103  CO2 24 26 23 26 25 27 25 26 25   GLUCOSE 115* 92 131* 85 95 74 123* 83 99  BUN 8 8 7  5* <5* <5* 6 <5* 6  CREATININE 0.37* 0.34* 0.42* 0.36* 0.33* <0.30* 0.42* 0.48 0.51  CALCIUM 7.4* 7.3* 7.8* 7.5* 7.4* 7.5* 7.8* 7.7* 7.9*  MG 1.9 1.6*  --  1.7  --   --   --  1.4* 2.0  PHOS  --  <1.0* <1.0*  --  2.8 2.8  --  2.3* 4.0   Liver Function Tests:  Recent Labs Lab 06/30/16 0511 07/04/16 0542 07/05/16 0347  AST 547* 223* 213*  ALT 106* 77* 73*  ALKPHOS 542* 390* 418*  BILITOT 4.1* 3.4* 3.4*  PROT 4.3* 4.2* 4.7*  ALBUMIN 2.4* 1.9* 2.0*   No results for input(s): LIPASE, AMYLASE in the last 168 hours. No results for input(s): AMMONIA in the last 168 hours. CBC:  Recent Labs Lab 06/30/16 0511 07/02/16 0651 07/03/16 1211 07/04/16 0542 07/05/16 0347  WBC 5.7 9.1 11.3* 11.8* 10.4  NEUTROABS  --   --  8.4*  --   --   HGB 8.1* 7.6* 8.2* 7.3* 9.6*  HCT 22.7* 22.3* 24.5* 21.7* 28.5*  MCV 97.4 100.9* 104.7* 103.8* 97.9  PLT 130* 155  193 216 250   Cardiac Enzymes: No results for input(s): CKTOTAL, CKMB, CKMBINDEX, TROPONINI in the last 168 hours. BNP: Invalid input(s): POCBNP CBG:  Recent Labs Lab 06/30/16 0640  GLUCAP 102*   D-Dimer No results for input(s): DDIMER in the last 72 hours. Hgb A1c No results for input(s): HGBA1C in the last 72 hours. Lipid Profile No results for input(s): CHOL, HDL, LDLCALC, TRIG, CHOLHDL, LDLDIRECT in the last 72  hours. Thyroid function studies  Recent Labs  07/05/16 0347  TSH 3.189   Anemia work up No results for input(s): VITAMINB12, FOLATE, FERRITIN, TIBC, IRON, RETICCTPCT in the last 72 hours. Urinalysis    Component Value Date/Time   COLORURINE AMBER (A) 06/29/2016 1036   APPEARANCEUR CLOUDY (A) 06/29/2016 1036   LABSPEC 1.019 06/29/2016 1036   PHURINE 6.0 06/29/2016 1036   GLUCOSEU NEGATIVE 06/29/2016 1036   HGBUR SMALL (A) 06/29/2016 1036   BILIRUBINUR SMALL (A) 06/29/2016 1036   KETONESUR NEGATIVE 06/29/2016 1036   PROTEINUR NEGATIVE 06/29/2016 1036   UROBILINOGEN 1.0 10/26/2007 1330   NITRITE NEGATIVE 06/29/2016 1036   LEUKOCYTESUR LARGE (A) 06/29/2016 1036   Sepsis Labs Invalid input(s): PROCALCITONIN,  WBC,  LACTICIDVEN Microbiology Recent Results (from the past 240 hour(s))  Urine culture     Status: Abnormal   Collection Time: 06/29/16 10:36 AM  Result Value Ref Range Status   Specimen Description URINE, RANDOM  Final   Special Requests NONE  Final   Culture MULTIPLE SPECIES PRESENT, SUGGEST RECOLLECTION (A)  Final   Report Status 06/30/2016 FINAL  Final  Blood culture (routine x 2)     Status: None   Collection Time: 06/29/16 11:01 AM  Result Value Ref Range Status   Specimen Description BLOOD LEFT HAND  Final   Special Requests BOTTLES DRAWN AEROBIC AND ANAEROBIC 4.5ML  Final   Culture   Final    NO GROWTH 5 DAYS [facility]    Report Status 07/04/2016 FINAL  Final  Blood culture (routine x 2)     Status: None   Collection Time: 06/29/16 11:08 AM  Result Value Ref Range Status   Specimen Description BLOOD LEFT ANTECUBITAL  Final   Special Requests IN PEDIATRIC BOTTLE 3ML  Final   Culture   Final    NO GROWTH 5 DAYS [facility]     Report Status 07/04/2016 FINAL  Final  Culture, Urine     Status: Abnormal   Collection Time: 07/03/16  3:10 PM  Result Value Ref Range Status   Specimen Description URINE, RANDOM   Final   Special Requests NONE  Final   Culture (A)  Final    20,000 COLONIES/mL STAPHYLOCOCCUS SPECIES (COAGULASE NEGATIVE)   Report Status 07/06/2016 FINAL  Final   Organism ID, Bacteria STAPHYLOCOCCUS SPECIES (COAGULASE NEGATIVE) (A)  Final      Susceptibility   Staphylococcus species (coagulase negative) - MIC*    CIPROFLOXACIN 4 RESISTANT Resistant     GENTAMICIN <=0.5 SENSITIVE Sensitive     NITROFURANTOIN <=16 SENSITIVE Sensitive     OXACILLIN >=4 RESISTANT Resistant     TETRACYCLINE >=16 RESISTANT Resistant     VANCOMYCIN 1 SENSITIVE Sensitive     TRIMETH/SULFA <=10 SENSITIVE Sensitive     CLINDAMYCIN 4 RESISTANT Resistant     RIFAMPIN <=0.5 SENSITIVE Sensitive     Inducible Clindamycin NEGATIVE Sensitive     * 20,000 COLONIES/mL STAPHYLOCOCCUS SPECIES (COAGULASE NEGATIVE)  Culture, blood (Routine X 2) w Reflex to ID  Panel     Status: None (Preliminary result)   Collection Time: 07/03/16  7:16 PM  Result Value Ref Range Status   Specimen Description BLOOD LEFT ANTECUBITAL  Final   Special Requests BOTTLES DRAWN AEROBIC AND ANAEROBIC 10CC  Final   Culture   Final    NO GROWTH 1 DAY [facility]    Report Status PENDING  Incomplete  Culture, blood (Routine X 2) w Reflex to ID Panel     Status: None (Preliminary result)   Collection Time: 07/03/16  7:16 PM  Result Value Ref Range Status   Specimen Description BLOOD RIGHT WRIST  Final   Special Requests IN PEDIATRIC BOTTLE 3CC  Final   Culture   Final    NO GROWTH 1 DAY [facility]    Report Status PENDING  Incomplete  C difficile quick scan w PCR reflex     Status: None   Collection Time: 07/04/16  6:05 PM  Result Value Ref Range Status   C Diff antigen NEGATIVE NEGATIVE Final   C Diff toxin NEGATIVE NEGATIVE Final   C Diff interpretation No C. difficile detected.  Final  Gastrointestinal Panel by PCR , Stool     Status: None   Collection Time: 07/04/16  6:05 PM  Result Value  Ref Range Status   Campylobacter species NOT DETECTED NOT DETECTED Final   Plesimonas shigelloides NOT DETECTED NOT DETECTED Final   Salmonella species NOT DETECTED NOT DETECTED Final   Yersinia enterocolitica NOT DETECTED NOT DETECTED Final   Vibrio species NOT DETECTED NOT DETECTED Final   Vibrio cholerae NOT DETECTED NOT DETECTED Final   Enteroaggregative E coli (EAEC) NOT DETECTED NOT DETECTED Final   Enteropathogenic E coli (EPEC) NOT DETECTED NOT DETECTED Final   Enterotoxigenic E coli (ETEC) NOT DETECTED NOT DETECTED Final   Shiga like toxin producing E coli (STEC) NOT DETECTED NOT DETECTED Final   Shigella/Enteroinvasive E coli (EIEC) NOT DETECTED NOT DETECTED Final   Cryptosporidium NOT DETECTED NOT DETECTED Final   Cyclospora cayetanensis NOT DETECTED NOT DETECTED Final   Entamoeba histolytica NOT DETECTED NOT DETECTED Final   Giardia lamblia NOT DETECTED NOT DETECTED Final   Adenovirus F40/41 NOT DETECTED NOT DETECTED Final   Astrovirus NOT DETECTED NOT DETECTED Final   Norovirus GI/GII NOT DETECTED NOT DETECTED Final   Rotavirus A NOT DETECTED NOT DETECTED Final   Sapovirus (I, II, IV, and V) NOT DETECTED NOT DETECTED Final     Time coordinating discharge: Over 30 minutes  SIGNED:   Kathlen ModyAKULA,Kelcey Wickstrom, MD  [facility] 07/06/2016, 10:44 AM Pager   If 7PM-7AM, please contact night-coverage [email or web address] Password [email or web address]

## 2016-07-09 LAB — CULTURE, BLOOD (ROUTINE X 2)
CULTURE: NO GROWTH
Culture: NO GROWTH

## 2016-07-13 ENCOUNTER — Ambulatory Visit: Payer: 59 | Admitting: Family Medicine

## 2016-07-15 ENCOUNTER — Telehealth: Payer: Self-pay | Admitting: Internal Medicine

## 2016-07-15 NOTE — Telephone Encounter (Signed)
[facility] calling to inform Dr. Hyman HopesJegede that pt refused home health services due to not being able to pay copay  Was informed that [facility] but requested this refusal be communicated

## 2016-07-16 NOTE — Telephone Encounter (Signed)
FYI noted. Thank you!

## 2016-11-07 IMAGING — CT CT ANGIO CHEST
3 of 7 series · 18 of 36 positions shown · IV contrast (Omni 300)
Comparison: Chest radiograph dated 06/01/2016

CLINICAL DATA: 50-year-old female with shortness of breath and
chest pain and tachycardia.

EXAM:
CT ANGIOGRAPHY CHEST WITH CONTRAST
TECHNIQUE: Multidetector CT imaging of the chest was performed using the
standard protocol during bolus administration of intravenous
contrast. Multiplanar CT image reconstructions and MIPs were
obtained to evaluate the vascular anatomy.
CONTRAST:  100 cc Isovue 370

[Series 5: pe lung · axial · 0.62mm/px · z∈[+938,+1114]mm · 5 of 133 slices shown]
[im 23/133  mediastinal]
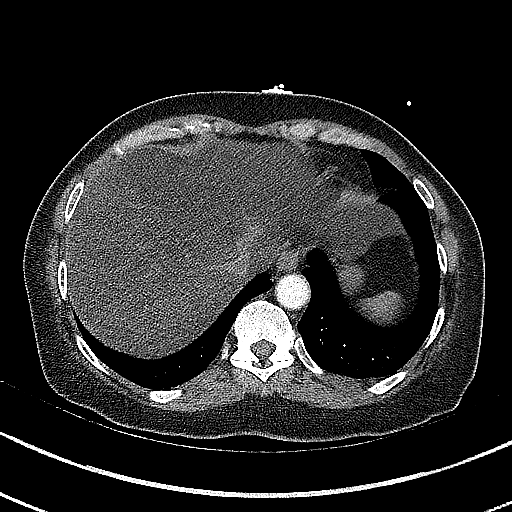
[im 45/133  mediastinal]
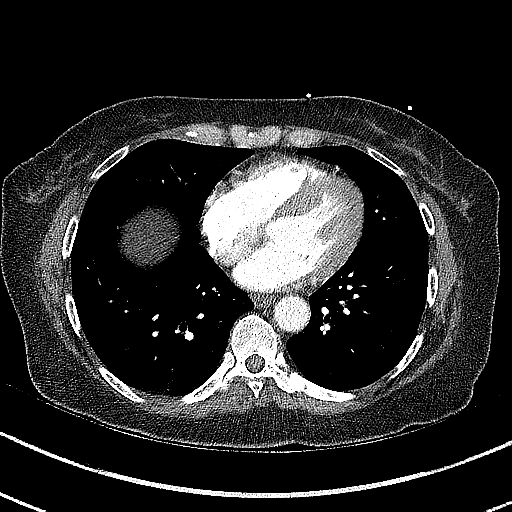
[im 67/133  mediastinal]
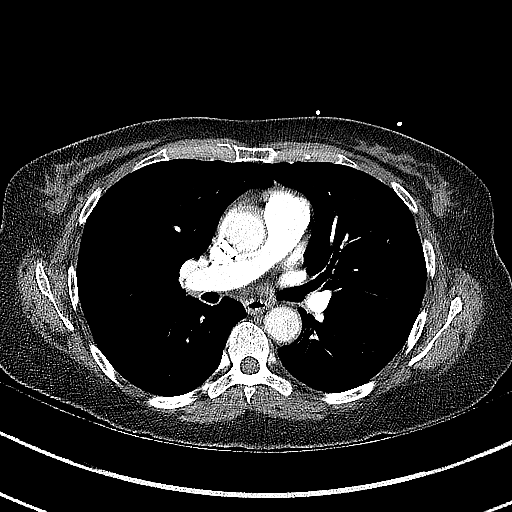
[im 89/133  mediastinal]
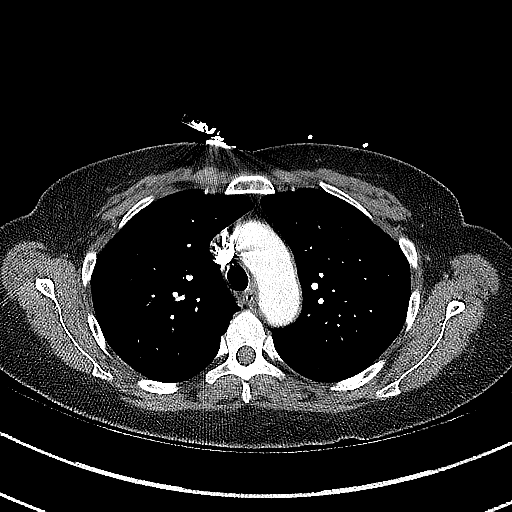
[im 111/133  mediastinal]
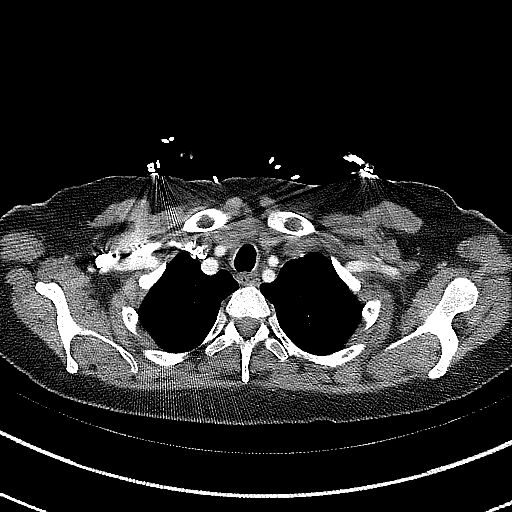

[Series 6: pe thins · axial · 0.62mm/px · z∈[+895,+1136]mm · 12 of 285 slices shown]
[im 22/285  lung]
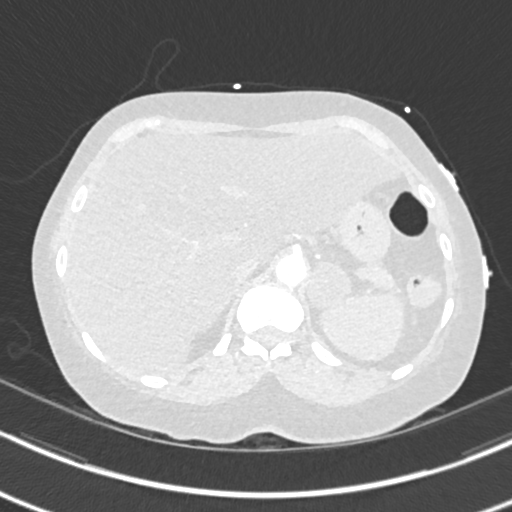
[im 44/285  mediastinal]
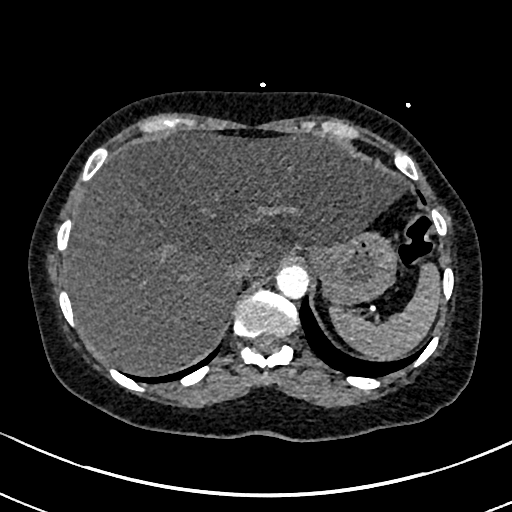
[im 66/285  lung]
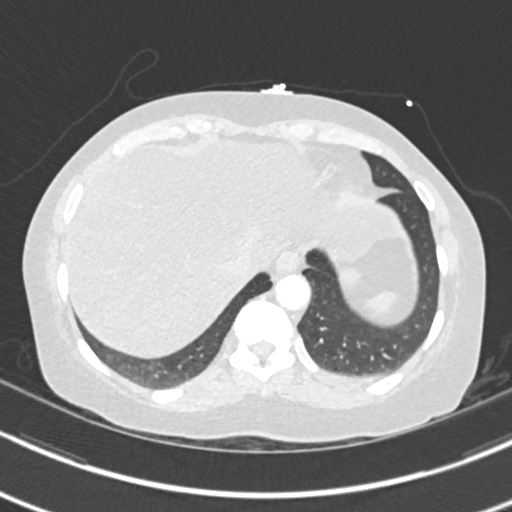
[im 88/285  mediastinal]
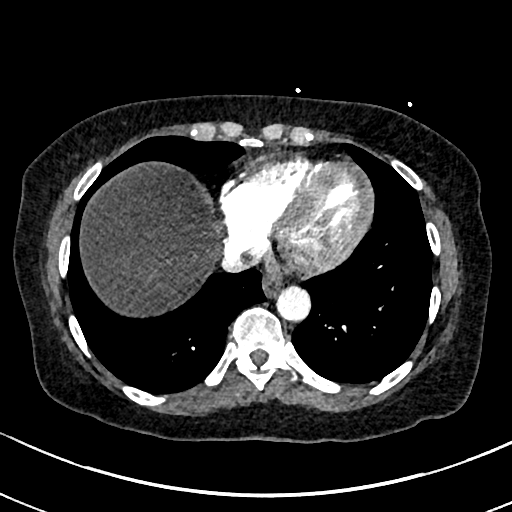
[im 110/285  lung]
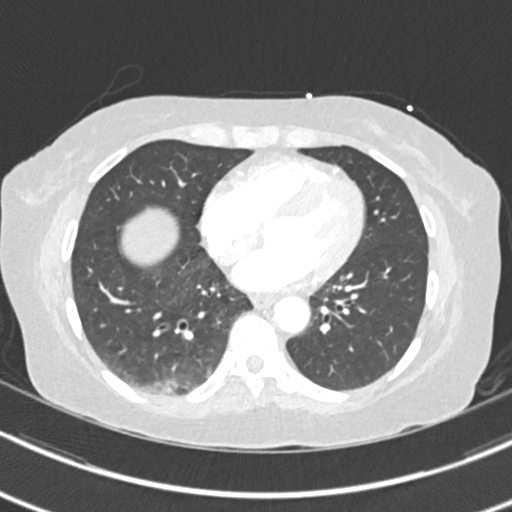
[im 132/285  mediastinal]
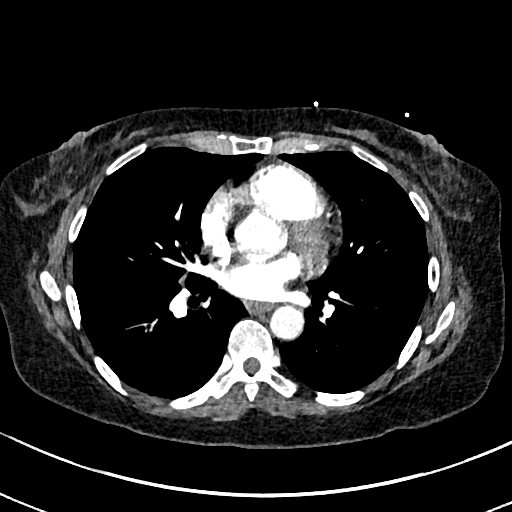
[im 153/285  lung]
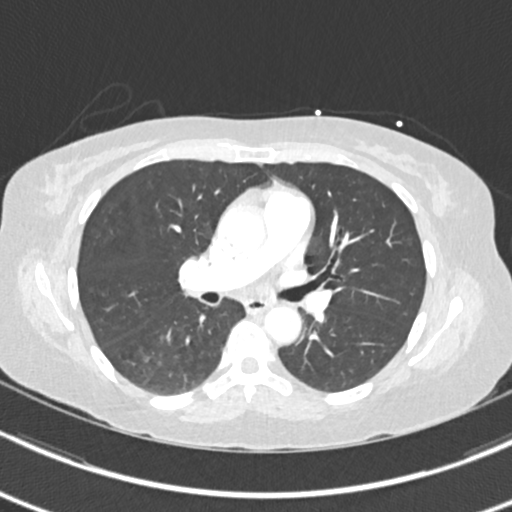
[im 175/285  mediastinal]
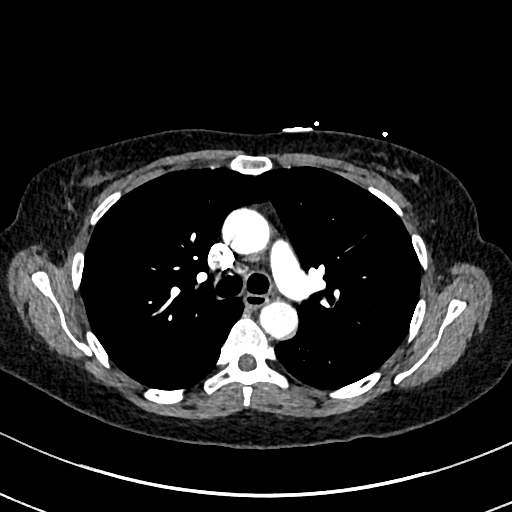
[im 197/285  lung]
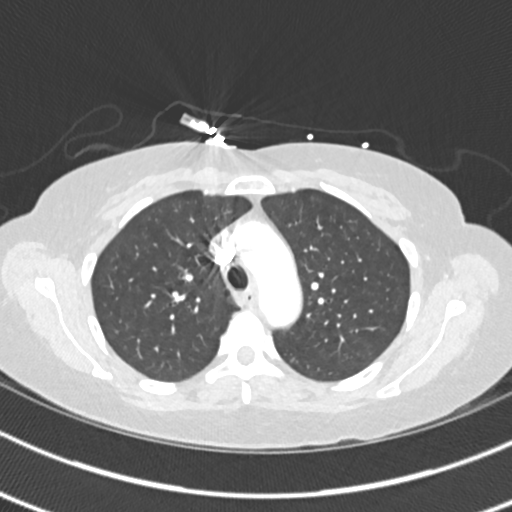
[im 219/285  mediastinal]
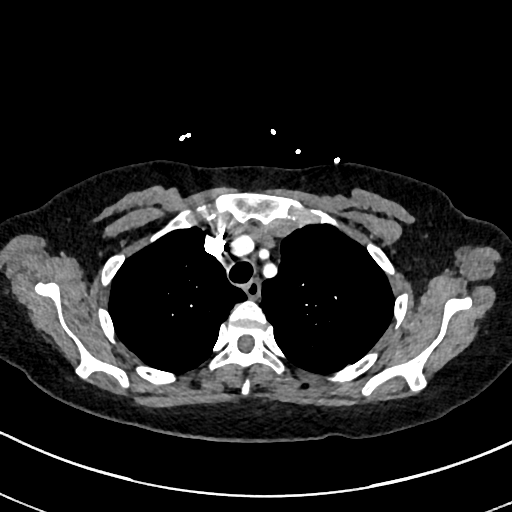
[im 241/285  lung]
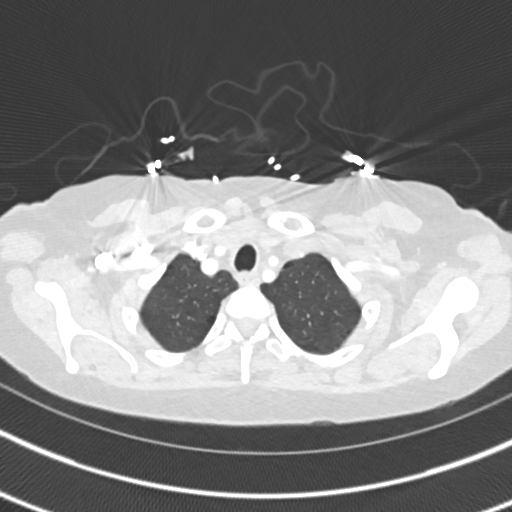
[im 263/285  mediastinal]
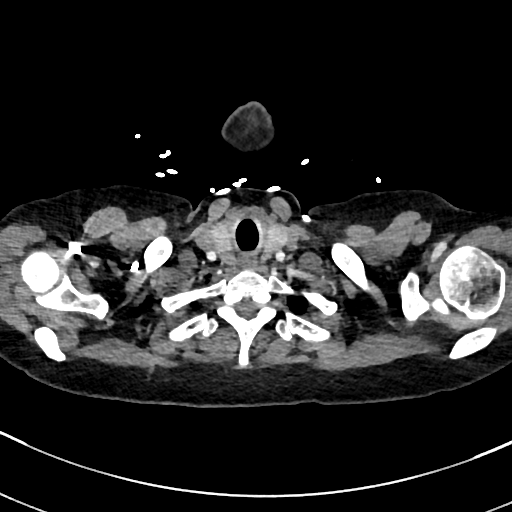

[Series 7: pe 2mm cor · coronal · 0.55mm/px · 1 of 115 slices shown]
[im 58/115  mediastinal]
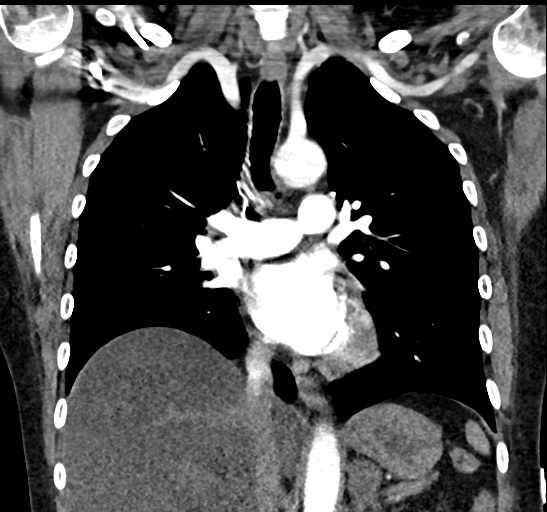

[18 of 36 positions shown; findings below may reference images not displayed]

FINDINGS: No CT evidence of pulmonary embolism.

There is a focal area of subpleural nodular and ground-glass density
at the right lung base concerning for developing pneumonia. Focal
pulmonary infarct is less likely in the absence of pulmonary
embolism. Clinical correlation and follow-up to resolution
recommended. The remainder of the lungs are clear. There is no
pleural effusion or pneumothorax. The central airways are patent.

The thoracic aorta appears unremarkable. The origins of the great
vessels of the aortic arch appear patent.

There is no cardiomegaly or pericardial effusion. No hilar or
mediastinal adenopathy. The esophagus is grossly unremarkable. No
thyroid nodules identified.

There is no axillary adenopathy. The chest wall soft tissues appear
unremarkable. The osseous structures are intact.

Diffuse fatty infiltration of the liver. A 2.5 cm left adrenal
adenoma. The visualized upper abdomen is otherwise unremarkable.
IMPRESSION: No CT evidence of pulmonary embolism.

Small focal subpleural density at the right lung base concerning for
pneumonia. Clinical correlation and follow-up to resolution
recommended.

## 2016-12-22 ENCOUNTER — Inpatient Hospital Stay (HOSPITAL_COMMUNITY)
Admission: EM | Admit: 2016-12-22 | Discharge: 2016-12-28 | DRG: 641 | Disposition: A | Discharge status: Skilled Nursing Facility | Payer: 59 | Attending: Family Medicine | Admitting: Family Medicine

## 2016-12-22 ENCOUNTER — Encounter (HOSPITAL_COMMUNITY): Payer: Self-pay | Admitting: Emergency Medicine

## 2016-12-22 ENCOUNTER — Emergency Department (HOSPITAL_COMMUNITY): Payer: 59

## 2016-12-22 DIAGNOSIS — R7989 Other specified abnormal findings of blood chemistry: Secondary | ICD-10-CM | POA: Diagnosis not present

## 2016-12-22 DIAGNOSIS — J45909 Unspecified asthma, uncomplicated: Secondary | ICD-10-CM | POA: Diagnosis present

## 2016-12-22 DIAGNOSIS — Z79899 Other long term (current) drug therapy: Secondary | ICD-10-CM

## 2016-12-22 DIAGNOSIS — E876 Hypokalemia: Secondary | ICD-10-CM | POA: Diagnosis present

## 2016-12-22 DIAGNOSIS — E512 Wernicke's encephalopathy: Secondary | ICD-10-CM | POA: Diagnosis present

## 2016-12-22 DIAGNOSIS — N390 Urinary tract infection, site not specified: Secondary | ICD-10-CM | POA: Diagnosis present

## 2016-12-22 DIAGNOSIS — H5500 Unspecified nystagmus: Secondary | ICD-10-CM | POA: Diagnosis present

## 2016-12-22 DIAGNOSIS — E878 Other disorders of electrolyte and fluid balance, not elsewhere classified: Secondary | ICD-10-CM | POA: Diagnosis present

## 2016-12-22 DIAGNOSIS — R945 Abnormal results of liver function studies: Secondary | ICD-10-CM

## 2016-12-22 DIAGNOSIS — R4182 Altered mental status, unspecified: Secondary | ICD-10-CM | POA: Diagnosis present

## 2016-12-22 DIAGNOSIS — F17210 Nicotine dependence, cigarettes, uncomplicated: Secondary | ICD-10-CM | POA: Diagnosis present

## 2016-12-22 DIAGNOSIS — F1020 Alcohol dependence, uncomplicated: Secondary | ICD-10-CM | POA: Diagnosis present

## 2016-12-22 DIAGNOSIS — D638 Anemia in other chronic diseases classified elsewhere: Secondary | ICD-10-CM | POA: Diagnosis present

## 2016-12-22 DIAGNOSIS — R410 Disorientation, unspecified: Secondary | ICD-10-CM

## 2016-12-22 DIAGNOSIS — F10288 Alcohol dependence with other alcohol-induced disorder: Secondary | ICD-10-CM | POA: Diagnosis not present

## 2016-12-22 HISTORY — DX: Alcohol dependence, uncomplicated: F10.20

## 2016-12-22 LAB — RAPID URINE DRUG SCREEN, HOSP PERFORMED
AMPHETAMINES: NOT DETECTED
Barbiturates: NOT DETECTED
Benzodiazepines: NOT DETECTED
Cocaine: NOT DETECTED
Opiates: NOT DETECTED
TETRAHYDROCANNABINOL: NOT DETECTED

## 2016-12-22 LAB — COMPREHENSIVE METABOLIC PANEL
ALBUMIN: 3.8 g/dL (ref 3.5–5.0)
ALT: 52 U/L (ref 14–54)
ANION GAP: 16 — AB (ref 5–15)
AST: 154 U/L — AB (ref 15–41)
Alkaline Phosphatase: 107 U/L (ref 38–126)
BUN: 27 mg/dL — AB (ref 6–20)
CO2: 30 mmol/L (ref 22–32)
Calcium: 9.9 mg/dL (ref 8.9–10.3)
Chloride: 89 mmol/L — ABNORMAL LOW (ref 101–111)
Creatinine, Ser: 0.76 mg/dL (ref 0.44–1.00)
GFR calc Af Amer: >60 mL/min (ref >60)
GFR calc non Af Amer: >60 mL/min (ref >60)
GLUCOSE: 102 mg/dL — AB (ref 65–99)
POTASSIUM: 2.6 mmol/L — AB (ref 3.5–5.1)
SODIUM: 135 mmol/L (ref 135–145)
Total Bilirubin: 1.6 mg/dL — ABNORMAL HIGH (ref 0.3–1.2)
Total Protein: 7.2 g/dL (ref 6.5–8.1)

## 2016-12-22 LAB — URINALYSIS, ROUTINE W REFLEX MICROSCOPIC
GLUCOSE, UA: NEGATIVE mg/dL
KETONES UR: 20 mg/dL — AB
NITRITE: NEGATIVE
PROTEIN: 100 mg/dL — AB
Specific Gravity, Urine: 1.024 (ref 1.005–1.030)
pH: 5 (ref 5.0–8.0)

## 2016-12-22 LAB — CBC WITH DIFFERENTIAL/PLATELET
BASOS ABS: 0 10*3/uL (ref 0.0–0.1)
BASOS PCT: 1 %
EOS PCT: 0 %
Eosinophils Absolute: 0 10*3/uL (ref 0.0–0.7)
HEMATOCRIT: 27.6 % — AB (ref 36.0–46.0)
Hemoglobin: 9.7 g/dL — ABNORMAL LOW (ref 12.0–15.0)
LYMPHS PCT: 30 %
Lymphs Abs: 2.3 10*3/uL (ref 0.7–4.0)
MCH: 32.4 pg (ref 26.0–34.0)
MCHC: 35.1 g/dL (ref 30.0–36.0)
MCV: 92.3 fL (ref 78.0–100.0)
MONO ABS: 0.5 10*3/uL (ref 0.1–1.0)
Monocytes Relative: 6 %
NEUTROS ABS: 4.9 10*3/uL (ref 1.7–7.7)
Neutrophils Relative %: 63 %
PLATELETS: 274 10*3/uL (ref 150–400)
RBC: 2.99 MIL/uL — AB (ref 3.87–5.11)
RDW: 16 % — AB (ref 11.5–15.5)
WBC: 7.7 10*3/uL (ref 4.0–10.5)

## 2016-12-22 LAB — I-STAT CHEM 8, ED
BUN: 24 mg/dL — ABNORMAL HIGH (ref 6–20)
CHLORIDE: 89 mmol/L — AB (ref 101–111)
Calcium, Ion: 1.13 mmol/L — ABNORMAL LOW (ref 1.15–1.40)
Creatinine, Ser: 0.6 mg/dL (ref 0.44–1.00)
Glucose, Bld: 102 mg/dL — ABNORMAL HIGH (ref 65–99)
HEMATOCRIT: 31 % — AB (ref 36.0–46.0)
HEMOGLOBIN: 10.5 g/dL — AB (ref 12.0–15.0)
POTASSIUM: 2.6 mmol/L — AB (ref 3.5–5.1)
SODIUM: 133 mmol/L — AB (ref 135–145)
TCO2: 30 mmol/L (ref 0–100)

## 2016-12-22 LAB — MAGNESIUM: MAGNESIUM: 1.8 mg/dL (ref 1.7–2.4)

## 2016-12-22 LAB — ETHANOL: Alcohol, Ethyl (B): <5 mg/dL (ref <5)

## 2016-12-22 LAB — AMMONIA: Ammonia: 32 umol/L (ref 9–35)

## 2016-12-22 MED ORDER — ACETAMINOPHEN 325 MG PO TABS: 650.0000 mg | ORAL_TABLET | Freq: Four times a day (QID) | ORAL | Status: DC | PRN

## 2016-12-22 MED ORDER — SODIUM CHLORIDE 0.9 % IV SOLN: Freq: Once | INTRAVENOUS | Status: DC

## 2016-12-22 MED ORDER — THIAMINE HCL 100 MG/ML IJ SOLN
100.0000 mg | Freq: Once | INTRAMUSCULAR | Status: AC
  Administered 2016-12-22: 100 mg via INTRAVENOUS
  Filled 2016-12-22: qty 2

## 2016-12-22 MED ORDER — THIAMINE HCL 100 MG/ML IJ SOLN
400.0000 mg | Freq: Once | INTRAMUSCULAR | Status: AC
  Administered 2016-12-22: 400 mg via INTRAVENOUS
  Filled 2016-12-22: qty 4

## 2016-12-22 MED ORDER — LORAZEPAM 1 MG PO TABS
1.0000 mg | ORAL_TABLET | Freq: Four times a day (QID) | ORAL | Status: AC | PRN
  Filled 2016-12-22: qty 1

## 2016-12-22 MED ORDER — ONDANSETRON HCL 4 MG PO TABS: 4.0000 mg | ORAL_TABLET | Freq: Four times a day (QID) | ORAL | Status: DC | PRN

## 2016-12-22 MED ORDER — ENSURE ENLIVE PO LIQD
237.0000 mL | Freq: Two times a day (BID) | ORAL | Status: DC
  Administered 2016-12-23 – 2016-12-28 (×8): 237 mL via ORAL

## 2016-12-22 MED ORDER — POTASSIUM CHLORIDE CRYS ER 20 MEQ PO TBCR
40.0000 meq | EXTENDED_RELEASE_TABLET | Freq: Once | ORAL | Status: AC
  Administered 2016-12-22: 40 meq via ORAL
  Filled 2016-12-22: qty 2

## 2016-12-22 MED ORDER — SODIUM CHLORIDE 0.9% FLUSH
3.0000 mL | Freq: Two times a day (BID) | INTRAVENOUS | Status: DC
  Administered 2016-12-24 – 2016-12-27 (×5): 3 mL via INTRAVENOUS

## 2016-12-22 MED ORDER — THIAMINE HCL 100 MG/ML IJ SOLN
250.0000 mg | Freq: Every day | INTRAMUSCULAR | Status: DC
  Administered 2016-12-24 – 2016-12-27 (×4): 250 mg via INTRAVENOUS
  Filled 2016-12-22 (×4): qty 4

## 2016-12-22 MED ORDER — FOLIC ACID 1 MG PO TABS
1.0000 mg | ORAL_TABLET | Freq: Every day | ORAL | Status: DC
  Administered 2016-12-23 – 2016-12-28 (×6): 1 mg via ORAL
  Filled 2016-12-22 (×6): qty 1

## 2016-12-22 MED ORDER — DOCUSATE SODIUM 100 MG PO CAPS
100.0000 mg | ORAL_CAPSULE | Freq: Two times a day (BID) | ORAL | Status: DC
  Administered 2016-12-23 – 2016-12-28 (×10): 100 mg via ORAL
  Filled 2016-12-22 (×11): qty 1

## 2016-12-22 MED ORDER — SODIUM CHLORIDE 0.9 % IV BOLUS (SEPSIS)
1000.0000 mL | Freq: Once | INTRAVENOUS | Status: AC
  Administered 2016-12-22: 1000 mL via INTRAVENOUS

## 2016-12-22 MED ORDER — LORAZEPAM 2 MG/ML IJ SOLN
0.5000 mg | Freq: Once | INTRAMUSCULAR | Status: AC
  Administered 2016-12-22: 0.5 mg via INTRAVENOUS
  Filled 2016-12-22: qty 1

## 2016-12-22 MED ORDER — ENOXAPARIN SODIUM 40 MG/0.4ML ~~LOC~~ SOLN
40.0000 mg | Freq: Every day | SUBCUTANEOUS | Status: DC
  Administered 2016-12-23 – 2016-12-27 (×5): 40 mg via SUBCUTANEOUS
  Filled 2016-12-22 (×5): qty 0.4

## 2016-12-22 MED ORDER — ACETAMINOPHEN 650 MG RE SUPP: 650.0000 mg | Freq: Four times a day (QID) | RECTAL | Status: DC | PRN

## 2016-12-22 MED ORDER — THIAMINE HCL 100 MG/ML IJ SOLN
500.0000 mg | Freq: Three times a day (TID) | INTRAVENOUS | Status: AC
  Administered 2016-12-23 – 2016-12-24 (×6): 500 mg via INTRAVENOUS
  Filled 2016-12-22: qty 3
  Filled 2016-12-22 (×4): qty 5
  Filled 2016-12-22: qty 4

## 2016-12-22 MED ORDER — SODIUM CHLORIDE 0.9 % IV SOLN: INTRAVENOUS | Status: DC

## 2016-12-22 MED ORDER — POTASSIUM CHLORIDE 10 MEQ/100ML IV SOLN
10.0000 meq | INTRAVENOUS | Status: AC
  Administered 2016-12-23 (×2): 10 meq via INTRAVENOUS
  Filled 2016-12-22 (×2): qty 100

## 2016-12-22 MED ORDER — ADULT MULTIVITAMIN W/MINERALS CH
1.0000 | ORAL_TABLET | Freq: Every day | ORAL | Status: DC
  Administered 2016-12-23 – 2016-12-28 (×6): 1 via ORAL
  Filled 2016-12-22 (×6): qty 1

## 2016-12-22 MED ORDER — ONDANSETRON HCL 4 MG/2ML IJ SOLN: 4.0000 mg | Freq: Four times a day (QID) | INTRAMUSCULAR | Status: DC | PRN

## 2016-12-22 MED ORDER — POTASSIUM CHLORIDE 10 MEQ/100ML IV SOLN
10.0000 meq | INTRAVENOUS | Status: AC
  Administered 2016-12-22 – 2016-12-23 (×5): 10 meq via INTRAVENOUS
  Filled 2016-12-22 (×6): qty 100

## 2016-12-22 MED ORDER — LORAZEPAM 2 MG/ML IJ SOLN
1.0000 mg | Freq: Four times a day (QID) | INTRAMUSCULAR | Status: AC | PRN
  Administered 2016-12-23 (×2): 1 mg via INTRAVENOUS
  Filled 2016-12-22 (×2): qty 1

## 2016-12-22 NOTE — ED Notes (Signed)
Pt in CT.

## 2016-12-22 NOTE — ED Triage Notes (Addendum)
Patient c/o numbness/weakness in bilat legs that has been going on for over 2 weeks.  Patient states that symptoms are intermittent.  Patient family states that she drinks ETOH heavy enough that you can smell ETOH on patient and patient is probably malnourished.  Patient states that she bought one beer the other day and took 3 days to drink it all.   Patient states that she has depression but no current thoughts of wanting to harm herself.

## 2016-12-22 NOTE — H&P (Signed)
History and Physical    Virginia Hess UJW:[id number] DOB: 05/04/1966 DOA: 12/22/2016  PCP: No PCP Per Patient - "I had one and I let him go.  Long story." Consultants:  None Patient coming from: Home - lives alone; [place]: daughter, [phone]  Chief Complaint: leg weakness  HPI: Virginia Hess is a 51 y.o. female with medical history significant of alcohol dependence presenting with leg weakness.  When asked what brought her in today, she replied "I went to work and, um, I went to work and [employer], uh, and there has been so much going on and so uh.  So anyway, I went to work.  And you know.  And it's just... But see, that's what it was before.  When I was here the first time that I was here.  My legs were, my legs were messed up.  I couldn't even handle steps to come up the steps to come up into the building.  But. So. Uh."  Last ETOH - "uh. That would have been , it might have been yesterday."  She usually drinks "you know what, I could say that usually, I could drink about, I usually could drink about 4-5 cans of beer and then cook and then sit down eat and but then it's now you know."  After dinner she reports that she usually drinks a cup of cold iced tea and "then come on in here, be at home, get in the bed, go to sleep."  ED Course:  Concern for Wernicke's encephalopathy based on AMS, nystagmus, and inability to stand/imbalance; given 500 mg IV Thiamine.  Also IL bolus and then 125 cc/hour.  K+ of 2.6 treated with 40 mEq K-Dur.  Review of Systems: As per HPI; otherwise 10 point review of systems reviewed and negative.   Ambulatory Status:  "I have a walker.  I need a cane."  Past Medical History:  Diagnosis Date  . Alcohol dependence (HCC)    PMH - "Because as far as steps go, I can't.  I let everyone else go ahead because I know it's gonna take me 5, 6, 7 minutes to get up the steps because I know ya'll want to get on out of here but I'm gonna do it at my own pace because that's how I  do it."  FH - "My father is down here.  Nursing home down here.  My mother is in [place].  She stays with my sister, so yeah."  SH - "Lab tech 2."  Where? "You know where uh, you know where uh..." Silence.  Asked again.  "[employer]".  Works daily M-F.   History reviewed. No pertinent surgical history.  Social History   Social History  . Marital status: Single    Spouse name: N/A  . Number of children: N/A  . Years of education: N/A   Occupational History  . Not on file.   Social History Main Topics  . Smoking status: Current Every Day Smoker    Packs/day: 0.50    Years: 35.00    Types: Cigarettes  . Smokeless tobacco: Never Used  . Alcohol use Yes     Comment: daily  . Drug use: No  . Sexual activity: Not on file   Other Topics Concern  . Not on file   Social History Narrative  . No narrative on file    No Known Allergies  History reviewed. No pertinent family history.  Prior to Admission medications   Medication Sig  Start Date End Date Taking? Authorizing Provider  acetaminophen (TYLENOL) 500 MG tablet Take 500 mg by mouth every 6 (six) hours as needed for moderate pain.   Yes Historical Provider, MD  albuterol (PROVENTIL HFA;VENTOLIN HFA) 108 (90 Base) MCG/ACT inhaler Inhale 2 puffs into the lungs every 4 (four) hours as needed for wheezing or shortness of breath. 07/05/16   Kathlen Mody, MD  feeding supplement, ENSURE ENLIVE, (ENSURE ENLIVE) LIQD Take 237 mLs by mouth 2 (two) times daily between meals. 07/05/16   Kathlen Mody, MD  folic acid (FOLVITE) 1 MG tablet Take 1 tablet (1 mg total) by mouth daily. 07/06/16   Kathlen Mody, MD  Multiple Vitamin (MULTIVITAMIN WITH MINERALS) TABS tablet Take 1 tablet by mouth daily. 07/06/16   Kathlen Mody, MD  thiamine 250 MG tablet Take 1 tablet (250 mg total) by mouth daily. 07/06/16   Kathlen Mody, MD    Physical Exam: Vitals:   12/22/16 1511 12/22/16 1747 12/22/16 2029 12/22/16 2246  BP: (!) 151/82 113/82  (!) 141/93 115/86  Pulse: (!) 115 (!) 110 (!) 125 100  Resp: Temp: 98.8 F (37.1 C)     TempSrc: Oral     SpO2: 100% 100% 96% 98%  Weight: 58.1 kg (128 lb)     Height:  (1.651 m)        General: Wearing sunglasses.  Significant difficulty completing sentences.  Clearly not completely cognizant. Eyes:  PERRL, +mild intermittent horizontal nystagmus, normal lids, iris ENT:  grossly normal hearing, lips & tongue, mmm Neck:  no LAD, masses or thyromegaly Cardiovascular:  Mild tachycardia, no m/r/g. No LE edema.  Respiratory:  CTA bilaterally, no w/r/r. Normal respiratory effort. Abdomen:  soft, ntnd, NABS Skin:  no rash or induration seen on limited exam Musculoskeletal:  grossly normal tone BUE/BLE, good ROM, no bony abnormality Psychiatric: grossly normal mood and affect, speech slowed, difficulty completing sentences Neurologic:  CN 2-12 grossly intact, moves all extremities in coordinated fashion, sensation intact, difficulty with balance/standing per ER physician  Labs on Admission: I have personally reviewed following labs and imaging studies  CBC:  Recent Labs Lab 12/22/16 1720 12/22/16 1732  WBC 7.7  --   NEUTROABS 4.9  --   HGB 9.7* 10.5*  HCT 27.6* 31.0*  MCV 92.3  --   PLT 274  --    Basic Metabolic Panel:  Recent Labs Lab 12/22/16 1720 12/22/16 1732  NA 135 133*  K 2.6* 2.6*  CL 89* 89*  CO2 30  --   GLUCOSE 102* 102*  BUN 27* 24*  CREATININE 0.76 0.60  CALCIUM 9.9  --   MG 1.8  --    GFR: Estimated Creatinine Clearance: 74.9 mL/min (by C-G formula based on SCr of 0.6 mg/dL). Liver Function Tests:  Recent Labs Lab 12/22/16 1720  AST 154*  ALT 52  ALKPHOS 107  BILITOT 1.6*  PROT 7.2  ALBUMIN 3.8   No results for input(s): LIPASE, AMYLASE in the last 168 hours.  Recent Labs Lab 12/22/16 1942  AMMONIA 32   Coagulation Profile: No results for input(s): INR, PROTIME in the last 168 hours. Cardiac Enzymes: No results  for input(s): CKTOTAL, CKMB, CKMBINDEX, TROPONINI in the last 168 hours. BNP (last 3 results) No results for input(s): PROBNP in the last 8760 hours. HbA1C: No results for input(s): HGBA1C in the last 72 hours. CBG: No results for input(s): GLUCAP in the last 168 hours. Lipid Profile: No results for  input(s): CHOL, HDL, LDLCALC, TRIG, CHOLHDL, LDLDIRECT in the last 72 hours. Thyroid Function Tests: No results for input(s): TSH, T4TOTAL, FREET4, T3FREE, THYROIDAB in the last 72 hours. Anemia Panel: No results for input(s): VITAMINB12, FOLATE, FERRITIN, TIBC, IRON, RETICCTPCT in the last 72 hours. Urine analysis:    Component Value Date/Time   COLORURINE AMBER (A) 12/22/2016 1903   APPEARANCEUR HAZY (A) 12/22/2016 1903   LABSPEC 1.024 12/22/2016 1903   PHURINE 5.0 12/22/2016 1903   GLUCOSEU NEGATIVE 12/22/2016 1903   HGBUR SMALL (A) 12/22/2016 1903   BILIRUBINUR MODERATE (A) 12/22/2016 1903   KETONESUR 20 (A) 12/22/2016 1903   PROTEINUR 100 (A) 12/22/2016 1903   UROBILINOGEN 1.0 10/26/2007 1330   NITRITE NEGATIVE 12/22/2016 1903   LEUKOCYTESUR MODERATE (A) 12/22/2016 1903    Creatinine Clearance: Estimated Creatinine Clearance: 74.9 mL/min (by C-G formula based on SCr of 0.6 mg/dL).  Sepsis Labs: (procalcitonin:4,lacticidven:4) )No results found for this or any previous visit (from the past 240 hour(s)).   Radiological Exams on Admission: Ct Head Wo Contrast  Result Date: 12/22/2016 CLINICAL DATA:  Numbness and weakness in both legs for over 2 weeks. Alcohol abuse. EXAM: CT HEAD WITHOUT CONTRAST TECHNIQUE: Contiguous axial images were obtained from the base of the skull through the vertex without intravenous contrast. COMPARISON:  CT head without contrast 10/26/2007 FINDINGS: Brain: Mild generalized atrophy is present. Atrophy of the superior vermis is noted. No acute infarct, hemorrhage, or mass lesion is present. The ventricles are of normal size. No significant  extraaxial fluid collection is present. Vascular: No hyperdense vessel or unexpected calcification. Skull: No focal lytic or blastic lesions are present. Calvarium is intact. Sinuses/Orbits: The paranasal sinuses and mastoid air cells are clear. The visualized orbits are normal. IMPRESSION: 1. No acute intracranial abnormality. 2. Mild generalized atrophy and superior vermian atrophy may be related to alcohol use. Electronically Signed   By: Marin Roberts M.D.   On: 12/22/2016 19:39    EKG: not done, ordered  Assessment/Plan Principal Problem:   Wernicke's encephalopathy Active Problems:   Alcohol dependence (HCC)   Hypokalemia   Elevated LFTs   UTI (urinary tract infection)   Anemia of chronic disease   Wernicke's encephalopathy/ETOH dependence -Patient presenting with 3/4 of the Caine criteria: encephalopathy, oculomotor dysfunction (nystagmus), and cerebellar dysfunction (gait ataxia).   -Interestingly, she does not have dietary deficiency - despite an Albumin of 2 in 10/17, it is normal today. -Patient with long-standing ETOH dependence and recent resumption of use after cessation. -ETOH negative, reports last use yesterday but her daughter (who was not present at the time of my evaluation and who does not live with her reported last use may be 3 days ago). -UDS negative -NH4 32 -Will admit with telemetry monitoring for at least 24 hours -CIWA protocol -PO folate and MVI -However, PO absorption of thiamine is insufficient in acute WE and untreated WE can lead to coma and death -Recommended treatment course is 500 mg IV thiamine TID x 2 days and then 250 mg IV x 5 days and then PO thiamine 100 mg daily -Patient voiced concern about her eyes and so apparently does recognize the oculomotor dysfunction at least to some extent; would anticipate improvement in 1-2 days with eye symptoms -Will obtain MRI for further evaluation although this is unlikely to alter our current treatment  regimen (WE is not always visible on imaging studies) -ETOH cessation encouraged -Anion gap 16, anticipate resolution with ongoing IVF -Consider neurology consult in AM  UTI vs. Asymptomatic bacteriuria -UA rare bacteria, small Hgb, moderate LE, negative nitrite, +mucous, TNTC WBC -Denies symptoms but her AMS could be impacted by a UTI -Consider treatment if encephalopathy does not completely resolve and/or patient complains of urinary symptoms  Hypokalemia -K 2.6 -Also with hypochloremia, Chloride 89 -Given 40 mEq K-Dur PO in ER, which would be expected to raise K+ to 3.0 -Will give an additional 80 mEq in 1L IVF at 125 cc/hour, which would be expected to correct K+ to 3.8 -Recheck K+ in AM  Elevated LFTs -AST 154/ALT 52 (prior 213/73) -Suspect alcoholic hepatitis and/or cirrhosis -Consider RUQ [place] -Check INR  Anemia -Hgb 9.7, stable -Will follow   DVT prophylaxis: Lovenox Code Status:  Full - confirmed with patient Family Communication: None present Disposition Plan: To be determined Consults called: None Admission status: Admit - It is my clinical opinion that admission to INPATIENT is reasonable and necessary because this patient will require at least 2 midnights in the hospital to treat this condition based on the medical complexity of the problems presented.  Given the aforementioned information, the predictability of an adverse outcome is felt to be significant.    Jonah Blue MD [facility]  If 7PM-7AM, please contact night-coverage [email or web address] Password [email or web address]  12/22/2016, 10:50 PM

## 2016-12-22 NOTE — ED Provider Notes (Addendum)
[facility]-EMERGENCY DEPT Provider Note   CSN: [id number] Arrival date & time: 12/22/16  1321     History   Chief Complaint Chief Complaint  Patient presents with  . Numbness/weakness in legs    HPI Virginia Hess is a 51 y.o. female.  CC: leg weakness  Onset/Duration: 6 days ago Timing: once Location: BLE Quality: weakness Severity: mild Modifying Factors:  Improved by: self resolved  Worsened by: nothing Associated Signs/Symptoms:  Pertinent (+): N/A  Pertinent (-): fever, chills, N/V/D, chest pain, SOB, focal weakness, light headed ness Context: states that she was getting up from her couch 6 days ago and felt her legs go weak. She fell to the floor, but denies trauma. It has not recurred since that one episode.    Also reports having light sensitivity for 2 weeks. No associated headache. Has not been evaluated for it.  Endorses almost daily drinking. No tremors, diaphoresis, palpitations, fevers, hallucinations, or seizures.   Daughter states that she was a recovering alcoholic that started drinking again in Aug. Daughter also endorses confusion for 2 days.   The history is provided by the patient.    Past Medical History:  Diagnosis Date  . Alcohol dependence [facility])     Patient Active Problem List   Diagnosis Date Noted  . Wernicke's encephalopathy 12/22/2016  . Anemia of chronic disease 12/22/2016  . Alcohol dependence (HCC) 06/29/2016  . Hypokalemia 06/29/2016  . Hypomagnesemia 06/29/2016  . Lactic acid acidosis 06/29/2016  . Elevated LFTs 06/29/2016  . UTI (urinary tract infection) 06/29/2016  . Depressed 06/29/2016    History reviewed. No pertinent surgical history.  OB History    No data available       Home Medications    Prior to Admission medications   Medication Sig Start Date End Date Taking? Authorizing Provider  acetaminophen (TYLENOL) 500 MG tablet Take 500 mg by mouth every 6 (six) hours as needed for moderate pain.   Yes  Historical Provider, MD  albuterol (PROVENTIL HFA;VENTOLIN HFA) 108 (90 Base) MCG/ACT inhaler Inhale 2 puffs into the lungs every 4 (four) hours as needed for wheezing or shortness of breath. 07/05/16   Kathlen Mody, MD  feeding supplement, ENSURE ENLIVE, (ENSURE ENLIVE) LIQD Take 237 mLs by mouth 2 (two) times daily between meals. 07/05/16   Kathlen Mody, MD  folic acid (FOLVITE) 1 MG tablet Take 1 tablet (1 mg total) by mouth daily. 07/06/16   Kathlen Mody, MD  Multiple Vitamin (MULTIVITAMIN WITH MINERALS) TABS tablet Take 1 tablet by mouth daily. 07/06/16   Kathlen Mody, MD  thiamine 250 MG tablet Take 1 tablet (250 mg total) by mouth daily. 07/06/16   Kathlen Mody, MD    Family History History reviewed. No pertinent family history.  Social History Social History  Substance Use Topics  . Smoking status: Current Every Day Smoker    Packs/day: 0.50    Years: 35.00    Types: Cigarettes  . Smokeless tobacco: Never Used  . Alcohol use Yes     Comment: daily     Allergies   Patient has no known allergies.   Review of Systems Review of Systems All other systems are reviewed and are negative for acute change except as noted in the HPI   Physical Exam Updated Vital Signs BP (!) 151/82 (BP Location: Right Wrist)   Pulse (!) 115   Temp 98.8 F (37.1 C) (Oral)   Resp 17   Ht  (1.651 m)   Wt  128 lb (58.1 kg)   SpO2 100%   BMI 21.30 kg/m   Physical Exam  Constitutional: She is oriented to person, place, and time. She appears well-developed and well-nourished. No distress.  HENT:  Head: Normocephalic and atraumatic.  Nose: Nose normal.  Eyes: Conjunctivae and EOM are normal. Pupils are equal, round, and reactive to light. Right eye exhibits no discharge. Left eye exhibits no discharge. No scleral icterus.  Neck: Normal range of motion. Neck supple.  Cardiovascular: Normal rate and regular rhythm.  Exam reveals no gallop and no friction rub.   No murmur  heard. Pulmonary/Chest: Effort normal and breath sounds normal. No stridor. No respiratory distress. She has no rales.  Abdominal: Soft. She exhibits no distension. There is no tenderness.  Musculoskeletal: She exhibits no edema or tenderness.  Neurological: She is alert and oriented to person, place, and time.  Mental Status: Alert and oriented to person, place, and time. Attention and concentration normal. Speech clear. Recent memory is intact  Cranial Nerves  II Visual Fields: Intact to confrontation. Visual fields intact. III, IV, VI: Pupils equal and reactive to light and near. Full eye movement with bilateral nystagmus  V Facial Sensation: Normal. No weakness of masticatory muscles  VII: No facial weakness or asymmetry  VIII Auditory Acuity: Grossly normal  IX/X: The uvula is midline; the palate elevates symmetrically  XI: Normal sternocleidomastoid and trapezius strength  XII: The tongue is midline. No atrophy or fasciculations.   Motor System: Muscle Strength: 5/5 and symmetric in the upper and lower extremities. No pronation or drift.  Muscle Tone: Tone and muscle bulk are normal in the upper and lower extremities.   Reflexes: DTRs: 1+ and symmetrical in all four extremities. Plantar responses are flexor bilaterally.  Coordination: Intact finger-to-nose, heel-to-shin. No tremor.  Sensation: Intact to light touch, and pinprick. Proprioception intact. Unable to perform Romberg test.  Gait: unable to stand   Skin: Skin is warm and dry. No rash noted. She is not diaphoretic. No erythema.  Psychiatric: She has a normal mood and affect.  Vitals reviewed.    ED Treatments / Results  Labs (all labs ordered are listed, but only abnormal results are displayed) Labs Reviewed  CBC WITH DIFFERENTIAL/PLATELET - Abnormal; Notable for the following:       Result Value   RBC 2.99 (*)    Hemoglobin 9.7 (*)    HCT 27.6 (*)    RDW 16.0 (*)    All other components within normal limits   COMPREHENSIVE METABOLIC PANEL - Abnormal; Notable for the following:    Potassium 2.6 (*)    Chloride 89 (*)    Glucose, Bld 102 (*)    BUN 27 (*)    AST 154 (*)    Total Bilirubin 1.6 (*)    Anion gap 16 (*)    All other components within normal limits  URINALYSIS, ROUTINE W REFLEX MICROSCOPIC - Abnormal; Notable for the following:    Color, Urine AMBER (*)    APPearance HAZY (*)    Hgb urine dipstick SMALL (*)    Bilirubin Urine MODERATE (*)    Ketones, ur 20 (*)    Protein, ur 100 (*)    Leukocytes, UA MODERATE (*)    Bacteria, UA RARE (*)    Squamous Epithelial / LPF 6-30 (*)    All other components within normal limits  I-STAT CHEM 8, ED - Abnormal; Notable for the following:    Sodium 133 (*)  Potassium 2.6 (*)    Chloride 89 (*)    BUN 24 (*)    Glucose, Bld 102 (*)    Calcium, Ion 1.13 (*)    Hemoglobin 10.5 (*)    HCT 31.0 (*)    All other components within normal limits  MAGNESIUM  AMMONIA  ETHANOL  RAPID URINE DRUG SCREEN, HOSP PERFORMED  PROTIME-INR  TSH  HIV ANTIBODY (ROUTINE TESTING)  BASIC METABOLIC PANEL  CBC    EKG  EKG Interpretation None       Radiology Ct Head Wo Contrast  Result Date: 12/22/2016 CLINICAL DATA:  Numbness and weakness in both legs for over 2 weeks. Alcohol abuse. EXAM: CT HEAD WITHOUT CONTRAST TECHNIQUE: Contiguous axial images were obtained from the base of the skull through the vertex without intravenous contrast. COMPARISON:  CT head without contrast 10/26/2007 FINDINGS: Brain: Mild generalized atrophy is present. Atrophy of the superior vermis is noted. No acute infarct, hemorrhage, or mass lesion is present. The ventricles are of normal size. No significant extraaxial fluid collection is present. Vascular: No hyperdense vessel or unexpected calcification. Skull: No focal lytic or blastic lesions are present. Calvarium is intact. Sinuses/Orbits: The paranasal sinuses and mastoid air cells are clear. The visualized orbits  are normal. IMPRESSION: 1. No acute intracranial abnormality. 2. Mild generalized atrophy and superior vermian atrophy may be related to alcohol use. Electronically Signed   By: Marin Roberts M.D.   On: 12/22/2016 19:39    Procedures Procedures (including critical care time) CRITICAL CARE Performed by: Amadeo Garnet Arnesha Schiraldi Total critical care time: 45 minutes Critical care time was exclusive of separately billable procedures and treating other patients. Critical care was necessary to treat or prevent imminent or life-threatening deterioration. Critical care was time spent personally by me on the following activities: development of treatment plan with patient and/or surrogate as well as nursing, discussions with consultants, evaluation of patient's response to treatment, examination of patient, obtaining history from patient or surrogate, ordering and performing treatments and interventions, ordering and review of laboratory studies, ordering and review of radiographic studies, pulse oximetry and re-evaluation of patient's condition.   Medications Ordered in ED Medications  feeding supplement (ENSURE ENLIVE) (ENSURE ENLIVE) liquid 237 mL (not administered)  folic acid (FOLVITE) tablet 1 mg (not administered)  multivitamin with minerals tablet 1 tablet (not administered)  LORazepam (ATIVAN) tablet 1 mg (not administered)    Or  LORazepam (ATIVAN) injection 1 mg (not administered)  enoxaparin (LOVENOX) injection 40 mg (40 mg Subcutaneous Not Given 12/22/16 2352)  acetaminophen (TYLENOL) tablet 650 mg (not administered)    Or  acetaminophen (TYLENOL) suppository 650 mg (not administered)  docusate sodium (COLACE) capsule 100 mg (100 mg Oral Not Given 12/22/16 2352)  ondansetron (ZOFRAN) tablet 4 mg (not administered)    Or  ondansetron (ZOFRAN) injection 4 mg (not administered)  sodium chloride flush (NS) 0.9 % injection 3 mL (not administered)  thiamine  in normal saline (50ml)  IVPB (not administered)  thiamine (B-1) injection 250 mg (not administered)  potassium chloride 10 mEq in 100 mL IVPB (10 mEq Intravenous New Bag/Given 12/23/16 0102)  potassium chloride 10 mEq in 100 mL IVPB (not administered)  potassium chloride SA (K-DUR,KLOR-CON) CR tablet 40 mEq (40 mEq Oral Given 12/22/16 1831)  sodium chloride 0.9 % bolus 1,000 mL (0 mLs Intravenous Stopped 12/22/16 2150)  thiamine (B-1) injection 100 mg (100 mg Intravenous Given 12/22/16 2253)  sodium chloride 0.9 % bolus 1,000 mL (1,000 mLs Intravenous New  Bag/Given 12/22/16 2254)  thiamine (B-1) injection 400 mg (400 mg Intravenous Given 12/22/16 2253)  LORazepam (ATIVAN) injection 0.5 mg (0.5 mg Intravenous Given 12/22/16 2349)     Initial Impression / Assessment and Plan / ED Course  I have reviewed the triage vital signs and the nursing notes.  Pertinent labs & imaging results that were available during my care of the patient were reviewed by me and considered in my medical decision making (see chart for details).     Presentation concerning for Wernicke's/Korsakoff given her recent confusion with notable nystagmus and ataxia on exam. Thiamine given in the emergency department.. Screening labs obtained which revealed hypokalemia for which she was repleted. Otherwise labs are nonspecific. CT head negative. Case was discussed with hospitalist who admitted the patient for further workup and management.  Final Clinical Impressions(s) / ED Diagnoses   Final diagnoses:  Wernicke's encephalopathy      Nira Conn, MD 12/23/16 [phone]

## 2016-12-22 NOTE — ED Notes (Signed)
When changing PT with RN....Pills fell out of pt's pocket

## 2016-12-22 NOTE — ED Notes (Signed)
Attempted IV unsuccessfully. Another RN will attempt.

## 2016-12-23 ENCOUNTER — Inpatient Hospital Stay (HOSPITAL_COMMUNITY): Payer: 59

## 2016-12-23 DIAGNOSIS — F10288 Alcohol dependence with other alcohol-induced disorder: Secondary | ICD-10-CM

## 2016-12-23 LAB — BASIC METABOLIC PANEL
Anion gap: 11 (ref 5–15)
BUN: 22 mg/dL — AB (ref 6–20)
CHLORIDE: 102 mmol/L (ref 101–111)
CO2: 26 mmol/L (ref 22–32)
CREATININE: 0.5 mg/dL (ref 0.44–1.00)
Calcium: 8.7 mg/dL — ABNORMAL LOW (ref 8.9–10.3)
GFR calc Af Amer: >60 mL/min (ref >60)
GFR calc non Af Amer: >60 mL/min (ref >60)
Glucose, Bld: 74 mg/dL (ref 65–99)
POTASSIUM: 4.1 mmol/L (ref 3.5–5.1)
Sodium: 139 mmol/L (ref 135–145)

## 2016-12-23 LAB — CBC
HEMATOCRIT: 22.5 % — AB (ref 36.0–46.0)
Hemoglobin: 7.8 g/dL — ABNORMAL LOW (ref 12.0–15.0)
MCH: 32 pg (ref 26.0–34.0)
MCHC: 34.7 g/dL (ref 30.0–36.0)
MCV: 92.2 fL (ref 78.0–100.0)
Platelets: 207 10*3/uL (ref 150–400)
RBC: 2.44 MIL/uL — ABNORMAL LOW (ref 3.87–5.11)
RDW: 16.1 % — AB (ref 11.5–15.5)
WBC: 6.8 10*3/uL (ref 4.0–10.5)

## 2016-12-23 LAB — PROTIME-INR
INR: 1.06
PROTHROMBIN TIME: 13.8 s (ref 11.4–15.2)

## 2016-12-23 LAB — TSH: TSH: 3.326 u[IU]/mL (ref 0.350–4.500)

## 2016-12-23 LAB — HIV ANTIBODY (ROUTINE TESTING W REFLEX): HIV Screen 4th Generation wRfx: NONREACTIVE

## 2016-12-23 MED ORDER — NICOTINE 7 MG/24HR TD PT24
7.0000 mg | MEDICATED_PATCH | Freq: Every day | TRANSDERMAL | Status: DC
  Administered 2016-12-23 – 2016-12-27 (×5): 7 mg via TRANSDERMAL
  Filled 2016-12-23 (×5): qty 1

## 2016-12-23 MED ORDER — DEXTROSE 5 % IV SOLN
1.0000 g | INTRAVENOUS | Status: AC
  Administered 2016-12-23 – 2016-12-27 (×5): 1 g via INTRAVENOUS
  Filled 2016-12-23 (×5): qty 10

## 2016-12-23 MED ORDER — POTASSIUM CHLORIDE 10 MEQ/100ML IV SOLN
10.0000 meq | Freq: Once | INTRAVENOUS | Status: AC
  Administered 2016-12-23: 10 meq via INTRAVENOUS

## 2016-12-23 MED ORDER — GADOBENATE DIMEGLUMINE 529 MG/ML IV SOLN
15.0000 mL | Freq: Once | INTRAVENOUS | Status: AC | PRN
  Administered 2016-12-23: 11 mL via INTRAVENOUS

## 2016-12-23 NOTE — Progress Notes (Signed)
Nutrition Brief Note  Patient identified on the Malnutrition Screening Tool (MST) Report  Wt Readings from Last 15 Encounters:  12/22/16 128 lb (58.1 kg)  06/29/16 127 lb 10.3 oz (57.9 kg)  06/01/16 160 lb (72.6 kg)  04/27/16 160 lb (72.6 kg)    Body mass index is 21.3 kg/m. Patient meets criteria for normal weight based on current BMI. Skin WDL. Pt with hx of alcohol abuse presented with BLE weakness. Pt admitted for Wernicke's encephalopathy.   Current diet order is Regular.Pt consumed 100% of breakfast this AM which was ~900 kcal and 30 grams of protein. Lunch has already been ordered and is ~575 kcal and 22 grams of protein. Labs and medications reviewed.   No nutrition interventions warranted at this time. If nutrition issues arise, please consult RD.      Trenton Gammon, MS, RD, LDN, [facility] Inpatient Clinical Dietitian Pager # [phone] After hours/weekend pager # [phone]

## 2016-12-23 NOTE — Progress Notes (Addendum)
PROGRESS NOTE [facility]   Virginia Hess   ZOX:[id number] DOB: 1965/12/30  DOA: 12/22/2016 PCP: No PCP Per Patient   Brief Narrative:  51 y/o F with hx of alcohol dependence presented to the ED c/o leg weakness. Patient was admitted for suspected Wernicke's encephalopathy. Patient tells me that she came in due to " fall about 2 days ago and she was having leg discomfort" Patient can't recall what happened last night in the ED. Patient history varies, but she reports that she drinks occasionally. Last drink was Saturday per patient. Patient is receiving IV thiamine and MRI was performed confirming Wernicke's diagnosis.   Subjective: Patient seen and examined, patient answer direct questions appropriate but jump from topic to topic. She has no complaint this AM, and want to know when is she getting discharge. Denies chest pain, sob, cough and dizziness. Oriented x 4   Assessment & Plan: Wernicke's encephalopathy/ETOH dependence  Classic triad encephalopathy, oculomotor dysfunction and gait ataxia on presentation.  Case discussed with Neurology - recommended continue current thiamine treatment. Patient meets Henry Russel criteria with classic triad for the diagnosis AMS, Nystagmus and ataxia  MRI showing bilateral thalamus abnormalities Continue thiamine IV 500 mg TID x 2 days then 250 mg IV x 5 day, then PO 100 mg daily.  Continue MVI and Folate  Continue CIWA monitoring Continue to monitor   UTI - UA grossly abnormal  Urine culture pending  Will treat as patient had some confusion, less likely due to UTI but will cover for at least 3 days. Adding rocephin IV Monitor   Hypokalemia 2/2 ETOH abuse  Resolved  Repeat BMP in AM   AST elevated c/w alcohol abuse  Trend if remains elevated will obtain RUQ [place]  Check LFT in AM   Anemia likely chronic due to alcoholism, further drop likely dilutional  No signs of overt bleeding Get anemia panel  FOBT  Transfuse if Hgb less than  7 Check cbc in AM   DVT prophylaxis: Lovenox  Code Status: FULL  Family Communication: None at bedside  Disposition Plan: Will remain in patient for few more day as she need IV thiamine   Consultants:   None   Procedures:   None   Antimicrobials:  None    Objective: Vitals:   12/22/16 2029 12/22/16 2246 12/23/16 0032 12/23/16 0500  BP: (!) 141/93 115/86 (!) 113/91 100/71  Pulse: (!) 125 100 (!) 115 97  Resp: Temp:   98.4 F (36.9 C) 98.5 F (36.9 C)  TempSrc:   Oral Oral  SpO2: 96% 98% 98% 99%  Weight:      Height:        Intake/Output Summary (Last 24 hours) at 12/23/16 1726 Last data filed at 12/23/16 1044  Gross per 24 hour  Intake             1910 ml  Output                0 ml  Net             1910 ml   Filed Weights   12/22/16 1511  Weight: 58.1 kg (128 lb)    Examination: Full physical exam was not perform as patient was leaving for MRI   General exam: NAD  Respiratory system: Clear to auscultation. No wheezes,crackle or rhonchi Cardiovascular system: S1S2 RRR  Gastrointestinal system:Abdomen soft   Central nervous system: Respond to questions appropriated, but thought process very disorganized  Extremities: No pedal edema.  Skin: No rashes Psychiatry: Judgement and insight appear impair. Mood & affect flat.    Data Reviewed: I have personally reviewed following labs and imaging studies  CBC:  Recent Labs Lab 12/22/16 1720 12/22/16 1732 12/23/16 0546  WBC 7.7  --  6.8  NEUTROABS 4.9  --   --   HGB 9.7* 10.5* 7.8*  HCT 27.6* 31.0* 22.5*  MCV 92.3  --  92.2  PLT 274  --  207   Basic Metabolic Panel:  Recent Labs Lab 12/22/16 1720 12/22/16 1732 12/23/16 0546  NA 135 133* 139  K 2.6* 2.6* 4.1  CL 89* 89* 102  CO2 30  --  26  GLUCOSE 102* 102* 74  BUN 27* 24* 22*  CREATININE 0.76 0.60 0.50  CALCIUM 9.9  --  8.7*  MG 1.8  --   --    GFR: Estimated Creatinine Clearance: 74.9 mL/min (by C-G formula based on SCr  of 0.5 mg/dL). Liver Function Tests:  Recent Labs Lab 12/22/16 1720  AST 154*  ALT 52  ALKPHOS 107  BILITOT 1.6*  PROT 7.2  ALBUMIN 3.8   No results for input(s): LIPASE, AMYLASE in the last 168 hours.  Recent Labs Lab 12/22/16 1942  AMMONIA 32   Coagulation Profile:  Recent Labs Lab 12/22/16 2334  INR 1.06   Thyroid Function Tests:  Recent Labs  12/22/16 2334  TSH 3.326    Radiology Studies: Dg Abd 1 View  Result Date: 12/23/2016 CLINICAL DATA:  Confusion, nausea, bilateral leg numbness EXAM: ABDOMEN - 1 VIEW COMPARISON:  None. FINDINGS: Normal small bowel gas pattern. Some colonic gas noted in splenic flexure of the colon. Some colonic stool and gas noted in distal sigmoid colon. IMPRESSION: Negative. Electronically Signed   By: Natasha Mead M.D.   On: 12/23/2016 09:47   Ct Head Wo Contrast  Result Date: 12/22/2016 CLINICAL DATA:  Numbness and weakness in both legs for over 2 weeks. Alcohol abuse. EXAM: CT HEAD WITHOUT CONTRAST TECHNIQUE: Contiguous axial images were obtained from the base of the skull through the vertex without intravenous contrast. COMPARISON:  CT head without contrast 10/26/2007 FINDINGS: Brain: Mild generalized atrophy is present. Atrophy of the superior vermis is noted. No acute infarct, hemorrhage, or mass lesion is present. The ventricles are of normal size. No significant extraaxial fluid collection is present. Vascular: No hyperdense vessel or unexpected calcification. Skull: No focal lytic or blastic lesions are present. Calvarium is intact. Sinuses/Orbits: The paranasal sinuses and mastoid air cells are clear. The visualized orbits are normal. IMPRESSION: 1. No acute intracranial abnormality. 2. Mild generalized atrophy and superior vermian atrophy may be related to alcohol use. Electronically Signed   By: Marin Roberts M.D.   On: 12/22/2016 19:39   Mr Laqueta Jean WG Contrast  Result Date: 12/23/2016 CLINICAL DATA:  Next diagnosis and  altered mental status. EXAM: MRI HEAD WITHOUT AND WITH CONTRAST TECHNIQUE: Multiplanar, multiecho pulse sequences of the brain and surrounding structures were obtained without and with intravenous contrast. CONTRAST:  11mL MULTIHANCE GADOBENATE DIMEGLUMINE 529 MG/ML IV SOLN COMPARISON:  Head CT from yesterday FINDINGS: Brain: There is a band of T2 hyperintensity along the bilateral medial thalamus. These areas show weak restricted diffusion. There could be mild periaqueductal gray T2 hyperintensity, but limited by the degree of motion. No definite signal abnormality in the tectum or fourth ventricular floor. No detected abnormal enhancement, although again limited by motion. The mamillary bodies in particular do not  show evidence of enhancement. Given the clinical suspicion, findings are compatible with Wernicke's encephalopathy. Bilateral medial thalamus infarct is a differential consideration, but considered much less likely given clinical circumstances. No hemorrhage, hydrocephalus, or mass. Mild periventricular FLAIR hyperintensity. Vascular: Major flow voids are preserved. Skull and upper cervical spine: Negative Sinuses/Orbits: Negative IMPRESSION: 1. Bilateral medial thalamus signal abnormality as seen with the suspected Wernicke's encephalopathy. 2. Intermittently motion degraded exam causing some nondiagnostic sequences/images. Electronically Signed   By: Marnee Spring M.D.   On: 12/23/2016 12:12   Dg Chest Port 1 View  Result Date: 12/23/2016 CLINICAL DATA:  Confusion, numbness and weakness in the legs. Nausea. EXAM: PORTABLE CHEST 1 VIEW COMPARISON:  07/03/2016. FINDINGS: The heart size and mediastinal contours are within normal limits. Both lungs are clear. The visualized skeletal structures are unremarkable. IMPRESSION: No active disease.  Stable exam.  No change from priors. Electronically Signed   By: Elsie Stain M.D.   On: 12/23/2016 10:04    Scheduled Meds: . docusate sodium  100 mg Oral  BID  . enoxaparin (LOVENOX) injection  40 mg Subcutaneous QHS  . feeding supplement (ENSURE ENLIVE)  237 mL Oral BID BM  . folic acid  1 mg Oral Daily  . multivitamin with minerals  1 tablet Oral Daily  . sodium chloride flush  3 mL Intravenous Q12H  . [START ON 12/24/2016] thiamine injection  250 mg Intravenous Daily   Continuous Infusions: . thiamine injection 0 mg (12/23/16 1247)     LOS: 1 day   Latrelle Dodrill, MD Pager: Text Page via [email or web address]  [phone]  If 7PM-7AM, please contact night-coverage [email or web address] Password [facility] 12/23/2016, 5:26 PM

## 2016-12-23 NOTE — Progress Notes (Addendum)
CSW consulted to assist with SA resources. Pt currently receiving nsg assistance. CSW will return later this am to meet with pt .  Cori Razor LCSW [phone]

## 2016-12-24 DIAGNOSIS — R7989 Other specified abnormal findings of blood chemistry: Secondary | ICD-10-CM

## 2016-12-24 DIAGNOSIS — N390 Urinary tract infection, site not specified: Secondary | ICD-10-CM

## 2016-12-24 DIAGNOSIS — D638 Anemia in other chronic diseases classified elsewhere: Secondary | ICD-10-CM

## 2016-12-24 LAB — IRON AND TIBC
IRON: 141 ug/dL (ref 28–170)
SATURATION RATIOS: 80 % — AB (ref 10.4–31.8)
TIBC: 176 ug/dL — AB (ref 250–450)
UIBC: 35 ug/dL

## 2016-12-24 LAB — CBC WITH DIFFERENTIAL/PLATELET
BASOS ABS: 0.1 10*3/uL (ref 0.0–0.1)
BASOS PCT: 1 %
EOS PCT: 1 %
Eosinophils Absolute: 0.1 10*3/uL (ref 0.0–0.7)
HCT: 24.4 % — ABNORMAL LOW (ref 36.0–46.0)
Hemoglobin: 8.4 g/dL — ABNORMAL LOW (ref 12.0–15.0)
Lymphocytes Relative: 36 %
Lymphs Abs: 2.6 10*3/uL (ref 0.7–4.0)
MCH: 32.1 pg (ref 26.0–34.0)
MCHC: 34.4 g/dL (ref 30.0–36.0)
MCV: 93.1 fL (ref 78.0–100.0)
MONO ABS: 0.6 10*3/uL (ref 0.1–1.0)
Monocytes Relative: 9 %
Neutro Abs: 3.9 10*3/uL (ref 1.7–7.7)
Neutrophils Relative %: 53 %
Platelets: 219 10*3/uL (ref 150–400)
RBC: 2.62 MIL/uL — AB (ref 3.87–5.11)
RDW: 16.4 % — AB (ref 11.5–15.5)
WBC: 7.2 10*3/uL (ref 4.0–10.5)

## 2016-12-24 LAB — RETICULOCYTES
RBC.: 2.62 MIL/uL — ABNORMAL LOW (ref 3.87–5.11)
RETIC CT PCT: 1.3 % (ref 0.4–3.1)
Retic Count, Absolute: 34.1 10*3/uL (ref 19.0–186.0)

## 2016-12-24 LAB — COMPREHENSIVE METABOLIC PANEL
ALBUMIN: 2.7 g/dL — AB (ref 3.5–5.0)
ALK PHOS: 97 U/L (ref 38–126)
ALT: 55 U/L — AB (ref 14–54)
ANION GAP: 8 (ref 5–15)
AST: 155 U/L — AB (ref 15–41)
BUN: 19 mg/dL (ref 6–20)
CALCIUM: 9 mg/dL (ref 8.9–10.3)
CO2: 25 mmol/L (ref 22–32)
Chloride: 104 mmol/L (ref 101–111)
Creatinine, Ser: 0.59 mg/dL (ref 0.44–1.00)
GFR calc Af Amer: >60 mL/min (ref >60)
GFR calc non Af Amer: >60 mL/min (ref >60)
GLUCOSE: 91 mg/dL (ref 65–99)
Potassium: 3.7 mmol/L (ref 3.5–5.1)
SODIUM: 137 mmol/L (ref 135–145)
Total Bilirubin: 0.8 mg/dL (ref 0.3–1.2)
Total Protein: 5.2 g/dL — ABNORMAL LOW (ref 6.5–8.1)

## 2016-12-24 LAB — MAGNESIUM: MAGNESIUM: 1.2 mg/dL — AB (ref 1.7–2.4)

## 2016-12-24 LAB — PHOSPHORUS: PHOSPHORUS: 1.2 mg/dL — AB (ref 2.5–4.6)

## 2016-12-24 LAB — FERRITIN: Ferritin: 1743 ng/mL — ABNORMAL HIGH (ref 11–307)

## 2016-12-24 LAB — VITAMIN B12: VITAMIN B 12: 691 pg/mL (ref 180–914)

## 2016-12-24 LAB — FOLATE: Folate: 11.2 ng/mL (ref >5.9)

## 2016-12-24 MED ORDER — POTASSIUM PHOSPHATES 15 MMOLE/5ML IV SOLN
20.0000 mmol | Freq: Once | INTRAVENOUS | Status: AC
  Administered 2016-12-24: 20 mmol via INTRAVENOUS
  Filled 2016-12-24: qty 6.67

## 2016-12-24 MED ORDER — MAGNESIUM SULFATE 2 GM/50ML IV SOLN
2.0000 g | Freq: Once | INTRAVENOUS | Status: AC
  Administered 2016-12-24: 2 g via INTRAVENOUS
  Filled 2016-12-24: qty 50

## 2016-12-24 NOTE — Progress Notes (Signed)
Assumed care of patient from previous RN.  Agree with previous RN's assessment of patient.  Will continue to monitor. 

## 2016-12-24 NOTE — Progress Notes (Signed)
CSW following for discharge needs. Patient unable to participate in CSW assessment at this time. Will attempt at later time.   Vivi Barrack, Theresia Majors, MSW Clinical Social Worker 5E and Psychiatric Service Line [phone] 12/24/2016  2:01 PM

## 2016-12-24 NOTE — Evaluation (Signed)
Physical Therapy Evaluation Patient Details Name: Virginia Hess MRN: [id number] DOB: 10-24-1965 Today's Date: 12/24/2016   History of Present Illness  51 y/o F with hx of alcohol dependence (recovery for 7 years, relapsed in May 2017 per chart) presented to the ED c/o leg weakness. Patient was admitted for suspected Wernicke's encephalopathy  Clinical Impression  Pt admitted with above diagnosis. Pt currently with functional limitations due to the deficits listed below (see PT Problem List).  Pt will benefit from skilled PT to increase their independence and safety with mobility to allow discharge to the venue listed below.  Pt assisted OOB to recliner and presents with strong posterior lean.  Pt may need SNF if she does not progress.  Pt lives alone and states she doesn't like people visiting her.     Follow Up Recommendations Supervision/Assistance - 24 hour;SNF    Equipment Recommendations  Rolling walker with 5" wheels    Recommendations for Other Services       Precautions / Restrictions Precautions Precautions: Fall      Mobility  Bed Mobility Overal bed mobility: Needs Assistance Bed Mobility: Supine to Sit     Supine to sit: Supervision;HOB elevated        Transfers Overall transfer level: Needs assistance Equipment used: Rolling walker (2 wheeled) Transfers: Sit to/from [employer] Sit to Stand: Min assist Stand pivot transfers: Min assist       General transfer comment: verbal cues for safe technique, posterior lean against bed surface, posterior lean continued with transfer to recliner  Ambulation/Gait                Stairs            Wheelchair Mobility    Modified Rankin (Stroke Patients Only)       Balance Overall balance assessment: Needs assistance;History of Falls       Postural control: Posterior lean Standing balance support: Bilateral upper extremity supported;During functional activity Standing  balance-Leahy Scale: Zero Standing balance comment: requires external support                              Pertinent Vitals/Pain Pain Assessment: No/denies pain    Home Living Family/patient expects to be discharged to:: Private residence Living Arrangements: Alone   Type of Home: House Home Access: Stairs to enter Entrance Stairs-Rails: Can reach both Entrance Stairs-Number of Steps: 2 Home Layout: One level Home Equipment: None Additional Comments: per previous admission    Prior Function Level of Independence: Independent               Hand Dominance        Extremity/Trunk Assessment        Lower Extremity Assessment Lower Extremity Assessment: Generalized weakness (poor LE coordination observed)    Cervical / Trunk Assessment Cervical / Trunk Assessment: Normal  Communication   Communication: No difficulties  Cognition Arousal/Alertness: Awake/alert Behavior During Therapy: WFL for tasks assessed/performed Overall Cognitive Status: No family/caregiver present to determine baseline cognitive functioning                                 General Comments: answers questions however not always appropriately      General Comments      Exercises     Assessment/Plan    PT Assessment Patient needs continued PT services  PT Problem List Decreased  mobility;Decreased strength;Decreased balance;Decreased knowledge of use of DME;Decreased activity tolerance;Decreased coordination;Decreased safety awareness       PT Treatment Interventions Gait training;DME instruction;Therapeutic activities;Therapeutic exercise;Patient/family education;Functional mobility training    PT Goals (Current goals can be found in the Care Plan section)  Acute Rehab PT Goals PT Goal Formulation: With patient Time For Goal Achievement: 12/31/16 Potential to Achieve Goals: Good    Frequency Min 3X/week   Barriers to discharge        Co-evaluation                End of Session Equipment Utilized During Treatment: Gait belt Activity Tolerance: Patient tolerated treatment well Patient left: in chair;with call bell/phone within reach;with chair alarm set;with nursing/sitter in room Nurse Communication: Mobility status PT Visit Diagnosis: Difficulty in walking, not elsewhere classified (R26.2);Unsteadiness on feet (R26.81)    Time: 4098-1191 PT Time Calculation (min) (ACUTE ONLY): 12 min   Charges:   PT Evaluation $PT Eval Low Complexity: 1 Procedure     PT G Codes:       Zenovia Jarred, PT, DPT 12/24/2016 Pager: [phone]   Maida Sale E 12/24/2016, 12:51 PM

## 2016-12-24 NOTE — Progress Notes (Signed)
PROGRESS NOTE [facility]   Virginia Hess   ZOX:[id number] DOB: 23-Dec-1965  DOA: 12/22/2016 PCP: No PCP Per Patient   Brief Narrative:  51 y/o F with hx of alcohol dependence presented to the ED c/o leg weakness. Patient was admitted for suspected Wernicke's encephalopathy. Patient tells me that she came in due to " fall about 2 days ago and she was having leg discomfort" Patient can't recall what happened last night in the ED. Patient history varies, but she reports that she drinks occasionally. Last drink was Saturday per patient. Patient is receiving IV thiamine and MRI was performed confirming Wernicke's diagnosis.   Subjective: Patient seen and examined, continues to be confuse.  Was very hard to wake up in the morning, but then responded well. She thinks that she is on a "boat" No complaints.   Assessment & Plan: Wernicke's encephalopathy/ETOH dependence  Classic triad encephalopathy, oculomotor dysfunction and gait ataxia on presentation.  Case discussed with Neurology - recommended continue current thiamine treatment. Patient meets Henry Russel criteria with classic triad for the diagnosis AMS, Nystagmus and ataxia  MRI showing bilateral thalamus abnormalities Continue thiamine IV 500 mg TID x 2 days then 250 mg IV x 5 day, then PO 100 mg daily.  Continue MVI and Folate  Continue CIWA monitoring Continue to monitor   UTI - UA grossly abnormal  Urine culture pending  Will treat as patient had some confusion, less likely due to UTI but will cover for at least 3 days. Continue rocephin for now  Monitor   Hypokalemia 2/2 ETOH abuse  Resolved  Repeat BMP in AM   LFT elevated c/w alcohol abuse  Trend if going upward will obtain RUQ [place]  Continue to monitor   Anemia likely chronic due to alcoholism, further drop likely dilutional, improved  No signs of overt bleeding Anemia panel normal, B12 normal  FOBT - pending  Transfuse if Hgb less than 7 Check cbc in AM   DVT  prophylaxis: Lovenox  Code Status: FULL  Family Communication: None at bedside  Disposition Plan: Will remain in patient for few more day as she need IV thiamine   Consultants:   None   Procedures:   None   Antimicrobials:  None    Objective: Vitals:   12/23/16 0032 12/23/16 0500 12/23/16 2018 12/24/16 0338  BP: (!) 113/91 100/71 102/74 107/69  Pulse: (!) 115 97 97 (!) 102  Resp: Temp: 98.4 F (36.9 C) 98.5 F (36.9 C) 98.3 F (36.8 C) 98 F (36.7 C)  TempSrc: Oral Oral Oral Oral  SpO2: 98% 99% 100% 100%  Weight:      Height:        Intake/Output Summary (Last 24 hours) at 12/24/16 1528 Last data filed at 12/24/16 0600  Gross per 24 hour  Intake              100 ml  Output                0 ml  Net              100 ml   Filed Weights   12/22/16 1511  Weight: 58.1 kg (128 lb)    Examination:   General exam: Sleeping EENT: + horizontal nystagmus.  Respiratory system: CTA Cardiovascular system: S1S2 regular  Gastrointestinal system: Abd soft Central nervous system: Continues to be confuse, gait ataxia and romberg positive   Extremities: No LE edema   Skin: No  lesions  Psychiatry: judgement impair, mood flat    Data Reviewed: I have personally reviewed following labs and imaging studies  CBC:  Recent Labs Lab 12/22/16 1720 12/22/16 1732 12/23/16 0546 12/24/16 0548  WBC 7.7  --  6.8 7.2  NEUTROABS 4.9  --   --  3.9  HGB 9.7* 10.5* 7.8* 8.4*  HCT 27.6* 31.0* 22.5* 24.4*  MCV 92.3  --  92.2 93.1  PLT 274  --  207 219   Basic Metabolic Panel:  Recent Labs Lab 12/22/16 1720 12/22/16 1732 12/23/16 0546 12/24/16 0548  NA 135 133* 139 137  K 2.6* 2.6* 4.1 3.7  CL 89* 89* 102 104  CO2 30  --  26 25  GLUCOSE 102* 102* 74 91  BUN 27* 24* 22* 19  CREATININE 0.76 0.60 0.50 0.59  CALCIUM 9.9  --  8.7* 9.0  MG 1.8  --   --  1.2*  PHOS  --   --   --  1.2*   GFR: Estimated Creatinine Clearance: 74.9 mL/min (by C-G formula based  on SCr of 0.59 mg/dL). Liver Function Tests:  Recent Labs Lab 12/22/16 1720 12/24/16 0548  AST 154* 155*  ALT 52 55*  ALKPHOS 107 97  BILITOT 1.6* 0.8  PROT 7.2 5.2*  ALBUMIN 3.8 2.7*   No results for input(s): LIPASE, AMYLASE in the last 168 hours.  Recent Labs Lab 12/22/16 1942  AMMONIA 32   Coagulation Profile:  Recent Labs Lab 12/22/16 2334  INR 1.06   Thyroid Function Tests:  Recent Labs  12/22/16 2334  TSH 3.326    Radiology Studies: Dg Abd 1 View  Result Date: 12/23/2016 CLINICAL DATA:  Confusion, nausea, bilateral leg numbness EXAM: ABDOMEN - 1 VIEW COMPARISON:  None. FINDINGS: Normal small bowel gas pattern. Some colonic gas noted in splenic flexure of the colon. Some colonic stool and gas noted in distal sigmoid colon. IMPRESSION: Negative. Electronically Signed   By: Natasha Mead M.D.   On: 12/23/2016 09:47   Ct Head Wo Contrast  Result Date: 12/22/2016 CLINICAL DATA:  Numbness and weakness in both legs for over 2 weeks. Alcohol abuse. EXAM: CT HEAD WITHOUT CONTRAST TECHNIQUE: Contiguous axial images were obtained from the base of the skull through the vertex without intravenous contrast. COMPARISON:  CT head without contrast 10/26/2007 FINDINGS: Brain: Mild generalized atrophy is present. Atrophy of the superior vermis is noted. No acute infarct, hemorrhage, or mass lesion is present. The ventricles are of normal size. No significant extraaxial fluid collection is present. Vascular: No hyperdense vessel or unexpected calcification. Skull: No focal lytic or blastic lesions are present. Calvarium is intact. Sinuses/Orbits: The paranasal sinuses and mastoid air cells are clear. The visualized orbits are normal. IMPRESSION: 1. No acute intracranial abnormality. 2. Mild generalized atrophy and superior vermian atrophy may be related to alcohol use. Electronically Signed   By: Marin Roberts M.D.   On: 12/22/2016 19:39   Mr Laqueta Jean ZO Contrast  Result Date:  12/23/2016 CLINICAL DATA:  Next diagnosis and altered mental status. EXAM: MRI HEAD WITHOUT AND WITH CONTRAST TECHNIQUE: Multiplanar, multiecho pulse sequences of the brain and surrounding structures were obtained without and with intravenous contrast. CONTRAST:  11mL MULTIHANCE GADOBENATE DIMEGLUMINE 529 MG/ML IV SOLN COMPARISON:  Head CT from yesterday FINDINGS: Brain: There is a band of T2 hyperintensity along the bilateral medial thalamus. These areas show weak restricted diffusion. There could be mild periaqueductal gray T2 hyperintensity, but limited by the degree of  motion. No definite signal abnormality in the tectum or fourth ventricular floor. No detected abnormal enhancement, although again limited by motion. The mamillary bodies in particular do not show evidence of enhancement. Given the clinical suspicion, findings are compatible with Wernicke's encephalopathy. Bilateral medial thalamus infarct is a differential consideration, but considered much less likely given clinical circumstances. No hemorrhage, hydrocephalus, or mass. Mild periventricular FLAIR hyperintensity. Vascular: Major flow voids are preserved. Skull and upper cervical spine: Negative Sinuses/Orbits: Negative IMPRESSION: 1. Bilateral medial thalamus signal abnormality as seen with the suspected Wernicke's encephalopathy. 2. Intermittently motion degraded exam causing some nondiagnostic sequences/images. Electronically Signed   By: Marnee Spring M.D.   On: 12/23/2016 12:12   Dg Chest Port 1 View  Result Date: 12/23/2016 CLINICAL DATA:  Confusion, numbness and weakness in the legs. Nausea. EXAM: PORTABLE CHEST 1 VIEW COMPARISON:  07/03/2016. FINDINGS: The heart size and mediastinal contours are within normal limits. Both lungs are clear. The visualized skeletal structures are unremarkable. IMPRESSION: No active disease.  Stable exam.  No change from priors. Electronically Signed   By: Elsie Stain M.D.   On: 12/23/2016 10:04     Scheduled Meds: . docusate sodium  100 mg Oral BID  . enoxaparin (LOVENOX) injection  40 mg Subcutaneous QHS  . feeding supplement (ENSURE ENLIVE)  237 mL Oral BID BM  . folic acid  1 mg Oral Daily  . multivitamin with minerals  1 tablet Oral Daily  . nicotine  7 mg Transdermal Daily  . sodium chloride flush  3 mL Intravenous Q12H  . thiamine injection  250 mg Intravenous Daily   Continuous Infusions: . cefTRIAXone (ROCEPHIN)  IV Stopped (12/23/16 2230)  . potassium phosphate IVPB (mmol)    . thiamine injection Stopped (12/24/16 1127)     LOS: 2 days   Latrelle Dodrill, MD Pager: Text Page via [email or web address]  [phone]  If 7PM-7AM, please contact night-coverage [email or web address] Password [facility] 12/24/2016, 3:28 PM

## 2016-12-25 LAB — COMPREHENSIVE METABOLIC PANEL
ALT: 52 U/L (ref 14–54)
AST: 116 U/L — AB (ref 15–41)
Albumin: 2.8 g/dL — ABNORMAL LOW (ref 3.5–5.0)
Alkaline Phosphatase: 100 U/L (ref 38–126)
Anion gap: 8 (ref 5–15)
BILIRUBIN TOTAL: 1.2 mg/dL (ref 0.3–1.2)
BUN: 14 mg/dL (ref 6–20)
CHLORIDE: 100 mmol/L — AB (ref 101–111)
CO2: 28 mmol/L (ref 22–32)
CREATININE: 0.66 mg/dL (ref 0.44–1.00)
Calcium: 8.9 mg/dL (ref 8.9–10.3)
GFR calc Af Amer: >60 mL/min (ref >60)
GLUCOSE: 86 mg/dL (ref 65–99)
Potassium: 4.9 mmol/L (ref 3.5–5.1)
Sodium: 136 mmol/L (ref 135–145)
Total Protein: 5.7 g/dL — ABNORMAL LOW (ref 6.5–8.1)

## 2016-12-25 LAB — MAGNESIUM: Magnesium: 1.6 mg/dL — ABNORMAL LOW (ref 1.7–2.4)

## 2016-12-25 LAB — CBC WITH DIFFERENTIAL/PLATELET
BASOS PCT: 1 %
Basophils Absolute: 0.1 10*3/uL (ref 0.0–0.1)
EOS PCT: 1 %
Eosinophils Absolute: 0.1 10*3/uL (ref 0.0–0.7)
HEMATOCRIT: 25.1 % — AB (ref 36.0–46.0)
HEMOGLOBIN: 8.8 g/dL — AB (ref 12.0–15.0)
LYMPHS PCT: 37 %
Lymphs Abs: 2.7 10*3/uL (ref 0.7–4.0)
MCH: 32.5 pg (ref 26.0–34.0)
MCHC: 35.1 g/dL (ref 30.0–36.0)
MCV: 92.6 fL (ref 78.0–100.0)
MONOS PCT: 9 %
Monocytes Absolute: 0.7 10*3/uL (ref 0.1–1.0)
NEUTROS ABS: 3.7 10*3/uL (ref 1.7–7.7)
Neutrophils Relative %: 52 %
Platelets: 290 10*3/uL (ref 150–400)
RBC: 2.71 MIL/uL — ABNORMAL LOW (ref 3.87–5.11)
RDW: 17.2 % — ABNORMAL HIGH (ref 11.5–15.5)
WBC: 7.3 10*3/uL (ref 4.0–10.5)

## 2016-12-25 LAB — PHOSPHORUS: Phosphorus: 3.3 mg/dL (ref 2.5–4.6)

## 2016-12-25 MED ORDER — MAGNESIUM SULFATE 2 GM/50ML IV SOLN
2.0000 g | Freq: Once | INTRAVENOUS | Status: AC
  Administered 2016-12-25: 2 g via INTRAVENOUS
  Filled 2016-12-25: qty 50

## 2016-12-25 NOTE — Progress Notes (Signed)
PROGRESS NOTE [facility]   KYMBERLY BLOMBERG   GUY:[id number] DOB: 1966/06/26  DOA: 12/22/2016 PCP: No PCP Per Patient   Brief Narrative:  51 y/o F with hx of alcohol dependence presented to the ED c/o leg weakness. Patient was admitted for suspected Wernicke's encephalopathy. Patient tells me that she came in due to " fall about 2 days ago and she was having leg discomfort" Patient can't recall what happened last night in the ED. Patient history varies, but she reports that she drinks occasionally. Last drink was Saturday per patient. Patient is receiving IV thiamine and MRI was performed confirming Wernicke's diagnosis.   Subjective: Patient seen and examined, feeling better this AM. Patient more oriented today. No acute events overnight.   Assessment & Plan: Wernicke's encephalopathy/ETOH dependence  On admission classic triad encephalopathy, oculomotor dysfunction and gait ataxia on presentation.  Case discussed with Neurology - recommended continue current thiamine treatment. Patient meets Henry Russel criteria with classic triad for the diagnosis AMS, Nystagmus and ataxia  MRI showing bilateral thalamus abnormalities Already received thiamine IV 500 mg TID x 2 days, now on 250 mg IV x 5 day, then PO 100 mg daily.  Continue MVI and Folate  Continue CIWA monitoring Continue to monitor   UTI - UA grossly abnormal  Will treat as patient had some confusion, less likely due to UTI but will cover for at least 3 days. On rocephin day 3, urine culture was not send by lab, will keep rocephin for 2 more days as patient is clinically improving   Hypokalemia 2/2 ETOH abuse  Resolved  Repeat BMP in AM   LFT elevated c/w alcohol abuse  Trending down   Anemia likely chronic due to alcoholism, further drop likely dilutional, improved  No signs of overt bleeding Anemia panel normal, B12 normal  Transfuse if Hgb less than 7 Check cbc in AM   DVT prophylaxis: Lovenox  Code Status: FULL    Family Communication: None at bedside  Disposition Plan: Will remain in patient for few more day as she need IV thiamine   Consultants:   None   Procedures:   None   Antimicrobials:  None    Objective: Vitals:   12/24/16 1520 12/25/16 0002 12/25/16 0500 12/25/16 1420  BP: 108/71 98/66 105/73 108/75  Pulse: 100 98 92 95  Resp: Temp: 98.4 F (36.9 C) 98.5 F (36.9 C) 98 F (36.7 C) 98.1 F (36.7 C)  TempSrc: Oral Oral Oral Oral  SpO2: 100% 100% 100% 100%  Weight:      Height:        Intake/Output Summary (Last 24 hours) at 12/25/16 1650 Last data filed at 12/25/16 1300  Gross per 24 hour  Intake               51 ml  Output              350 ml  Net             -299 ml   Filed Weights   12/22/16 1511  Weight: 58.1 kg (128 lb)    Examination:   General exam: NAD EENT: + horizontal nystagmus, but improving  Respiratory system: Good air entry, no wheezing  Cardiovascular system: S1S2 no murmurs Gastrointestinal system: Abd soft  Central nervous system: Non focal,   Extremities: No LE edema   Skin: No rash  Psychiatry: Judgement impair, Mood flat    Data Reviewed: I have personally reviewed  following labs and imaging studies  CBC:  Recent Labs Lab 12/22/16 1720 12/22/16 1732 12/23/16 0546 12/24/16 0548 12/25/16 0554  WBC 7.7  --  6.8 7.2 7.3  NEUTROABS 4.9  --   --  3.9 3.7  HGB 9.7* 10.5* 7.8* 8.4* 8.8*  HCT 27.6* 31.0* 22.5* 24.4* 25.1*  MCV 92.3  --  92.2 93.1 92.6  PLT 274  --  207 219 290   Basic Metabolic Panel:  Recent Labs Lab 12/22/16 1720 12/22/16 1732 12/23/16 0546 12/24/16 0548 12/25/16 0554  NA 135 133* 139 137 136  K 2.6* 2.6* 4.1 3.7 4.9  CL 89* 89* 102 104 100*  CO2 30  --  GLUCOSE 102* 102* 74 91 86  BUN 27* 24* 22* 19 14  CREATININE 0.76 0.60 0.50 0.59 0.66  CALCIUM 9.9  --  8.7* 9.0 8.9  MG 1.8  --   --  1.2* 1.6*  PHOS  --   --   --  1.2* 3.3   GFR: Estimated Creatinine Clearance:  74.9 mL/min (by C-G formula based on SCr of 0.66 mg/dL). Liver Function Tests:  Recent Labs Lab 12/22/16 1720 12/24/16 0548 12/25/16 0554  AST 154* 155* 116*  ALT 52 55* 52  ALKPHOS 107 97 100  BILITOT 1.6* 0.8 1.2  PROT 7.2 5.2* 5.7*  ALBUMIN 3.8 2.7* 2.8*   No results for input(s): LIPASE, AMYLASE in the last 168 hours.  Recent Labs Lab 12/22/16 1942  AMMONIA 32   Coagulation Profile:  Recent Labs Lab 12/22/16 2334  INR 1.06   Thyroid Function Tests:  Recent Labs  12/22/16 2334  TSH 3.326    Radiology Studies: No results found.  Scheduled Meds: . docusate sodium  100 mg Oral BID  . enoxaparin (LOVENOX) injection  40 mg Subcutaneous QHS  . feeding supplement (ENSURE ENLIVE)  237 mL Oral BID BM  . folic acid  1 mg Oral Daily  . multivitamin with minerals  1 tablet Oral Daily  . nicotine  7 mg Transdermal Daily  . sodium chloride flush  3 mL Intravenous Q12H  . thiamine injection  250 mg Intravenous Daily   Continuous Infusions: . cefTRIAXone (ROCEPHIN)  IV Stopped (12/24/16 2203)     LOS: 3 days   Latrelle Dodrill, MD Pager: Text Page via [email or web address]  [phone]  If 7PM-7AM, please contact night-coverage [email or web address] Password [facility] 12/25/2016, 4:50 PM

## 2016-12-26 LAB — CBC
HCT: 21.4 % — ABNORMAL LOW (ref 36.0–46.0)
Hemoglobin: 7.6 g/dL — ABNORMAL LOW (ref 12.0–15.0)
MCH: 32.9 pg (ref 26.0–34.0)
MCHC: 35.5 g/dL (ref 30.0–36.0)
MCV: 92.6 fL (ref 78.0–100.0)
PLATELETS: 228 10*3/uL (ref 150–400)
RBC: 2.31 MIL/uL — ABNORMAL LOW (ref 3.87–5.11)
RDW: 17.5 % — ABNORMAL HIGH (ref 11.5–15.5)
WBC: 6.1 10*3/uL (ref 4.0–10.5)

## 2016-12-26 NOTE — NC FL2 (Signed)
[place] MEDICAID FL2 LEVEL OF CARE SCREENING TOOL     IDENTIFICATION  Patient Name: Virginia Hess Birthdate: 1966/01/07 Sex: female Admission Date (Current Location): 12/22/2016  [facility] and [place] Number:  Producer, television/film/video and Address:  [facility],  [address] [place], [place] [address]      Provider Number: [id number]  Attending Physician Name and Address:  Lenox Ponds, MD  Relative Name and Phone Number:       Current Level of Care: Hospital Recommended Level of Care: Skilled Nursing Facility Prior Approval Number:    Date Approved/Denied:   PASRR Number: [id number]  Discharge Plan: SNF    Current Diagnoses: Patient Active Problem List   Diagnosis Date Noted  . Wernicke's encephalopathy 12/22/2016  . Anemia of chronic disease 12/22/2016  . Alcohol dependence (HCC) 06/29/2016  . Hypokalemia 06/29/2016  . Hypomagnesemia 06/29/2016  . Lactic acid acidosis 06/29/2016  . Elevated LFTs 06/29/2016  . UTI (urinary tract infection) 06/29/2016  . Depressed 06/29/2016    Orientation RESPIRATION BLADDER Height & Weight     Self, Place, Situation  Normal Continent Weight: 128 lb (58.1 kg) Height:   (165.1 cm)  BEHAVIORAL SYMPTOMS/MOOD NEUROLOGICAL BOWEL NUTRITION STATUS      Continent Diet (see dc summary)  AMBULATORY STATUS COMMUNICATION OF NEEDS Skin   Limited Assist Verbally Normal                       Personal Care Assistance Level of Assistance  Bathing, Dressing Bathing Assistance: Limited assistance   Dressing Assistance: Limited assistance     Functional Limitations Info             SPECIAL CARE FACTORS FREQUENCY  PT (By licensed PT), OT (By licensed OT)     PT Frequency: 5/wk OT Frequency: 5/wk            Contractures      Additional Factors Info  Code Status, Allergies Code Status Info: FULL Allergies Info: NKA           Current Medications (12/26/2016):  This is the current hospital  active medication list Current Facility-Administered Medications  Medication Dose Route Frequency Provider Last Rate Last Dose  . acetaminophen (TYLENOL) tablet 650 mg  650 mg Oral Q6H PRN Jonah Blue, MD       Or  . acetaminophen (TYLENOL) suppository 650 mg  650 mg Rectal Q6H PRN Jonah Blue, MD      . cefTRIAXone (ROCEPHIN) 1 g in dextrose 5 % 50 mL IVPB  1 g Intravenous Q24H Lenox Ponds, MD   Stopped at 12/25/16 2106  . docusate sodium (COLACE) capsule 100 mg  100 mg Oral BID Jonah Blue, MD   100 mg at 12/26/16 1001  . enoxaparin (LOVENOX) injection 40 mg  40 mg Subcutaneous QHS Jonah Blue, MD   40 mg at 12/25/16 2036  . feeding supplement (ENSURE ENLIVE) (ENSURE ENLIVE) liquid 237 mL  237 mL Oral BID BM Jonah Blue, MD   237 mL at 12/25/16 1046  . folic acid (FOLVITE) tablet 1 mg  1 mg Oral Daily Jonah Blue, MD   1 mg at 12/26/16 1001  . multivitamin with minerals tablet 1 tablet  1 tablet Oral Daily Jonah Blue, MD   1 tablet at 12/26/16 1001  . nicotine (NICODERM CQ - dosed in mg/24 hr) patch 7 mg  7 mg Transdermal Daily Leda Gauze, NP   7 mg at  12/25/16 2000  . ondansetron (ZOFRAN) tablet 4 mg  4 mg Oral Q6H PRN Jonah Blue, MD       Or  . ondansetron [facility]) injection 4 mg  4 mg Intravenous Q6H PRN Jonah Blue, MD      . sodium chloride flush (NS) 0.9 % injection 3 mL  3 mL Intravenous Q12H Jonah Blue, MD   3 mL at 12/26/16 1004  . thiamine (B-1) injection 250 mg  250 mg Intravenous Daily Jonah Blue, MD   250 mg at 12/26/16 1004     Discharge Medications: Please see discharge summary for a list of discharge medications.  Relevant Imaging Results:  Relevant Lab Results:   Additional Information SS#: [id number]  Burna Sis, LCSW

## 2016-12-26 NOTE — Progress Notes (Signed)
PROGRESS NOTE [facility]   Virginia Hess   ZOX:[id number] DOB: September 05, 1966  DOA: 12/22/2016 PCP: No PCP Per Patient   Brief Narrative:  51 y/o F with hx of alcohol dependence presented to the ED c/o leg weakness. Patient was admitted for suspected Wernicke's encephalopathy. Patient tells me that she came in due to " fall about 2 days ago and she was having leg discomfort" Patient can't recall what happened last night in the ED. Patient history varies, but she reports that she drinks occasionally. Last drink was Saturday per patient. Patient is receiving IV thiamine and MRI was performed confirming Wernicke's diagnosis.   Subjective: Patient seen and examined, she is fully oriented. No new complaints.   Assessment & Plan: Wernicke's encephalopathy/ETOH dependence  On admission classic triad encephalopathy, oculomotor dysfunction and gait ataxia on presentation.  Case discussed with Neurology - recommended continue current thiamine treatment. Patient meets Henry Russel criteria with classic triad for the diagnosis AMS, Nystagmus and ataxia  MRI showing bilateral thalamus abnormalities Already received thiamine IV 500 mg TID x 2 days, now on 250 mg IV x 5 day, then PO 100 mg daily.  Continue MVI and Folate  Continue CIWA monitoring Continue to monitor   UTI - UA grossly abnormal  Will treat as patient had some confusion, less likely due to UTI but will cover for at least 3 days. On rocephin day 3, urine culture was not send by lab, will keep rocephin for 2 more days as patient is clinically improving   Hypokalemia 2/2 ETOH abuse  Resolved   LFT elevated c/w alcohol abuse  Trending down   Anemia likely chronic due to alcoholism, further drop likely dilutional, improved  No signs of overt bleeding Anemia panel normal, B12 normal  Get FOBT  Transfuse if Hgb less than 7 Check cbc in AM   DVT prophylaxis: Lovenox  Code Status: FULL  Family Communication: None at bedside    Disposition Plan: Will remain in patient for few more day as she need IV thiamine   Consultants:   None   Procedures:   None   Antimicrobials:  None    Objective: Vitals:   12/25/16 0500 12/25/16 1420 12/25/16 2102 12/26/16 0606  BP: 105/73 108/75 99/72 95/64   Pulse: 92 95 92 88  Resp: Temp: 98 F (36.7 C) 98.1 F (36.7 C) 98.5 F (36.9 C) 98.3 F (36.8 C)  TempSrc: Oral Oral Oral Oral  SpO2: 100% 100% 98% 98%  Weight:      Height:        Intake/Output Summary (Last 24 hours) at 12/26/16 1654 Last data filed at 12/25/16 1900  Gross per 24 hour  Intake              240 ml  Output                1 ml  Net              239 ml   Filed Weights   12/22/16 1511  Weight: 58.1 kg (128 lb)    Examination:   General exam: Calm  EENT: No horizontal nystagmus noted  Respiratory system: Clear to auscultation  Cardiovascular system: Regula rate and rhythm  Gastrointestinal system: Abdomen NTND   Central nervous system: Non focal, + gait ataxia, Romber sign + Extremities: No edema  Skin: No lesions  Psychiatry: Alert and oriented x 4    Data Reviewed: I have personally reviewed following  labs and imaging studies  CBC:  Recent Labs Lab 12/22/16 1720 12/22/16 1732 12/23/16 0546 12/24/16 0548 12/25/16 0554 12/26/16 0509  WBC 7.7  --  6.8 7.2 7.3 6.1  NEUTROABS 4.9  --   --  3.9 3.7  --   HGB 9.7* 10.5* 7.8* 8.4* 8.8* 7.6*  HCT 27.6* 31.0* 22.5* 24.4* 25.1* 21.4*  MCV 92.3  --  92.2 93.1 92.6 92.6  PLT 274  --  207 219 290 228   Basic Metabolic Panel:  Recent Labs Lab 12/22/16 1720 12/22/16 1732 12/23/16 0546 12/24/16 0548 12/25/16 0554  NA 135 133* 139 137 136  K 2.6* 2.6* 4.1 3.7 4.9  CL 89* 89* 102 104 100*  CO2 30  --  GLUCOSE 102* 102* 74 91 86  BUN 27* 24* 22* 19 14  CREATININE 0.76 0.60 0.50 0.59 0.66  CALCIUM 9.9  --  8.7* 9.0 8.9  MG 1.8  --   --  1.2* 1.6*  PHOS  --   --   --  1.2* 3.3   GFR: Estimated  Creatinine Clearance: 74.9 mL/min (by C-G formula based on SCr of 0.66 mg/dL). Liver Function Tests:  Recent Labs Lab 12/22/16 1720 12/24/16 0548 12/25/16 0554  AST 154* 155* 116*  ALT 52 55* 52  ALKPHOS 107 97 100  BILITOT 1.6* 0.8 1.2  PROT 7.2 5.2* 5.7*  ALBUMIN 3.8 2.7* 2.8*   No results for input(s): LIPASE, AMYLASE in the last 168 hours.  Recent Labs Lab 12/22/16 1942  AMMONIA 32   Coagulation Profile:  Recent Labs Lab 12/22/16 2334  INR 1.06   Thyroid Function Tests: No results for input(s): TSH, T4TOTAL, FREET4, T3FREE, THYROIDAB in the last 72 hours.  Radiology Studies: No results found.  Scheduled Meds: . docusate sodium  100 mg Oral BID  . enoxaparin (LOVENOX) injection  40 mg Subcutaneous QHS  . feeding supplement (ENSURE ENLIVE)  237 mL Oral BID BM  . folic acid  1 mg Oral Daily  . multivitamin with minerals  1 tablet Oral Daily  . nicotine  7 mg Transdermal Daily  . sodium chloride flush  3 mL Intravenous Q12H  . thiamine injection  250 mg Intravenous Daily   Continuous Infusions: . cefTRIAXone (ROCEPHIN)  IV Stopped (12/25/16 2106)     LOS: 4 days   Latrelle Dodrill, MD Pager: Text Page via [email or web address]  [phone]  If 7PM-7AM, please contact night-coverage [email or web address] Password [email or web address] 12/26/2016, 4:54 PM

## 2016-12-26 NOTE — Clinical Social Work Note (Signed)
Clinical Social Work Assessment  Patient Details  Name: GLADY OUDERKIRK MRN: [id number] Date of Birth: 04/03/1966  Date of referral:  12/26/16               Reason for consult:  Facility Placement, Substance Use/ETOH Abuse                Permission sought to share information with:  Facility Medical sales representative, Family Supports Permission granted to share information::  No (pt disoriented)  Name::     Sales promotion account executive::  SNF  Relationship::  dtr  Contact Information:     Housing/Transportation Living arrangements for the past 2 months:  Single Family Home Source of Information:  Adult Children Patient Interpreter Needed:  None Criminal Activity/Legal Involvement Pertinent to Current Situation/Hospitalization:  No - Comment as needed Significant Relationships:  Adult Children, Parents Lives with:  Self Do you feel safe going back to the place where you live?  No Need for family participation in patient care:  Yes (Comment) (decision making at this time)  Care giving concerns:  Pt lives at home alone- per dtr no one in their family is willing to help patient anymore.  Pt currently with increased weakness and confusion and not safe to return home alone.   Social Worker assessment / plan:  Pt disoriented and in active withdrawal according to chart- did not attempt to assess with pt in person at this time.  CSW spoke with pt oldest dtr Deonna regarding plan for pt at time of DC.  Dtr states this is a cycle that pt drinks heavily and is found incapable of caring for herself- states that she found pt in her own feces prior to this admission.  States that she has helped care for pt in these situations in the past but then pt goes right back to drinking and pushes everyone away.  Pt dtr reports that pt has been calling family members and requesting they bring in alcohol during this admission.  Pt dtr states that pt had $7,000 in the bank just a few months ago and now she only has a few  hundred- states pt has been in a downward spiral the last few months.  States pt has lost her job ( was working at [employer] but hasn't worked for 3 months- states her job tried to send pt to rehab and she refused)  CSW discussed current recommendation for SNF- explained SNF and SNF referral process- pt dtr is agreeable to pt going since no one would be willing to take pt home. CSW explained that if pt regained orientation she would be able to make her own decision to go home.  Employment status:  Unemployed (pt dtr reports she was let go from job after not showing up for 3 months) Insurance information:  Managed Care PT Recommendations:  Skilled Nursing Facility Information / Referral to community resources:  Skilled Nursing Facility  Patient/Family's Response to care:  Pt dtr agreeable to SNF stay and hopeful that pt will listen to medical professionals since she does not listen to family.  Patient/Family's Understanding of and Emotional Response to Diagnosis, Current Treatment, and Prognosis:  Per dtr report pt has bad understanding of her condition and continues to drink heavily despite recurrent medical issues.  Emotional Assessment Appearance:    Attitude/Demeanor/Rapport:  Unable to Assess Affect (typically observed):  Unable to Assess Orientation:  Oriented to Self, Oriented to Place, Oriented to Situation Alcohol / Substance use:  Alcohol Use Psych  involvement (Current and /or in the community):  No (Comment)  Discharge Needs  Concerns to be addressed:  Substance Abuse Concerns, Care Coordination Readmission within the last 30 days:    Current discharge risk:  Physical Impairment, Lives alone, Substance Abuse, Lack of support system Barriers to Discharge:  Continued Medical Work up, Active Substance Use   Burna Sis, LCSW 12/26/2016, 3:49 PM

## 2016-12-26 NOTE — Progress Notes (Signed)
Physical Therapy Treatment Patient Details Name: Virginia Hess MRN: [id number] DOB: [birth year] Today's Date: 12/26/2016    History of Present Illness 51 y/o F with hx of alcohol dependence (recovery for 7 years, relapsed in May 2017 per chart) presented to the ED c/o leg weakness. Patient was admitted for suspected Wernicke's encephalopathy    PT Comments    The patient is able to ambulate short distances with Rw and assistance. Gait  Is unsteady. Relies on upper body.  Continue PT.   Follow Up Recommendations  Supervision/Assistance - 24 hour;SNF     Equipment Recommendations  Rolling walker with 5" wheels    Recommendations for Other Services       Precautions / Restrictions Precautions Precautions: Fall    Mobility  Bed Mobility Overal bed mobility: Needs Assistance Bed Mobility: Supine to Sit     Supine to sit: Supervision     General bed mobility comments: pt reports that she is self assisting to [facility].  Transfers Overall transfer level: Needs assistance Equipment used: Rolling walker (2 wheeled) Transfers: Sit to/from [employer] Sit to Stand: Min assist Stand pivot transfers: Supervision       General transfer comment: requires steady assist to power up from [facility] and recline to fully stand, sheis able to perform squat pivot to [facility].  Ambulation/Gait Ambulation/Gait assistance: +2 safety/equipment;Mod assist Ambulation Distance (Feet): 25 Feet (x2) Assistive device: Rolling walker (2 wheeled) Gait Pattern/deviations: Wide base of support;Ataxic;Decreased step length - right;Decreased step length - left     General Gait Details: tends to have RW ahead of her. PT required to hold RW back   Stairs            Wheelchair Mobility    Modified Rankin (Stroke Patients Only)       Balance Overall balance assessment: Needs assistance;History of Falls Sitting-balance support: Feet supported;Bilateral upper extremity  supported Sitting balance-Leahy Scale: Fair     Standing balance support: During functional activity;Bilateral upper extremity supported Standing balance-Leahy Scale: Poor                              Cognition Arousal/Alertness: Awake/alert Behavior During Therapy: WFL for tasks assessed/performed Overall Cognitive Status: No family/caregiver present to determine baseline cognitive functioning                                 General Comments: states that she may go stay with her daughter      Exercises      General Comments        Pertinent Vitals/Pain Pain Assessment: No/denies pain    Home Living                      Prior Function            PT Goals (current goals can now be found in the care plan section) Progress towards PT goals: Progressing toward goals    Frequency    Min 3X/week      PT Plan Current plan remains appropriate    Co-evaluation             End of Session Equipment Utilized During Treatment: Gait belt Activity Tolerance: Patient tolerated treatment well Patient left: in chair;with call bell/phone within reach;with chair alarm set Nurse Communication: Mobility status       Time: 4098-1191 PT  Time Calculation (min) (ACUTE ONLY): 12 min  Charges:  $Gait Training: 8-22 mins                    G CodesBlanchard Kelch PT [phone] }   Rada Hay 12/26/2016, 4:51 PM

## 2016-12-27 LAB — COMPREHENSIVE METABOLIC PANEL
ALT: 45 U/L (ref 14–54)
ANION GAP: 6 (ref 5–15)
AST: 77 U/L — ABNORMAL HIGH (ref 15–41)
Albumin: 3 g/dL — ABNORMAL LOW (ref 3.5–5.0)
Alkaline Phosphatase: 77 U/L (ref 38–126)
BUN: 9 mg/dL (ref 6–20)
CALCIUM: 9 mg/dL (ref 8.9–10.3)
CHLORIDE: 103 mmol/L (ref 101–111)
CO2: 27 mmol/L (ref 22–32)
Creatinine, Ser: 0.52 mg/dL (ref 0.44–1.00)
GFR calc non Af Amer: >60 mL/min (ref >60)
Glucose, Bld: 89 mg/dL (ref 65–99)
POTASSIUM: 3.8 mmol/L (ref 3.5–5.1)
SODIUM: 136 mmol/L (ref 135–145)
TOTAL PROTEIN: 5.6 g/dL — AB (ref 6.5–8.1)
Total Bilirubin: 0.8 mg/dL (ref 0.3–1.2)

## 2016-12-27 LAB — HEMOGLOBIN AND HEMATOCRIT, BLOOD
HCT: 24.8 % — ABNORMAL LOW (ref 36.0–46.0)
HEMATOCRIT: 23.8 % — AB (ref 36.0–46.0)
HEMOGLOBIN: 8.2 g/dL — AB (ref 12.0–15.0)
HEMOGLOBIN: 7.8 g/dL — AB (ref 12.0–15.0)

## 2016-12-27 MED ORDER — THIAMINE HCL 100 MG/ML IJ SOLN
Freq: Once | INTRAVENOUS | Status: AC
  Administered 2016-12-28: 09:00:00 via INTRAVENOUS
  Filled 2016-12-27: qty 50

## 2016-12-27 NOTE — Progress Notes (Signed)
Patient c/o burning at IV site during thiamine administration.  Discussed with pharmacist.  Pharmacy will dispense thiamine in IVPB form in order to decrease patient's comfort during administration.  Will administer next dose via this route as scheduled.

## 2016-12-27 NOTE — Progress Notes (Signed)
PROGRESS NOTE [facility]   Virginia Hess   ZOX:[id number] DOB: 26-Mar-1966  DOA: 12/22/2016 PCP: No PCP Per Patient   Brief Narrative:  51 y/o F with hx of alcohol dependence presented to the ED c/o leg weakness. Patient was admitted for suspected Wernicke's encephalopathy. Patient tells me that she came in due to " fall about 2 days ago and she was having leg discomfort" Patient can't recall what happened last night in the ED. Patient history varies, but she reports that she drinks occasionally. Last drink was Saturday per patient. Patient is receiving IV thiamine and MRI was performed confirming Wernicke's diagnosis.   Subjective: No new complaints, continues to improves, Needs 2 more dose of IV thiamine   Assessment & Plan: Wernicke's encephalopathy/ETOH dependence  On admission classic triad encephalopathy, oculomotor dysfunction and gait ataxia on presentation.  Case discussed with Neurology - recommended continue current thiamine treatment. Patient meets Henry Russel criteria with classic triad for the diagnosis AMS, Nystagmus and ataxia  MRI showing bilateral thalamus abnormalities Already received thiamine IV 500 mg TID x 2 days, now on 250 mg IV x 5 day, then PO 100 mg daily.  Continue MVI and Folate  Discontinue CIWA monitoring patient out of the window  Alcohol cessation discussed  Continue to monitor   UTI - UA grossly abnormal  Will treat as patient had some confusion, less likely due to UTI but will cover Urine culture was not send by lab,  patient is clinically improving. Received total of 5 days of rocephin  Hypokalemia 2/2 ETOH abuse  Resolved   LFT elevated c/w alcohol abuse  Trending down   Anemia likely chronic due to alcoholism, further drop likely dilutional, improved  No signs of overt bleeding Anemia panel normal, B12 normal  FOBT pending  Transfuse if Hgb less than 7 Check cbc in AM   DVT prophylaxis: Lovenox  Code Status: FULL  Family  Communication: None at bedside  Disposition Plan: Likely to be d/c in AM after IV thiamine to SNF   Consultants:   None   Procedures:   None   Antimicrobials:  None    Objective: Vitals:   12/26/16 1455 12/27/16 0000 12/27/16 0505 12/27/16 1454  BP: 102/70 104/80 97/66 92/61   Pulse: 90 96 81 85  Resp: Temp: 98.3 F (36.8 C) 98.6 F (37 C) 98.4 F (36.9 C) 98.4 F (36.9 C)  TempSrc: Other (Comment) Oral Oral Oral  SpO2: 98% 100% 100% 100%  Weight:      Height:        Intake/Output Summary (Last 24 hours) at 12/27/16 1714 Last data filed at 12/27/16 1454  Gross per 24 hour  Intake             1010 ml  Output                0 ml  Net             1010 ml   Filed Weights   12/22/16 1511  Weight: 58.1 kg (128 lb)    Examination:   General exam: NAD EENT: No nystagmus  Respiratory system: CTA  Cardiovascular system: S1S2 no murmurs   Central nervous system: No focal, still some ataxia and romber positive  Extremities: No LE edema   Data Reviewed: I have personally reviewed following labs and imaging studies  CBC:  Recent Labs Lab 12/22/16 1720  12/23/16 0546 12/24/16 0548 12/25/16 0554 12/26/16 0509 12/27/16 0531  WBC 7.7  --  6.8 7.2 7.3 6.1  --   NEUTROABS 4.9  --   --  3.9 3.7  --   --   HGB 9.7*  < > 7.8* 8.4* 8.8* 7.6* 8.2*  HCT 27.6*  < > 22.5* 24.4* 25.1* 21.4* 24.8*  MCV 92.3  --  92.2 93.1 92.6 92.6  --   PLT 274  --  207 219 290 228  --   < > = values in this interval not displayed. Basic Metabolic Panel:  Recent Labs Lab 12/22/16 1720 12/22/16 1732 12/23/16 0546 12/24/16 0548 12/25/16 0554 12/27/16 0531  NA 135 133* 139 137 136 136  K 2.6* 2.6* 4.1 3.7 4.9 3.8  CL 89* 89* 102 104 100* 103  CO2 30  --  GLUCOSE 102* 102* 74 91 86 89  BUN 27* 24* 22* CREATININE 0.76 0.60 0.50 0.59 0.66 0.52  CALCIUM 9.9  --  8.7* 9.0 8.9 9.0  MG 1.8  --   --  1.2* 1.6*  --   PHOS  --   --   --  1.2* 3.3  --     GFR: Estimated Creatinine Clearance: 74.9 mL/min (by C-G formula based on SCr of 0.52 mg/dL). Liver Function Tests:  Recent Labs Lab 12/22/16 1720 12/24/16 0548 12/25/16 0554 12/27/16 0531  AST 154* 155* 116* 77*  ALT 52 55* 52 45  ALKPHOS 107 97 100 77  BILITOT 1.6* 0.8 1.2 0.8  PROT 7.2 5.2* 5.7* 5.6*  ALBUMIN 3.8 2.7* 2.8* 3.0*   No results for input(s): LIPASE, AMYLASE in the last 168 hours.  Recent Labs Lab 12/22/16 1942  AMMONIA 32   Coagulation Profile:  Recent Labs Lab 12/22/16 2334  INR 1.06   Thyroid Function Tests: No results for input(s): TSH, T4TOTAL, FREET4, T3FREE, THYROIDAB in the last 72 hours.  Radiology Studies: No results found.  Scheduled Meds: . docusate sodium  100 mg Oral BID  . enoxaparin (LOVENOX) injection  40 mg Subcutaneous QHS  . feeding supplement (ENSURE ENLIVE)  237 mL Oral BID BM  . folic acid  1 mg Oral Daily  . multivitamin with minerals  1 tablet Oral Daily  . nicotine  7 mg Transdermal Daily  . sodium chloride flush  3 mL Intravenous Q12H   Continuous Infusions: . cefTRIAXone (ROCEPHIN)  IV Stopped (12/26/16 2100)  . [START ON 12/28/2016] sodium chloride 0.9 % 50 mL with thiamine (B-1) 250 mg infusion       LOS: 5 days   Latrelle Dodrill, MD Pager: Text Page via [email or web address]  [phone]  If 7PM-7AM, please contact night-coverage [email or web address] Password [address] [facility] 12/27/2016, 5:14 PM

## 2016-12-28 LAB — OCCULT BLOOD X 1 CARD TO LAB, STOOL: FECAL OCCULT BLD: NEGATIVE

## 2016-12-28 MED ORDER — THIAMINE HCL 100 MG PO TABS: 100.0000 mg | ORAL_TABLET | Freq: Every day | ORAL | 0 refills | Status: AC

## 2016-12-28 MED ORDER — NICOTINE 7 MG/24HR TD PT24: 7.0000 mg | MEDICATED_PATCH | Freq: Every day | TRANSDERMAL | 0 refills | Status: DC

## 2016-12-28 NOTE — Clinical Social Work Placement (Signed)
Patient received and accepted bed offer at [facility]. Patient to be transported to facility by daughter. Patient's RN can call report to [phone], patient going to room 112.   CLINICAL SOCIAL WORK PLACEMENT  NOTE  Date:  12/28/2016  Patient Details  Name: Virginia Hess MRN: [id number] Date of Birth: [birth year]  Clinical Social Work is seeking post-discharge placement for this patient at the Skilled  Nursing Facility level of care (*CSW will initial, date and re-position this form in  chart as items are completed):  Yes   Patient/family provided with [place] Clinical Social Work Department's list of facilities offering this level of care within the geographic area requested by the patient (or if unable, by the patient's family).  Yes   Patient/family informed of their freedom to choose among providers that offer the needed level of care, that participate in Medicare, Medicaid or managed care program needed by the patient, have an available bed and are willing to accept the patient.  Yes   Patient/family informed of [facility]'s ownership interest in [facility] and [facility], as well as of the fact that they are under no obligation to receive care at these facilities.  PASRR submitted to EDS on 12/26/16     PASRR number received on 12/26/16     Existing PASRR number confirmed on       FL2 transmitted to all facilities in geographic area requested by pt/family on 12/26/16     FL2 transmitted to all facilities within larger geographic area on       Patient informed that his/her managed care company has contracts with or will negotiate with certain facilities, including the following:        Yes   Patient/family informed of bed offers received.  Patient chooses bed at [facility]     Physician recommends and patient chooses bed at      Patient to be transferred to [facility] on 12/28/16.  Patient to be transferred  to facility by Daughter      Patient family notified on   of transfer.  Name of family member notified:  Patient to be transported by daughter to facility      PHYSICIAN       Additional Comment:    _______________________________________________ Antionette Poles, LCSW 12/28/2016, 2:49 PM

## 2016-12-28 NOTE — Care Management Note (Signed)
Case Management Note  Patient Details  Name: Virginia Hess MRN: [id number] Date of Birth: 05-Jun-1966  Subjective/Objective:                    Action/Plan:d/c SNF.   Expected Discharge Date:                  Expected Discharge Plan:  Skilled Nursing Facility  In-House Referral:  Clinical Social Work  Discharge planning Services  CM Consult  Post Acute Care Choice:    Choice offered to:     DME Arranged:    DME Agency:     HH Arranged:    HH Agency:     Status of Service:  Completed, signed off  If discussed at [employer], dates discussed:    Additional Comments:  Lanier Clam, RN 12/28/2016, 11:53 AM

## 2016-12-28 NOTE — Discharge Summary (Signed)
Physician Discharge Summary  TONEE SILVERSTEIN  ZOX:[id number]  DOB: [birth year]  DOA: 12/22/2016 PCP: No PCP Per Patient  Admit date: 12/22/2016 Discharge date: 12/28/2016  Admitted From: Home Disposition:  SNF  Recommendations for Outpatient Follow-up:  1. Follow up with PCP in 1-2 weeks 2. Please obtain CMP/CBC in one week to check for hemoglobin and liver function  Equipment/Devices: rolling walker   Discharge Condition: Stable CODE STATUS: Full  Diet recommendation: Heart Healthy   Brief/Interim Summary: 51 y/o F with hx of alcohol dependence presented to the ED c/o leg weakness. Patient was admitted for suspected Wernicke's encephalopathy. Patient tells me that she came in due to " fall about 2 days ago and she was having leg discomfort" Patient can't recall what happened last night in the ED. Patient history varies, but she reports that she drinks occasionally. Last drink was Saturday PTA. Patient received IV thiamine and MRI was performed confirming Wernicke's diagnosis. Patient clinically improving, asthma has resolved altered mental status has resolved only remaining symptom is mild gait ataxia. Patient was noted to be anemic for which anemia workup was done with normal results. Patient was operated but physical therapist who recommended SNF for physical rehabilitation and strengthening. Patient deemed stable to be discharged for further rehabilitation.  Subjective: Patient seen and examined, have no complaints. Denies chest pain, SOB, palpitations and dizziness. Tolerating diet well, afebrile and working with PT w/o issues  Discharge Diagnoses/Hospital Course:  Wernicke's encephalopathy/ETOH dependence  On admission classic triad encephalopathy, oculomotor dysfunction and gait ataxia on presentation.  Case discussed with Neurology - recommended continue current thiamine treatment. Patient meets Henry Russel criteria with classic triad for the diagnosis AMS, Nystagmus and ataxia  MRI  showing bilateral thalamus abnormalities Already received thiamine IV 500 mg TID x 2 days, 250 mg IV x 5 day, now on PO 100 mg daily.  Continue MVI and Folate  Alcohol cessation discussed   UTI - UA grossly abnormal  Will treat as patient had some confusion, less likely due to UTI but will cover Urine culture was not send by lab,  patient is clinically improving. Received total of 5 days of rocephin  Hypokalemia 2/2 ETOH abuse  Resolved   LFT elevated c/w alcohol abuse  Trending down  Check in 1 week   Anemia likely chronic due to alcoholism, further drop likely dilutional. No signs of overt bleeding and patient asymptomatic  Anemia panel normal, B12 normal  FOBT negative No further workup needed at this time  Repeat CBC in 1 week   All other chronic medical condition were stable during the hospitalization.  Patient was seen by physical therapy, recommending SNF  On the day of the discharge the patient's vitals were stable, and no other acute medical condition were reported by patient. the patient was felt safe to be discharge to SNF  Discharge Instructions  You were cared for by a hospitalist during your hospital stay. If you have any questions about your discharge medications or the care you received while you were in the hospital after you are discharged, you can call the unit and asked to speak with the hospitalist on call if the hospitalist that took care of you is not available. Once you are discharged, your primary care physician will handle any further medical issues. Please note that NO REFILLS for any discharge medications will be authorized once you are discharged, as it is imperative that you return to your primary care physician (or establish a relationship with a primary  care physician if you do not have one) for your aftercare needs so that they can reassess your need for medications and monitor your lab values.  Discharge Instructions    Call MD for:  difficulty  breathing, headache or visual disturbances    Complete by:  As directed    Call MD for:  extreme fatigue    Complete by:  As directed    Call MD for:  hives    Complete by:  As directed    Call MD for:  persistant dizziness or light-headedness    Complete by:  As directed    Call MD for:  persistant nausea and vomiting    Complete by:  As directed    Call MD for:  redness, tenderness, or signs of infection (pain, swelling, redness, odor or green/yellow discharge around incision site)    Complete by:  As directed    Call MD for:  severe uncontrolled pain    Complete by:  As directed    Call MD for:  temperature >100.4    Complete by:  As directed    Diet - low sodium heart healthy    Complete by:  As directed    Increase activity slowly    Complete by:  As directed      Allergies as of 12/28/2016   No Known Allergies     Medication List    TAKE these medications   acetaminophen 500 MG tablet Commonly known as:  TYLENOL Take 500 mg by mouth every 6 (six) hours as needed for moderate pain.   albuterol 108 (90 Base) MCG/ACT inhaler Commonly known as:  PROVENTIL HFA;VENTOLIN HFA Inhale 2 puffs into the lungs every 4 (four) hours as needed for wheezing or shortness of breath.   feeding supplement (ENSURE ENLIVE) Liqd Take 237 mLs by mouth 2 (two) times daily between meals.   folic acid 1 MG tablet Commonly known as:  FOLVITE Take 1 tablet (1 mg total) by mouth daily.   multivitamin with minerals Tabs tablet Take 1 tablet by mouth daily.   nicotine 7 mg/24hr patch Commonly known as:  NICODERM CQ - dosed in mg/24 hr Place 1 patch (7 mg total) onto the skin daily.   thiamine 100 MG tablet Take 1 tablet (100 mg total) by mouth daily. What changed:  medication strength  how much to take       No Known Allergies  Consultations:  Neurology   Procedures/Studies: Dg Abd 1 View  Result Date: 12/23/2016 CLINICAL DATA:  Confusion, nausea, bilateral leg numbness  EXAM: ABDOMEN - 1 VIEW COMPARISON:  None. FINDINGS: Normal small bowel gas pattern. Some colonic gas noted in splenic flexure of the colon. Some colonic stool and gas noted in distal sigmoid colon. IMPRESSION: Negative. Electronically Signed   By: Natasha Mead M.D.   On: 12/23/2016 09:47   Ct Head Wo Contrast  Result Date: 12/22/2016 CLINICAL DATA:  Numbness and weakness in both legs for over 2 weeks. Alcohol abuse. EXAM: CT HEAD WITHOUT CONTRAST TECHNIQUE: Contiguous axial images were obtained from the base of the skull through the vertex without intravenous contrast. COMPARISON:  CT head without contrast 10/26/2007 FINDINGS: Brain: Mild generalized atrophy is present. Atrophy of the superior vermis is noted. No acute infarct, hemorrhage, or mass lesion is present. The ventricles are of normal size. No significant extraaxial fluid collection is present. Vascular: No hyperdense vessel or unexpected calcification. Skull: No focal lytic or blastic lesions are present. Calvarium is intact.  Sinuses/Orbits: The paranasal sinuses and mastoid air cells are clear. The visualized orbits are normal. IMPRESSION: 1. No acute intracranial abnormality. 2. Mild generalized atrophy and superior vermian atrophy may be related to alcohol use. Electronically Signed   By: Marin Roberts M.D.   On: 12/22/2016 19:39   Mr Laqueta Jean ZO Contrast  Result Date: 12/23/2016 CLINICAL DATA:  Next diagnosis and altered mental status. EXAM: MRI HEAD WITHOUT AND WITH CONTRAST TECHNIQUE: Multiplanar, multiecho pulse sequences of the brain and surrounding structures were obtained without and with intravenous contrast. CONTRAST:  11mL MULTIHANCE GADOBENATE DIMEGLUMINE 529 MG/ML IV SOLN COMPARISON:  Head CT from yesterday FINDINGS: Brain: There is a band of T2 hyperintensity along the bilateral medial thalamus. These areas show weak restricted diffusion. There could be mild periaqueductal gray T2 hyperintensity, but limited by the degree of  motion. No definite signal abnormality in the tectum or fourth ventricular floor. No detected abnormal enhancement, although again limited by motion. The mamillary bodies in particular do not show evidence of enhancement. Given the clinical suspicion, findings are compatible with Wernicke's encephalopathy. Bilateral medial thalamus infarct is a differential consideration, but considered much less likely given clinical circumstances. No hemorrhage, hydrocephalus, or mass. Mild periventricular FLAIR hyperintensity. Vascular: Major flow voids are preserved. Skull and upper cervical spine: Negative Sinuses/Orbits: Negative IMPRESSION: 1. Bilateral medial thalamus signal abnormality as seen with the suspected Wernicke's encephalopathy. 2. Intermittently motion degraded exam causing some nondiagnostic sequences/images. Electronically Signed   By: Marnee Spring M.D.   On: 12/23/2016 12:12   Dg Chest Port 1 View  Result Date: 12/23/2016 CLINICAL DATA:  Confusion, numbness and weakness in the legs. Nausea. EXAM: PORTABLE CHEST 1 VIEW COMPARISON:  07/03/2016. FINDINGS: The heart size and mediastinal contours are within normal limits. Both lungs are clear. The visualized skeletal structures are unremarkable. IMPRESSION: No active disease.  Stable exam.  No change from priors. Electronically Signed   By: Elsie Stain M.D.   On: 12/23/2016 10:04    Discharge Exam: Vitals:   12/28/16 0013 12/28/16 0443  BP: 94/68 103/73  Pulse: 92 94  Resp: 18 16  Temp: 98.6 F (37 C) 98.1 F (36.7 C)   Vitals:   12/27/16 1454 12/27/16 2115 12/28/16 0013 12/28/16 0443  BP: 92/61 116/75 94/68 103/73  Pulse: 85 91 92 94  Resp: Temp: 98.4 F (36.9 C) 98.1 F (36.7 C) 98.6 F (37 C) 98.1 F (36.7 C)  TempSrc: Oral Oral Oral Oral  SpO2: 100% 100% 100% 100%  Weight:      Height:        General: Pt is alert, awake, not in acute distress Cardiovascular: RRR, S1/S2 +, no rubs, no gallops Respiratory: CTA  bilaterally, no wheezing, no rhonchi Abdominal: Soft, NT, ND, bowel sounds + Neurology: Gait ataxia, no nystagmus Extremities: no edema, no cyanosis   The results of significant diagnostics from this hospitalization (including imaging, microbiology, ancillary and laboratory) are listed below for reference.    Basic Metabolic Panel:  Recent Labs Lab 12/22/16 1720 12/22/16 1732 12/23/16 0546 12/24/16 0548 12/25/16 0554 12/27/16 0531  NA 135 133* 139 137 136 136  K 2.6* 2.6* 4.1 3.7 4.9 3.8  CL 89* 89* 102 104 100* 103  CO2 30  --  GLUCOSE 102* 102* 74 91 86 89  BUN 27* 24* 22* CREATININE 0.76 0.60 0.50 0.59 0.66 0.52  CALCIUM 9.9  --  8.7*  9.0 8.9 9.0  MG 1.8  --   --  1.2* 1.6*  --   PHOS  --   --   --  1.2* 3.3  --    Liver Function Tests:  Recent Labs Lab 12/22/16 1720 12/24/16 0548 12/25/16 0554 12/27/16 0531  AST 154* 155* 116* 77*  ALT 52 55* 52 45  ALKPHOS 107 97 100 77  BILITOT 1.6* 0.8 1.2 0.8  PROT 7.2 5.2* 5.7* 5.6*  ALBUMIN 3.8 2.7* 2.8* 3.0*    Recent Labs Lab 12/22/16 1942  AMMONIA 32   CBC:  Recent Labs Lab 12/22/16 1720  12/23/16 0546 12/24/16 0548 12/25/16 0554 12/26/16 0509 12/27/16 0531 12/27/16 1744  WBC 7.7  --  6.8 7.2 7.3 6.1  --   --   NEUTROABS 4.9  --   --  3.9 3.7  --   --   --   HGB 9.7*  < > 7.8* 8.4* 8.8* 7.6* 8.2* 7.8*  HCT 27.6*  < > 22.5* 24.4* 25.1* 21.4* 24.8* 23.8*  MCV 92.3  --  92.2 93.1 92.6 92.6  --   --   PLT 274  --  207 219 290 228  --   --   < > = values in this interval not displayed.  Urinalysis    Component Value Date/Time   COLORURINE AMBER (A) 12/22/2016 1903   APPEARANCEUR HAZY (A) 12/22/2016 1903   LABSPEC 1.024 12/22/2016 1903   PHURINE 5.0 12/22/2016 1903   GLUCOSEU NEGATIVE 12/22/2016 1903   HGBUR SMALL (A) 12/22/2016 1903   BILIRUBINUR MODERATE (A) 12/22/2016 1903   KETONESUR 20 (A) 12/22/2016 1903   PROTEINUR 100 (A) 12/22/2016 1903   UROBILINOGEN 1.0 10/26/2007  1330   NITRITE NEGATIVE 12/22/2016 1903   LEUKOCYTESUR MODERATE (A) 12/22/2016 1903   Sepsis Labs Invalid input(s): PROCALCITONIN,  WBC,  LACTICIDVEN Microbiology No results found for this or any previous visit (from the past 240 hour(s)).   Time coordinating discharge: 32 minutes  SIGNED:  Latrelle Dodrill, MD  [facility] 12/28/2016, 2:54 PM  Pager please text page via  [email or web address] Password [email or web address]

## 2016-12-28 NOTE — Progress Notes (Signed)
Physical Therapy Treatment Patient Details Name: Virginia Hess MRN: [id number] DOB: [birth year] Today's Date: 12/28/2016    History of Present Illness 51 y/o F with hx of alcohol dependence (recovery for 7 years, relapsed in May 2017 per chart) presented to the ED c/o leg weakness. Patient was admitted for suspected Wernicke's encephalopathy    PT Comments    Progressing slowly with mobility. Gait remains ataxic. Pt fatigues easily with activity. Continue to recommend SNF for rehab.    Follow Up Recommendations  SNF     Equipment Recommendations  Rolling walker with 5" wheels    Recommendations for Other Services       Precautions / Restrictions Precautions Precautions: Fall Restrictions Weight Bearing Restrictions: No    Mobility  Bed Mobility Overal bed mobility: Needs Assistance Bed Mobility: Supine to Sit;Sit to Supine     Supine to sit: Supervision Sit to supine: Supervision   General bed mobility comments: for safety  Transfers Overall transfer level: Needs assistance Equipment used: Rolling walker (2 wheeled) Transfers: Sit to/from Stand Sit to Stand: Min assist         General transfer comment: assist to rise, steady. cues for safety, hand placement. Pt c/o dizziness with initial standing so stood at bedside for 1-2 mintues before ambulating.  Ambulation/Gait Ambulation/Gait assistance: Min assist Ambulation Distance (Feet): 40 Feet Assistive device: Rolling walker (2 wheeled) Gait Pattern/deviations: Step-to pattern;Step-through pattern;Decreased stride length;Ataxic     General Gait Details: Cues for safe walker use. Ataxia still noted with poor fluidity of movement. Several brief standing rest breaks required. Pt fatigues easily.    Stairs            Wheelchair Mobility    Modified Rankin (Stroke Patients Only)       Balance           Standing balance support: Bilateral upper extremity supported Standing balance-Leahy  Scale: Poor                              Cognition Arousal/Alertness: Awake/alert Behavior During Therapy: WFL for tasks assessed/performed Overall Cognitive Status: Within Functional Limits for tasks assessed                                        Exercises      General Comments        Pertinent Vitals/Pain Pain Assessment: No/denies pain    Home Living                      Prior Function            PT Goals (current goals can now be found in the care plan section) Progress towards PT goals: Progressing toward goals    Frequency    Min 3X/week      PT Plan Current plan remains appropriate    Co-evaluation             End of Session Equipment Utilized During Treatment: Gait belt Activity Tolerance: Patient limited by fatigue Patient left: in bed;with call bell/phone within reach;with bed alarm set   PT Visit Diagnosis: Muscle weakness (generalized) (M62.81);Difficulty in walking, not elsewhere classified (R26.2)     Time: 4098-1191 PT Time Calculation (min) (ACUTE ONLY): 12 min  Charges:  $Gait Training: 8-22 mins  G Codes:          Weston Anna, MPT Pager: [phone]

## 2016-12-30 ENCOUNTER — Non-Acute Institutional Stay (SKILLED_NURSING_FACILITY): Payer: 59 | Admitting: Internal Medicine

## 2016-12-30 ENCOUNTER — Encounter: Payer: Self-pay | Admitting: Internal Medicine

## 2016-12-30 DIAGNOSIS — R6889 Other general symptoms and signs: Secondary | ICD-10-CM | POA: Insufficient documentation

## 2016-12-30 DIAGNOSIS — N390 Urinary tract infection, site not specified: Secondary | ICD-10-CM | POA: Diagnosis not present

## 2016-12-30 DIAGNOSIS — F321 Major depressive disorder, single episode, moderate: Secondary | ICD-10-CM | POA: Diagnosis not present

## 2016-12-30 DIAGNOSIS — E512 Wernicke's encephalopathy: Secondary | ICD-10-CM | POA: Diagnosis not present

## 2016-12-30 NOTE — Assessment & Plan Note (Signed)
Status post empiric antibiotic regimen, no culture collected

## 2016-12-30 NOTE — Patient Instructions (Signed)
See assessment and plan under each diagnosis in the problem list and acutely for this visit 

## 2016-12-30 NOTE — Assessment & Plan Note (Signed)
Low-dose sertraline trial

## 2016-12-30 NOTE — Progress Notes (Signed)
[facility] Room: 112A  No PCP Per Patient   This is a comprehensive admission note to [facility] performed on this date less than 30 days from date of admission. Included are preadmission medical/surgical history;reconciled medication list; family history; social history and comprehensive review of systems.  Corrections and additions to the records were documented . Comprehensive physical exam was also performed. Additionally a clinical summary was entered for each active diagnosis pertinent to this admission in the Problem List to enhance continuity of care.  HPI: Patient was hospitalized 4/18-4/24/18 is admitted for suspected Wernicke's encephalopathy with oculomotor dysfunction and gait ataxia. MRI revealed bilateral thalamus abnormalities compatible with Wernicke's dx..  The patient gave a history of falling 2 days prior to admission and having residual leg discomfort.  The patient received IV thiamine.Mental status improved but she did have residual mild gait ataxia. She did have anemia without evidence of acute blood loss. Iron panel and B12 were normal. Asthma exacerbation responded to pulmonary toilet. The patient received 5 days Rocephin for abnormal urinalysis. No culture was collected. Predischarge labs revealed hemoglobin of 7.8, AST 77, ALT 45 .The AST peaked at 155 and ALT 55. The patient was discharged to SNF for PT/OT  Past medical and surgical history: She has had 4 pregnancies and 4 deliveries. She denies any surgeries or other medical diagnoses.  Social history: She states that she smoked less than half a pack per day. She states that she would have 2 drinks each night after returning from work.  Family history: Her brother had hypertension. There is no family history of heart attack, stroke, or diabetes.  Review of systems: She describes feeling cold, especially when she was receiving IV fluids. TSH was therapeutic at 3.33 on 4/18 She  had isolated epistaxis this morning but denies any bleeding dyscrasias. She has had intermittent constipation. In the last 24 hours she's had some dependent edema. She has had intermittent depression in the past and previously was on medication. She is willing to consider medication as she finds her self having bouts of crying. She must use walker due to unsteady gait.  Constitutional: No fever,significant weight change, fatigue  Eyes: No redness, discharge, pain, vision change ENT/mouth: No nasal congestion,  purulent discharge, earache,change in hearing ,sore throat  Cardiovascular: No chest pain, palpitations,paroxysmal nocturnal dyspnea, claudication, edema  Respiratory: No cough, sputum production,hemoptysis, DOE , significant snoring,apnea  Gastrointestinal: No heartburn,dysphagia,abdominal pain, nausea / vomiting,rectal bleeding, melena,change in bowels Genitourinary: No dysuria,hematuria, pyuria,  incontinence, nocturia Musculoskeletal: No joint stiffness, joint swelling, weakness,pain Dermatologic: No rash, pruritus, change in appearance of skin Neurologic: No dizziness,headache,syncope, seizures, numbness , tingling Endocrine: No change in hair/skin/ nails, excessive thirst, excessive hunger, excessive urination  Hematologic/lymphatic: No significant bruising, lymphadenopathy,abnormal bleeding Allergy/immunology: No itchy/ watery eyes, significant sneezing, urticaria, angioedema  Physical exam:  Pertinent or positive findings:Thin habitus.Dental hygiene is fair. She exhibits an S4. Breath sounds are decreased. There is dullness to percussion in the right upper quadrant. Reflexes at the knees are 0-1/2+. There is some weakness in the lower extremities. Clubbing of the nailbeds is noted. She has trace pedal edema.  General appearance: no acute distress , increased work of breathing is present.   Lymphatic: No lymphadenopathy about the head, neck, axilla . Eyes: No conjunctival inflammation  or lid edema is present. There is no scleral icterus. Ears:  External ear exam shows no significant lesions or deformities.   Nose:  External nasal examination shows no deformity or  inflammation. Nasal mucosa are pink and moist without lesions ,exudates Oral exam: lips and gums are healthy appearing.There is no oropharyngeal erythema or exudate . Neck:  No thyromegaly, masses, tenderness noted.    Heart:  Normal rate and regular rhythm without gallop, murmur, click, rub .  Lungs:Chest clear to auscultation without wheezes, rhonchi,rales , rubs. Abdomen:Bowel sounds are normal. Abdomen is soft and nontender with no organomegaly, hernias,masses. GU: deferred  Extremities:  No cyanosis, edema  Neurologic exam : Strength equal  in upper & lower extremities Balance,Rhomberg,finger to nose testing could not be completed due to clinical state Deep tendon reflexes are normal &  equal in UE Skin: Warm & dry w/o tenting. No significant lesions or rash.  See clinical summary under each active problem in the Problem List with associated updated therapeutic plan

## 2016-12-30 NOTE — Assessment & Plan Note (Signed)
Clinically resolving with present regimen

## 2016-12-31 ENCOUNTER — Encounter: Payer: Self-pay | Admitting: Internal Medicine

## 2017-01-04 LAB — CBC AND DIFFERENTIAL
HEMATOCRIT: 27 % — AB (ref 36–46)
HEMOGLOBIN: 8.6 g/dL — AB (ref 12.0–16.0)
PLATELETS: 228 10*3/uL (ref 150–399)
WBC: 6.8 10*3/mL

## 2017-01-27 ENCOUNTER — Encounter: Payer: Self-pay | Admitting: Internal Medicine

## 2017-01-27 ENCOUNTER — Non-Acute Institutional Stay (SKILLED_NURSING_FACILITY): Payer: 59 | Admitting: Internal Medicine

## 2017-01-27 DIAGNOSIS — E512 Wernicke's encephalopathy: Secondary | ICD-10-CM | POA: Diagnosis not present

## 2017-01-27 DIAGNOSIS — F339 Major depressive disorder, recurrent, unspecified: Secondary | ICD-10-CM

## 2017-01-27 DIAGNOSIS — D638 Anemia in other chronic diseases classified elsewhere: Secondary | ICD-10-CM

## 2017-01-27 NOTE — Assessment & Plan Note (Addendum)
Trial of sertraline 25 mg daily with titration to 50 mg dailyif indicated

## 2017-01-27 NOTE — Progress Notes (Signed)
[facility] Room: 112  Patient, No Pcp Per    The patient is being discharged from [facility] on 01/28/17 by Marga Melnick MD.   The medical history in this facility was reviewed and summarized and medical problem list was updated. Time spent and note content is documented as follows.  Summary of [facility] medical records: The patient was admitted to SNF after hospitalization 4/18-4/24/18 for suspected Wernicke's encephalopathy with oculomotor dysfunction and gait ataxia. MRI findings seem to support that diagnosis.. The patient's mental status improved with IV thiamine but she had residual gait ataxia prompting placement in SNF for PT/OT. She exhibited significant anemia with hemoglobin of 7.8. Iron panel and B12 were normal.  Repeat  CBC at the SNF revealed hemoglobin 8.6 and hematocrit 27. She had elevated liver function test, AST peaked at 155 and ALT at 55. Prior to discharge the AST was 77 and ALT 45. The patient admits to 2 alcoholic beverages each night after returning from work. The patient was an active smoker prior to admission, she received a nicotine patch while at the facility. She seems interested in stopping smoking completely.  She understands the neuropsychiatric risk if she continues to drink. She plans to return to her home which has 3 front steps. There are no stairs inside the home. She doubts she'll need a wheelchair at home, but she plans to use the motorized scooter when she's in a store shopping.  Review of systems:Pertinent or active symptoms include: She describes fatiguing after 15 minutes and then will use a wheelchair. She has occasional exertional dyspnea. She also has minor dizziness. Her gait and strength have improved significantly since she came to the SNF. She did describe " feeling down or depressed sometimes" resulting in crying.  Negative ZOX:[id number]: No fever,significant weight change Eyes: No  redness, discharge, pain, vision change ENT/mouth: No nasal congestion,  purulent discharge, earache,change in hearing ,sore throat  Cardiovascular: No chest pain, palpitations,paroxysmal nocturnal dyspnea, claudication, edema  Respiratory: No cough, sputum production,hemoptysis, , significant snoring,apnea  Gastrointestinal: No heartburn,dysphagia,abdominal pain, nausea / vomiting,rectal bleeding, melena,change in bowels Genitourinary: No dysuria,hematuria, pyuria,  incontinence, nocturia Musculoskeletal: No joint stiffness, joint swelling, weakness,pain Dermatologic: No rash, pruritus, change in appearance of skin Neurologic: No headache,syncope Endocrine: No change in hair/skin/ nails, excessive thirst, excessive hunger, excessive urination  Hematologic/lymphatic: No significant bruising, lymphadenopathy,abnormal bleeding Allergy/immunology: No itchy/ watery eyes, significant sneezing, urticaria, angioedema  Physical exam:  Pertinent or positive findings: She has significant plaque formation of the teeth. Reflexes are 1.5 in the upper extremities but 0 at the knees. Pedal pulses are decreased but palpable. General appearance:Thin but adequately nourished; no acute distress , increased work of breathing is present.   Lymphatic: No lymphadenopathy about the head, neck, axilla . Eyes: No conjunctival inflammation or lid edema is present. There is no scleral icterus. Ears:  External ear exam shows no significant lesions or deformities.   Nose:  External nasal examination shows no deformity or inflammation. Nasal mucosa are pink and moist without lesions ,exudates Oral exam: lips and gums are healthy appearing.There is no oropharyngeal erythema or exudate . Neck:  No thyromegaly, masses, tenderness noted.    Heart:  Normal rate and regular rhythm. S1 and S2 normal without gallop, murmur, click, rub .  Lungs:Chest clear to auscultation without wheezes, rhonchi,rales , rubs. Abdomen:Bowel sounds  are normal. Abdomen is soft and nontender with no organomegaly, hernias,masses. GU: deferred as previously addressed. Extremities:  No cyanosis,  clubbing,edema  Neurologic exam : Strength equal  in upper & lower extremities Skin: Warm & dry w/o tenting. No significant lesions or rash.  See clinical summary of Discharge Diagnoses in the Problem List with associated updated therapeutic plan  Discharge instructions provided. Follow-up recommended with new primary care physician in 10- 14 days.

## 2017-01-27 NOTE — Assessment & Plan Note (Addendum)
01/27/17 patient oriented 3. There has been significant improvement in her mental status. Although significantly improved gait unsteadiness and slight dizziness remain as issues. Virginia Hess will be ordered for home use. She plans to use motorized scooters when out shopping in stores. She appears committed to addressing alcohol abuse issues. Continue thiamine, folic acid, and protein supplementation.

## 2017-01-28 NOTE — Patient Instructions (Signed)
See assessment and plan under each diagnosis in the problem list and acutely for this visit 

## 2017-01-28 NOTE — Assessment & Plan Note (Signed)
Improved on repeat CBC Continue folate & thiamine

## 2017-04-12 ENCOUNTER — Ambulatory Visit: Payer: 59 | Admitting: Neurology

## 2017-06-22 ENCOUNTER — Encounter (HOSPITAL_COMMUNITY): Payer: Self-pay | Admitting: Emergency Medicine

## 2017-06-22 DIAGNOSIS — J040 Acute laryngitis: Secondary | ICD-10-CM | POA: Diagnosis not present

## 2017-06-22 DIAGNOSIS — Z79899 Other long term (current) drug therapy: Secondary | ICD-10-CM | POA: Diagnosis not present

## 2017-06-22 DIAGNOSIS — R531 Weakness: Secondary | ICD-10-CM | POA: Diagnosis not present

## 2017-06-22 DIAGNOSIS — J029 Acute pharyngitis, unspecified: Secondary | ICD-10-CM | POA: Diagnosis present

## 2017-06-22 DIAGNOSIS — F17210 Nicotine dependence, cigarettes, uncomplicated: Secondary | ICD-10-CM | POA: Diagnosis not present

## 2017-06-22 NOTE — ED Triage Notes (Signed)
Family states she has been sick for about a month with cold like symptoms  Pt is c/o sore throat  Pt has a difficult time talking  Pt states when she gets up to do stuff she gets short of breath and her heart rate increases but when she sits down and rests she feels better  Pt ambulates with a walker

## 2017-06-23 ENCOUNTER — Emergency Department (HOSPITAL_COMMUNITY): Payer: 59

## 2017-06-23 ENCOUNTER — Emergency Department (HOSPITAL_COMMUNITY)
Admission: EM | Admit: 2017-06-23 | Discharge: 2017-06-23 | Disposition: A | Discharge status: Home / Self Care | Payer: 59 | Attending: Emergency Medicine | Admitting: Emergency Medicine

## 2017-06-23 DIAGNOSIS — J040 Acute laryngitis: Secondary | ICD-10-CM

## 2017-06-23 LAB — CBC WITH DIFFERENTIAL/PLATELET
Basophils Absolute: 0 10*3/uL (ref 0.0–0.1)
Basophils Relative: 0 %
Eosinophils Absolute: 0 10*3/uL (ref 0.0–0.7)
Eosinophils Relative: 0 %
HEMATOCRIT: 38.3 % (ref 36.0–46.0)
Hemoglobin: 13.3 g/dL (ref 12.0–15.0)
LYMPHS PCT: 45 %
Lymphs Abs: 4.2 10*3/uL — ABNORMAL HIGH (ref 0.7–4.0)
MCH: 31 pg (ref 26.0–34.0)
MCHC: 34.7 g/dL (ref 30.0–36.0)
MCV: 89.3 fL (ref 78.0–100.0)
MONO ABS: 0.5 10*3/uL (ref 0.1–1.0)
MONOS PCT: 6 %
NEUTROS ABS: 4.6 10*3/uL (ref 1.7–7.7)
Neutrophils Relative %: 49 %
Platelets: 309 10*3/uL (ref 150–400)
RBC: 4.29 MIL/uL (ref 3.87–5.11)
RDW: 15.3 % (ref 11.5–15.5)
WBC: 9.3 10*3/uL (ref 4.0–10.5)

## 2017-06-23 LAB — BASIC METABOLIC PANEL
Anion gap: 16 — ABNORMAL HIGH (ref 5–15)
BUN: 12 mg/dL (ref 6–20)
CALCIUM: 8.8 mg/dL — AB (ref 8.9–10.3)
CO2: 23 mmol/L (ref 22–32)
CREATININE: 0.75 mg/dL (ref 0.44–1.00)
Chloride: 96 mmol/L — ABNORMAL LOW (ref 101–111)
GFR calc Af Amer: >60 mL/min (ref >60)
GFR calc non Af Amer: >60 mL/min (ref >60)
GLUCOSE: 123 mg/dL — AB (ref 65–99)
Potassium: 3 mmol/L — ABNORMAL LOW (ref 3.5–5.1)
Sodium: 135 mmol/L (ref 135–145)

## 2017-06-23 LAB — RAPID STREP SCREEN (MED CTR MEBANE ONLY): Streptococcus, Group A Screen (Direct): NEGATIVE

## 2017-06-23 LAB — MONONUCLEOSIS SCREEN: Mono Screen: NEGATIVE

## 2017-06-23 MED ORDER — IOPAMIDOL (ISOVUE-300) INJECTION 61%
75.0000 mL | Freq: Once | INTRAVENOUS | Status: AC | PRN
  Administered 2017-06-23: 75 mL via INTRAVENOUS

## 2017-06-23 MED ORDER — POTASSIUM CHLORIDE CRYS ER 20 MEQ PO TBCR
40.0000 meq | EXTENDED_RELEASE_TABLET | Freq: Once | ORAL | Status: AC
  Administered 2017-06-23: 40 meq via ORAL
  Filled 2017-06-23: qty 2

## 2017-06-23 MED ORDER — AZITHROMYCIN 250 MG PO TABS: 250.0000 mg | ORAL_TABLET | Freq: Every day | ORAL | 0 refills | Status: DC

## 2017-06-23 MED ORDER — IOPAMIDOL (ISOVUE-300) INJECTION 61%
INTRAVENOUS | Status: AC
  Filled 2017-06-23: qty 75

## 2017-06-23 MED ORDER — PREDNISONE 20 MG PO TABS: 40.0000 mg | ORAL_TABLET | Freq: Every day | ORAL | 0 refills | Status: DC

## 2017-06-23 MED ORDER — MORPHINE SULFATE (PF) 4 MG/ML IV SOLN
4.0000 mg | Freq: Once | INTRAVENOUS | Status: DC
  Filled 2017-06-23: qty 1

## 2017-06-23 MED ORDER — SODIUM CHLORIDE 0.9 % IV BOLUS (SEPSIS)
1000.0000 mL | Freq: Once | INTRAVENOUS | Status: AC
  Administered 2017-06-23: 1000 mL via INTRAVENOUS

## 2017-06-23 MED ORDER — ONDANSETRON HCL 4 MG/2ML IJ SOLN
4.0000 mg | Freq: Once | INTRAMUSCULAR | Status: DC
Start: 2017-06-23 — End: 2017-06-23
  Filled 2017-06-23: qty 2

## 2017-06-23 NOTE — ED Provider Notes (Signed)
[facility] Note   CSN: [id number] Arrival date & time: 06/22/17  2057     History   Chief Complaint Chief Complaint  Patient presents with  . Weakness  . Sore Throat    HPI Virginia Hess is a 51 y.o. female.  Patient presents to the emergency department with chief complaint of fatigue and sore throat. She states that she has had a sore throat for about a month. She reports that she has no voice.She reports subjective fevers and chills, but has not measured a temperature home. She reports productive cough. She denies any nausea, vomiting, or diarrhea. Denies any other associated symptoms. There are no modifying factors.   The history is provided by the patient. No language interpreter was used.    Past Medical History:  Diagnosis Date  . Alcohol dependence (HCC)   . Gravida 4 para 4     Patient Active Problem List   Diagnosis Date Noted  . Cold intolerance 12/30/2016  . Wernicke's encephalopathy 12/22/2016  . Anemia of chronic disease 12/22/2016  . Alcohol dependence (HCC) 06/29/2016  . Hypokalemia 06/29/2016  . Hypomagnesemia 06/29/2016  . Lactic acid acidosis 06/29/2016  . Elevated LFTs 06/29/2016  . UTI (urinary tract infection) 06/29/2016  . Depression, recurrent (HCC) 06/29/2016    Past Surgical History:  Procedure Laterality Date  . NO PAST SURGERIES      OB History    No data available       Home Medications    Prior to Admission medications   Medication Sig Start Date End Date Taking? Authorizing Provider  acetaminophen (TYLENOL) 325 MG tablet Take 325 mg by mouth every 6 (six) hours as needed for moderate pain.    [provider]  albuterol (PROVENTIL HFA;VENTOLIN HFA) 108 (90 Base) MCG/ACT inhaler Inhale 2 puffs into the lungs every 4 (four) hours as needed for wheezing or shortness of breath. 07/05/16   Kathlen Mody, MD  feeding supplement, ENSURE ENLIVE, (ENSURE ENLIVE) LIQD Take 237  mLs by mouth 2 (two) times daily between meals. 07/05/16   Kathlen Mody, MD  folic acid (FOLVITE) 1 MG tablet Take 1 tablet (1 mg total) by mouth daily. 07/06/16   Kathlen Mody, MD  Multiple Vitamin (MULTIVITAMIN WITH MINERALS) TABS tablet Take 1 tablet by mouth daily. 07/06/16   Kathlen Mody, MD  nicotine (NICODERM CQ - DOSED IN MG/24 HR) 7 mg/24hr patch Place 1 patch (7 mg total) onto the skin daily. 12/28/16   Lenox Ponds, MD  thiamine 100 MG tablet Take 1 tablet (100 mg total) by mouth daily. 12/28/16   Lenox Ponds, MD    Family History Family History  Problem Relation Age of Onset  . Hypertension Brother   . Cancer Neg Hx   . Heart disease Neg Hx   . Stroke Neg Hx     Social History Social History  Substance Use Topics  . Smoking status: Current Every Day Smoker    Packs/day: 0.50    Years: 35.00    Types: Cigarettes  . Smokeless tobacco: Never Used  . Alcohol use Yes     Comment: 2 daily     Allergies   Patient has no known allergies.   Review of Systems Review of Systems  All other systems reviewed and are negative.    Physical Exam Updated Vital Signs BP (!) 176/125 (BP Location: Right Arm)   Pulse (!) 115   Temp 98.1 F (36.7 C) (Oral)  Resp 18   SpO2 96%   Physical Exam  Constitutional: She is oriented to person, place, and time. She appears well-developed and well-nourished.  HENT:  Head: Normocephalic and atraumatic.  No stridor, but abnormal phonation with high pitched squeaks, tolerating secretions  Eyes: Pupils are equal, round, and reactive to light. Conjunctivae and EOM are normal.  Neck: Normal range of motion. Neck supple.  Cardiovascular: Normal rate and regular rhythm.  Exam reveals no gallop and no friction rub.   No murmur heard. Pulmonary/Chest: Effort normal and breath sounds normal. No respiratory distress. She has no wheezes. She has no rales. She exhibits no tenderness.  Abdominal: Soft. Bowel sounds are normal.  She exhibits no distension and no mass. There is no tenderness. There is no rebound and no guarding.  Musculoskeletal: Normal range of motion. She exhibits no edema or tenderness.  Neurological: She is alert and oriented to person, place, and time.  Skin: Skin is warm and dry.  Psychiatric: She has a normal mood and affect. Her behavior is normal. Judgment and thought content normal.  Nursing note and vitals reviewed.    ED Treatments / Results  Labs (all labs ordered are listed, but only abnormal results are displayed) Labs Reviewed  CBC WITH DIFFERENTIAL/PLATELET - Abnormal; Notable for the following:       Result Value   Lymphs Abs 4.2 (*)    All other components within normal limits  BASIC METABOLIC PANEL - Abnormal; Notable for the following:    Potassium 3.0 (*)    Chloride 96 (*)    Glucose, Bld 123 (*)    Calcium 8.8 (*)    Anion gap 16 (*)    All other components within normal limits  RAPID STREP SCREEN (NOT [facility])  CULTURE, GROUP A STREP [facility])  MONONUCLEOSIS SCREEN    EKG  EKG Interpretation None       Radiology Dg Chest 2 View  Result Date: 06/23/2017 CLINICAL DATA:  Cough and cold symptoms for 1 month. Sore throat and difficulty talking. Weak and tired feeling. Smoker. EXAM: CHEST  2 VIEW COMPARISON:  12/23/2016 FINDINGS: The heart size and mediastinal contours are within normal limits. Both lungs are clear. The visualized skeletal structures are unremarkable. IMPRESSION: No active cardiopulmonary disease. Electronically Signed   By: Burman NievesWilliam  Stevens M.D.   On: 06/23/2017 02:02   Ct Soft Tissue Neck W Contrast  Result Date: 06/23/2017 CLINICAL DATA:  51 y/o F; sore throat, difficulty top, negative strep test, sick for 1 month. EXAM: CT NECK WITH CONTRAST TECHNIQUE: Multidetector CT imaging of the neck was performed using the standard protocol following the bolus administration of intravenous contrast. CONTRAST:  75mL ISOVUE-300 IOPAMIDOL (ISOVUE-300)  INJECTION 61% COMPARISON:  None. FINDINGS: Pharynx and larynx: Normal. No mass or swelling. Salivary glands: No inflammation, mass, or stone. Thyroid: Normal. Lymph nodes: None enlarged or abnormal density. Vascular: Negative. Limited intracranial: Negative. Visualized orbits: Negative. Mastoids and visualized paranasal sinuses: Clear. Skeleton: No acute or aggressive process. Upper chest: Negative. Other: None. IMPRESSION: No exophytic mass or mucosal thickening of the aerodigestive tract identified. Unremarkable CT of the neck. Electronically Signed   By: Mitzi HansenLance  Furusawa-Stratton M.D.   On: 06/23/2017 04:30    Procedures Procedures (including critical care time)  Medications Ordered in ED Medications - No data to display   Initial Impression / Assessment and Plan / ED Course  I have reviewed the triage vital signs and the nursing notes.  Pertinent labs & imaging results  that were available during my care of the patient were reviewed by me and considered in my medical decision making (see chart for details).     Patient with sore throat and fatigue for about a month.  She reports that she has lost her voice and speaks with very high pitched forceful squeaks, but is mostly unable to be understood.  She has no stridor or wheezing.  It is unclear whether or not the symptoms are as severe and the patient makes them out to be.  Will check CT soft tissue neck for further evaluation.  4:55 AM Patient reassessed.  Imaging is reassuring.  Will replete potassium.  Patient is actually improved in her speaking no that some family members have left.  Likely laryngitis.  DC to home.  Final Clinical Impressions(s) / ED Diagnoses   Final diagnoses:  Laryngitis    New Prescriptions New Prescriptions   AZITHROMYCIN (ZITHROMAX) 250 MG TABLET    Take 1 tablet (250 mg total) by mouth daily. Take first 2 tablets together, then 1 every day until finished.   PREDNISONE (DELTASONE) 20 MG TABLET    Take 2  tablets (40 mg total) by mouth daily.     Roxy Horseman, PA-C 06/23/17 0458    Paula Libra, MD 06/23/17 0700

## 2017-06-25 LAB — CULTURE, GROUP A STREP (THRC)

## 2017-07-05 ENCOUNTER — Other Ambulatory Visit: Payer: Self-pay | Admitting: Internal Medicine

## 2017-07-05 DIAGNOSIS — Z1231 Encounter for screening mammogram for malignant neoplasm of breast: Secondary | ICD-10-CM

## 2017-08-02 ENCOUNTER — Ambulatory Visit: Payer: 59

## 2020-04-14 ENCOUNTER — Encounter (HOSPITAL_COMMUNITY): Payer: Self-pay | Admitting: Emergency Medicine

## 2020-04-14 ENCOUNTER — Ambulatory Visit (HOSPITAL_COMMUNITY)
Admission: AD | Admit: 2020-04-14 | Discharge: 2020-04-14 | Disposition: A | Discharge status: Nursing Home (Includes ICF) | Payer: No Payment, Other

## 2020-04-14 ENCOUNTER — Other Ambulatory Visit: Payer: Self-pay

## 2020-04-14 ENCOUNTER — Emergency Department (HOSPITAL_COMMUNITY)
Admission: EM | Admit: 2020-04-14 | Discharge: 2020-04-15 | Disposition: A | Discharge status: Home / Self Care | Payer: 59 | Attending: Emergency Medicine | Admitting: Emergency Medicine

## 2020-04-14 DIAGNOSIS — R000 Tachycardia, unspecified: Secondary | ICD-10-CM | POA: Insufficient documentation

## 2020-04-14 DIAGNOSIS — I10 Essential (primary) hypertension: Secondary | ICD-10-CM | POA: Insufficient documentation

## 2020-04-14 DIAGNOSIS — R45851 Suicidal ideations: Secondary | ICD-10-CM | POA: Insufficient documentation

## 2020-04-14 DIAGNOSIS — Z5321 Procedure and treatment not carried out due to patient leaving prior to being seen by health care provider: Secondary | ICD-10-CM | POA: Insufficient documentation

## 2020-04-14 NOTE — ED Notes (Signed)
EMS called for transport per Annice Pih NP. Pt going to [facility] ED for medical clearance, is c/o HA. [facility] ED Charge [place] notified. Belongings sent with pt.

## 2020-04-14 NOTE — ED Triage Notes (Signed)
Patient arrived with EMS from behavior health unit reports SI with hypertension 177/105 and tachycardia 140's  this evening , no hallucinations. Marland Kitchen

## 2020-04-14 NOTE — ED Notes (Signed)
Patient items in locker #1

## 2020-04-15 ENCOUNTER — Encounter (HOSPITAL_COMMUNITY): Payer: Self-pay | Admitting: Emergency Medicine

## 2020-04-15 DIAGNOSIS — F17210 Nicotine dependence, cigarettes, uncomplicated: Secondary | ICD-10-CM | POA: Insufficient documentation

## 2020-04-15 DIAGNOSIS — R519 Headache, unspecified: Secondary | ICD-10-CM | POA: Insufficient documentation

## 2020-04-15 DIAGNOSIS — Z7951 Long term (current) use of inhaled steroids: Secondary | ICD-10-CM | POA: Insufficient documentation

## 2020-04-15 LAB — COMPREHENSIVE METABOLIC PANEL
ALT: 55 U/L — ABNORMAL HIGH (ref 0–44)
AST: 166 U/L — ABNORMAL HIGH (ref 15–41)
Albumin: 4.1 g/dL (ref 3.5–5.0)
Alkaline Phosphatase: 83 U/L (ref 38–126)
Anion gap: 16 — ABNORMAL HIGH (ref 5–15)
BUN: 6 mg/dL (ref 6–20)
CO2: 17 mmol/L — ABNORMAL LOW (ref 22–32)
Calcium: 9 mg/dL (ref 8.9–10.3)
Chloride: 99 mmol/L (ref 98–111)
Creatinine, Ser: 0.74 mg/dL (ref 0.44–1.00)
GFR calc Af Amer: >60 mL/min (ref >60)
GFR calc non Af Amer: >60 mL/min (ref >60)
Glucose, Bld: 107 mg/dL — ABNORMAL HIGH (ref 70–99)
Potassium: 3.4 mmol/L — ABNORMAL LOW (ref 3.5–5.1)
Sodium: 132 mmol/L — ABNORMAL LOW (ref 135–145)
Total Bilirubin: 0.5 mg/dL (ref 0.3–1.2)
Total Protein: 7.4 g/dL (ref 6.5–8.1)

## 2020-04-15 LAB — RAPID URINE DRUG SCREEN, HOSP PERFORMED
Amphetamines: NOT DETECTED
Barbiturates: NOT DETECTED
Benzodiazepines: NOT DETECTED
Cocaine: NOT DETECTED
Opiates: NOT DETECTED
Tetrahydrocannabinol: NOT DETECTED

## 2020-04-15 LAB — CBC
HCT: 39.3 % (ref 36.0–46.0)
Hemoglobin: 13.5 g/dL (ref 12.0–15.0)
MCH: 32.8 pg (ref 26.0–34.0)
MCHC: 34.4 g/dL (ref 30.0–36.0)
MCV: 95.6 fL (ref 80.0–100.0)
Platelets: 193 10*3/uL (ref 150–400)
RBC: 4.11 MIL/uL (ref 3.87–5.11)
RDW: 16 % — ABNORMAL HIGH (ref 11.5–15.5)
WBC: 7.4 10*3/uL (ref 4.0–10.5)
nRBC: 0 % (ref 0.0–0.2)

## 2020-04-15 LAB — I-STAT BETA HCG BLOOD, ED (MC, WL, AP ONLY): I-stat hCG, quantitative: <5 m[IU]/mL (ref <5)

## 2020-04-15 LAB — ACETAMINOPHEN LEVEL: Acetaminophen (Tylenol), Serum: <10 ug/mL — ABNORMAL LOW (ref 10–30)

## 2020-04-15 LAB — SALICYLATE LEVEL: Salicylate Lvl: <7 mg/dL — ABNORMAL LOW (ref 7.0–30.0)

## 2020-04-15 LAB — ETHANOL: Alcohol, Ethyl (B): 176 mg/dL — ABNORMAL HIGH (ref <10)

## 2020-04-15 NOTE — ED Triage Notes (Signed)
Per EMS, patient from home, seen at [facility] last night for headache and left AMA. C/o continued headache. Reports ETOH today. BP 170/90

## 2020-04-15 NOTE — ED Notes (Signed)
Pt stated she is not staying, nurse verified ok to leave not IVC, pt left

## 2020-04-16 ENCOUNTER — Emergency Department (HOSPITAL_COMMUNITY)
Admission: EM | Admit: 2020-04-16 | Discharge: 2020-04-16 | Disposition: A | Discharge status: Home / Self Care | Payer: Medicaid Other | Attending: Emergency Medicine | Admitting: Emergency Medicine

## 2020-04-16 DIAGNOSIS — R519 Headache, unspecified: Secondary | ICD-10-CM

## 2020-04-16 NOTE — ED Provider Notes (Signed)
[facility]-EMERGENCY DEPT Provider Note: Lowella Dell, MD, FACEP  CSN: [id number] MRN: [id number] ARRIVAL: 04/15/20 at 1240 ROOM: [facility]   CHIEF COMPLAINT  Headache   HISTORY OF PRESENT ILLNESS  04/16/20 3:31 AM Virginia Hess is a 54 y.o. female who was complaining of headache earlier which was moderate in severity.  She did not take anything for her headache.  She has subsequently been waiting in the waiting room for multiple hours during which time she took a nap.  She is now denying a headache.  She denies any other symptoms.  She did admit to drinking alcohol earlier.   Past Medical History:  Diagnosis Date  . Alcohol dependence (HCC)   . Gravida 4 para 4     Past Surgical History:  Procedure Laterality Date  . NO PAST SURGERIES      Family History  Problem Relation Age of Onset  . Hypertension Brother   . Cancer Neg Hx   . Heart disease Neg Hx   . Stroke Neg Hx     Social History   Tobacco Use  . Smoking status: Current Every Day Smoker    Packs/day: 0.50    Years: 35.00    Pack years: 17.50    Types: Cigarettes  . Smokeless tobacco: Never Used  Substance Use Topics  . Alcohol use: Yes    Comment: 2 daily  . Drug use: No    Prior to Admission medications   Medication Sig Start Date End Date Taking? Authorizing Provider  acetaminophen (TYLENOL) 325 MG tablet Take 650 mg by mouth every 6 (six) hours as needed for moderate pain.     [provider]  albuterol (PROVENTIL HFA;VENTOLIN HFA) 108 (90 Base) MCG/ACT inhaler Inhale 2 puffs into the lungs every 4 (four) hours as needed for wheezing or shortness of breath. 07/05/16   Kathlen Mody, MD  azithromycin (ZITHROMAX) 250 MG tablet Take 1 tablet (250 mg total) by mouth daily. Take first 2 tablets together, then 1 every day until finished. 06/23/17   Roxy Horseman, PA-C  folic acid (FOLVITE) 1 MG tablet Take 1 tablet (1 mg total) by mouth daily. 07/06/16   Kathlen Mody, MD  Multiple Vitamin  (MULTIVITAMIN WITH MINERALS) TABS tablet Take 1 tablet by mouth daily. 07/06/16   Kathlen Mody, MD  predniSONE (DELTASONE) 20 MG tablet Take 2 tablets (40 mg total) by mouth daily. 06/23/17   Roxy Horseman, PA-C  sertraline (ZOLOFT) 50 MG tablet Take 50 mg by mouth daily.  04/09/17   [provider]  thiamine 100 MG tablet Take 1 tablet (100 mg total) by mouth daily. 12/28/16   Lenox Ponds, MD    Allergies Patient has no known allergies.   REVIEW OF SYSTEMS  Negative except as noted here or in the History of Present Illness.   PHYSICAL EXAMINATION  Initial Vital Signs Blood pressure (!) 165/100, pulse (!) 110, temperature 98.4 F (36.9 C), resp. rate 20, height 5\' 4"  (1.626 m), weight 65.8 kg, SpO2 98 %.  Examination General: Well-developed, well-nourished female in no acute distress; appearance consistent with age of record HENT: normocephalic; atraumatic Eyes: pupils equal, round and reactive to light; extraocular muscles intact Neck: supple Heart: regular rate and rhythm Lungs: clear to auscultation bilaterally Abdomen: soft; nondistended; nontender; bowel sounds present Extremities: No deformity; full range of motion Neurologic: Awake, alert and oriented; motor function intact in all extremities and symmetric; no facial droop Skin: Warm and dry Psychiatric: Normal mood and affect  RESULTS  Summary of this visit's results, reviewed and interpreted by myself:   EKG Interpretation  Date/Time:    Ventricular Rate:    PR Interval:    QRS Duration:   QT Interval:    QTC Calculation:   R Axis:     Text Interpretation:        Laboratory Studies: No results found for this or any previous visit (from the past 24 hour(s)). Imaging Studies: No results found.  ED COURSE and MDM  Nursing notes, initial and subsequent vitals signs, including pulse oximetry, reviewed and interpreted by myself.  Vitals:   04/15/20 1252 04/15/20 1510 04/15/20 1728  04/16/20 0237  BP: (!) 143/86 (!) 175/96 (!) 150/119 (!) 165/100  Pulse: (!) 111 99 (!) 107 (!) 110  Resp: 16 17 20 20   Temp: 98.7 F (37.1 C) 98.6 F (37 C) 98.2 F (36.8 C) 98.4 F (36.9 C)  TempSrc: Oral     SpO2: 95% 96% 100% 98%  Weight:    65.8 kg  Height:    5\' 4"  (1.626 m)   Medications - No data to display  The patient has no current complaints at this time.  Her headache has resolved.  PROCEDURES  Procedures   ED DIAGNOSES     ICD-10-CM   1. Acute nonintractable headache, unspecified headache type  R51.9        Jelani Trueba, , MD 04/16/20 [phone]

## 2020-04-22 ENCOUNTER — Other Ambulatory Visit: Payer: Self-pay

## 2020-04-22 ENCOUNTER — Encounter (HOSPITAL_COMMUNITY): Payer: Self-pay

## 2020-04-22 ENCOUNTER — Emergency Department (HOSPITAL_COMMUNITY)
Admission: EM | Admit: 2020-04-22 | Discharge: 2020-04-24 | Disposition: A | Discharge status: Other Acute Inpatient Hospital | Payer: Medicaid Other | Attending: Emergency Medicine | Admitting: Emergency Medicine

## 2020-04-22 DIAGNOSIS — Z79899 Other long term (current) drug therapy: Secondary | ICD-10-CM | POA: Insufficient documentation

## 2020-04-22 DIAGNOSIS — Y929 Unspecified place or not applicable: Secondary | ICD-10-CM | POA: Insufficient documentation

## 2020-04-22 DIAGNOSIS — S61511A Laceration without foreign body of right wrist, initial encounter: Secondary | ICD-10-CM | POA: Insufficient documentation

## 2020-04-22 DIAGNOSIS — F17210 Nicotine dependence, cigarettes, uncomplicated: Secondary | ICD-10-CM | POA: Insufficient documentation

## 2020-04-22 DIAGNOSIS — R45851 Suicidal ideations: Secondary | ICD-10-CM | POA: Insufficient documentation

## 2020-04-22 DIAGNOSIS — S61512A Laceration without foreign body of left wrist, initial encounter: Secondary | ICD-10-CM | POA: Insufficient documentation

## 2020-04-22 DIAGNOSIS — T1491XA Suicide attempt, initial encounter: Secondary | ICD-10-CM | POA: Insufficient documentation

## 2020-04-22 DIAGNOSIS — Y9389 Activity, other specified: Secondary | ICD-10-CM | POA: Insufficient documentation

## 2020-04-22 DIAGNOSIS — W25XXXA Contact with sharp glass, initial encounter: Secondary | ICD-10-CM | POA: Insufficient documentation

## 2020-04-22 DIAGNOSIS — Y999 Unspecified external cause status: Secondary | ICD-10-CM | POA: Insufficient documentation

## 2020-04-22 DIAGNOSIS — E876 Hypokalemia: Secondary | ICD-10-CM | POA: Insufficient documentation

## 2020-04-22 DIAGNOSIS — Z20822 Contact with and (suspected) exposure to covid-19: Secondary | ICD-10-CM | POA: Insufficient documentation

## 2020-04-22 HISTORY — DX: Depression, unspecified: F32.A

## 2020-04-22 LAB — COMPREHENSIVE METABOLIC PANEL
ALT: 38 U/L (ref 0–44)
AST: 55 U/L — ABNORMAL HIGH (ref 15–41)
Albumin: 4 g/dL (ref 3.5–5.0)
Alkaline Phosphatase: 74 U/L (ref 38–126)
Anion gap: 15 (ref 5–15)
BUN: <5 mg/dL — ABNORMAL LOW (ref 6–20)
CO2: 21 mmol/L — ABNORMAL LOW (ref 22–32)
Calcium: 9.6 mg/dL (ref 8.9–10.3)
Chloride: 99 mmol/L (ref 98–111)
Creatinine, Ser: 0.65 mg/dL (ref 0.44–1.00)
GFR calc Af Amer: >60 mL/min (ref >60)
GFR calc non Af Amer: >60 mL/min (ref >60)
Glucose, Bld: 105 mg/dL — ABNORMAL HIGH (ref 70–99)
Potassium: 2.7 mmol/L — CL (ref 3.5–5.1)
Sodium: 135 mmol/L (ref 135–145)
Total Bilirubin: 0.9 mg/dL (ref 0.3–1.2)
Total Protein: 7.6 g/dL (ref 6.5–8.1)

## 2020-04-22 LAB — CBC
HCT: 40.3 % (ref 36.0–46.0)
Hemoglobin: 13.3 g/dL (ref 12.0–15.0)
MCH: 32.6 pg (ref 26.0–34.0)
MCHC: 33 g/dL (ref 30.0–36.0)
MCV: 98.8 fL (ref 80.0–100.0)
Platelets: 225 10*3/uL (ref 150–400)
RBC: 4.08 MIL/uL (ref 3.87–5.11)
RDW: 16.8 % — ABNORMAL HIGH (ref 11.5–15.5)
WBC: 7.5 10*3/uL (ref 4.0–10.5)
nRBC: 0 % (ref 0.0–0.2)

## 2020-04-22 LAB — RAPID URINE DRUG SCREEN, HOSP PERFORMED
Amphetamines: NOT DETECTED
Barbiturates: NOT DETECTED
Benzodiazepines: NOT DETECTED
Cocaine: NOT DETECTED
Opiates: NOT DETECTED
Tetrahydrocannabinol: NOT DETECTED

## 2020-04-22 LAB — SALICYLATE LEVEL: Salicylate Lvl: <7 mg/dL — ABNORMAL LOW (ref 7.0–30.0)

## 2020-04-22 LAB — ETHANOL: Alcohol, Ethyl (B): <10 mg/dL (ref <10)

## 2020-04-22 LAB — ACETAMINOPHEN LEVEL: Acetaminophen (Tylenol), Serum: <10 ug/mL — ABNORMAL LOW (ref 10–30)

## 2020-04-22 NOTE — ED Provider Notes (Signed)
[facility] EMERGENCY DEPARTMENT Provider Note   CSN: [id number] Arrival date & time: 04/22/20  1224     History No chief complaint on file.   Virginia Hess is a 54 y.o. female with a past medical history of depression, alcohol abuse presenting to the ED for suicide attempt.  States that just prior to arrival she tried to deliberately cut herself on her wrist and attempt to harm herself.  She denies suicide attempts in the past.  She does admit to increased stress in her personal life which led her to do this today.  She denies any attempted overdose.  She has never been on any antidepressant or antianxiety medications.  States that she does not drink alcohol daily.  Denies any drug use.  Denies any vomiting, diarrhea, changes to appetite but does note "sometimes I just don't want to eat because I can't taste it."  Denies chest pain, shortness of breath, fever, abdominal pain, HI, AVH. States she [place] up to date on tetanus.  HPI     Past Medical History:  Diagnosis Date  . Alcohol dependence (HCC)   . Depression   . Gravida 4 para 4     Patient Active Problem List   Diagnosis Date Noted  . Cold intolerance 12/30/2016  . Wernicke's encephalopathy 12/22/2016  . Anemia of chronic disease 12/22/2016  . Alcohol dependence (HCC) 06/29/2016  . Hypokalemia 06/29/2016  . Hypomagnesemia 06/29/2016  . Lactic acid acidosis 06/29/2016  . Elevated LFTs 06/29/2016  . UTI (urinary tract infection) 06/29/2016  . Depression, recurrent (HCC) 06/29/2016    Past Surgical History:  Procedure Laterality Date  . NO PAST SURGERIES       OB History   No obstetric history on file.     Family History  Problem Relation Age of Onset  . Hypertension Brother   . Cancer Neg Hx   . Heart disease Neg Hx   . Stroke Neg Hx     Social History   Tobacco Use  . Smoking status: Current Every Day Smoker    Packs/day: 0.50    Years: 35.00    Pack years: 17.50    Types:  Cigarettes  . Smokeless tobacco: Never Used  Substance Use Topics  . Alcohol use: Yes    Comment: 2 daily  . Drug use: No    Home Medications Prior to Admission medications   Medication Sig Start Date End Date Taking? Authorizing Provider  acetaminophen (TYLENOL) 325 MG tablet Take 650 mg by mouth every 6 (six) hours as needed for moderate pain.     [provider]  albuterol (PROVENTIL HFA;VENTOLIN HFA) 108 (90 Base) MCG/ACT inhaler Inhale 2 puffs into the lungs every 4 (four) hours as needed for wheezing or shortness of breath. 07/05/16   Kathlen Mody, MD  azithromycin (ZITHROMAX) 250 MG tablet Take 1 tablet (250 mg total) by mouth daily. Take first 2 tablets together, then 1 every day until finished. 06/23/17   Roxy Horseman, PA-C  folic acid (FOLVITE) 1 MG tablet Take 1 tablet (1 mg total) by mouth daily. 07/06/16   Kathlen Mody, MD  Multiple Vitamin (MULTIVITAMIN WITH MINERALS) TABS tablet Take 1 tablet by mouth daily. 07/06/16   Kathlen Mody, MD  predniSONE (DELTASONE) 20 MG tablet Take 2 tablets (40 mg total) by mouth daily. 06/23/17   Roxy Horseman, PA-C  sertraline (ZOLOFT) 50 MG tablet Take 50 mg by mouth daily.  04/09/17   [provider]  thiamine  100 MG tablet Take 1 tablet (100 mg total) by mouth daily. 12/28/16   Lenox Ponds, MD    Allergies    Patient has no known allergies.  Review of Systems   Review of Systems  Constitutional: Negative for appetite change, chills and fever.  HENT: Negative for ear pain, rhinorrhea, sneezing and sore throat.   Eyes: Negative for photophobia and visual disturbance.  Respiratory: Negative for cough, chest tightness, shortness of breath and wheezing.   Cardiovascular: Negative for chest pain and palpitations.  Gastrointestinal: Negative for abdominal pain, blood in stool, constipation, diarrhea, nausea and vomiting.  Genitourinary: Negative for dysuria, hematuria and urgency.  Musculoskeletal: Negative  for myalgias.  Skin: Positive for wound. Negative for rash.  Neurological: Negative for dizziness, weakness and light-headedness.  Psychiatric/Behavioral: Positive for self-injury and suicidal ideas. The patient is not nervous/anxious.     Physical Exam Updated Vital Signs BP (!) 142/84   Pulse 78   Temp 98.9 F (37.2 C) (Oral)   Resp 20   Ht 5\' 4"  (1.626 m)   Wt 65.8 kg   SpO2 100%   BMI 24.89 kg/m   Physical Exam Vitals and nursing note reviewed.  Constitutional:      General: She is not in acute distress.    Appearance: She is well-developed.  HENT:     Head: Normocephalic and atraumatic.     Nose: Nose normal.  Eyes:     General: No scleral icterus.       Left eye: No discharge.     Conjunctiva/sclera: Conjunctivae normal.  Cardiovascular:     Rate and Rhythm: Normal rate and regular rhythm.     Heart sounds: Normal heart sounds. No murmur heard.  No friction rub. No gallop.   Pulmonary:     Effort: Pulmonary effort is normal. No respiratory distress.     Breath sounds: Normal breath sounds.  Abdominal:     General: Bowel sounds are normal. There is no distension.     Palpations: Abdomen is soft.     Tenderness: There is no abdominal tenderness. There is no guarding.  Musculoskeletal:        General: Normal range of motion.     Cervical back: Normal range of motion and neck supple.  Skin:    General: Skin is warm and dry.     Findings: No rash.     Comments: Superficial lacerations to bilateral wrists and neck. No open or bleeding wounds noted.  Neurological:     Mental Status: She is alert.     Motor: No abnormal muscle tone.     Coordination: Coordination normal.  Psychiatric:        Thought Content: Thought content includes suicidal ideation. Thought content includes suicidal plan.     ED Results / Procedures / Treatments   Labs (all labs ordered are listed, but only abnormal results are displayed) Labs Reviewed  COMPREHENSIVE METABOLIC PANEL -  Abnormal; Notable for the following components:      Result Value   Potassium 2.7 (*)    CO2 21 (*)    Glucose, Bld 105 (*)    BUN <5 (*)    AST 55 (*)    All other components within normal limits  SALICYLATE LEVEL - Abnormal; Notable for the following components:   Salicylate Lvl <7.0 (*)    All other components within normal limits  ACETAMINOPHEN LEVEL - Abnormal; Notable for the following components:   Acetaminophen (Tylenol), Serum <10 (*)  All other components within normal limits  CBC - Abnormal; Notable for the following components:   RDW 16.8 (*)    All other components within normal limits  MAGNESIUM - Abnormal; Notable for the following components:   Magnesium 1.6 (*)    All other components within normal limits  SARS CORONAVIRUS 2 BY RT PCR (HOSPITAL ORDER, PERFORMED IN [facility])  ETHANOL  RAPID URINE DRUG SCREEN, HOSP PERFORMED  BASIC METABOLIC PANEL    EKG None  Radiology No results found.  Procedures Procedures (including critical care time)  Medications Ordered in ED Medications  potassium chloride 10 mEq in 100 mL IVPB (10 mEq Intravenous New Bag/Given 04/23/20 0507)  potassium chloride SA (KLOR-CON) CR tablet 40 mEq (has no administration in time range)  potassium chloride SA (KLOR-CON) CR tablet 40 mEq (40 mEq Oral Given 04/23/20 0141)  magnesium sulfate IVPB 2 g 50 mL (0 g Intravenous Stopped 04/23/20 0350)    ED Course  I have reviewed the triage vital signs and the nursing notes.  Pertinent labs & imaging results that were available during my care of the patient were reviewed by me and considered in my medical decision making (see chart for details).  Clinical Course as of Apr 23 542  Wed Apr 23, 2020  0310 Unable to obtain EKG for patient's hypokalemia.  Will have nurse complete this prior to medical clearance.   [HK]    Clinical Course User Index [HK] Dietrich Pates, PA-C   MDM Rules/Calculators/A&P                           54 year old female with a past medical history of depression, alcohol abuse presenting to the ED for suicide attempt.  She tried to cut herself on bilateral wrists and neck and attempt to harm herself.  No attempted overdoses.  States that she does not drink alcohol daily.  Does not appear to be hallucinating.  No suicide attempts prior.  She does not take any antianxiety or antidepressant medication.  Patient endorses SI with plan.  Denies vomiting or diarrhea but does report decreased appetite" not being able to taste anything."  Denies chest pain, shortness of breath, cough or fever.  Work appears significant for hypokalemia of 2.7, hypomagnesemia at 1.6.   Other medical screening lab work including acetaminophen level, salicylate level, CBC, ethanol level and UDS are unremarkable. EKG without any acute ischemic changes.  Patient awaiting TTS evaluation and disposition wihle being given IV potassium and magnesium. Will recheck BMP to ensure improvement in hypokalemia. She will be given PO potassium for further repletion.  TTS evaluation pending. Anticipate medical clearance if potassium level improved.   Portions of this note were generated with Scientist, clinical (histocompatibility and immunogenetics). Dictation errors may occur despite best attempts at proofreading.  Final Clinical Impression(s) / ED Diagnoses Final diagnoses:  Suicide attempt (HCC)  Hypokalemia  Hypomagnesemia    Rx / DC Orders ED Discharge Orders    None       Dietrich Pates, PA-C 04/23/20 0543    Shon Baton, MD 04/23/20 [phone]

## 2020-04-22 NOTE — ED Triage Notes (Signed)
Patient arrived by [facility] for suicide attempt. Patient broke picture frame and caused injury to self bilateral wrist and abrasion to neck. Alert and oriented, NAD. No active bleeding from wounds. Patient states that she has a lot of personal stress and was attempting to hurt herself.

## 2020-04-23 LAB — BASIC METABOLIC PANEL
Anion gap: 6 (ref 5–15)
BUN: 10 mg/dL (ref 6–20)
CO2: 22 mmol/L (ref 22–32)
Calcium: 8.6 mg/dL — ABNORMAL LOW (ref 8.9–10.3)
Chloride: 106 mmol/L (ref 98–111)
Creatinine, Ser: 0.66 mg/dL (ref 0.44–1.00)
GFR calc Af Amer: >60 mL/min (ref >60)
GFR calc non Af Amer: >60 mL/min (ref >60)
Glucose, Bld: 86 mg/dL (ref 70–99)
Potassium: 5.1 mmol/L (ref 3.5–5.1)
Sodium: 134 mmol/L — ABNORMAL LOW (ref 135–145)

## 2020-04-23 LAB — MAGNESIUM: Magnesium: 1.6 mg/dL — ABNORMAL LOW (ref 1.7–2.4)

## 2020-04-23 LAB — SARS CORONAVIRUS 2 BY RT PCR (HOSPITAL ORDER, PERFORMED IN ~~LOC~~ HOSPITAL LAB): SARS Coronavirus 2: NEGATIVE

## 2020-04-23 MED ORDER — POTASSIUM CHLORIDE 10 MEQ/100ML IV SOLN
10.0000 meq | INTRAVENOUS | Status: AC
  Administered 2020-04-23 (×3): 10 meq via INTRAVENOUS
  Filled 2020-04-23 (×3): qty 100

## 2020-04-23 MED ORDER — POTASSIUM CHLORIDE CRYS ER 20 MEQ PO TBCR
40.0000 meq | EXTENDED_RELEASE_TABLET | Freq: Once | ORAL | Status: AC
  Administered 2020-04-23: 40 meq via ORAL
  Filled 2020-04-23: qty 2

## 2020-04-23 MED ORDER — POTASSIUM CHLORIDE CRYS ER 20 MEQ PO TBCR
40.0000 meq | EXTENDED_RELEASE_TABLET | Freq: Every day | ORAL | Status: DC
  Administered 2020-04-24: 40 meq via ORAL
  Filled 2020-04-23: qty 2

## 2020-04-23 MED ORDER — MAGNESIUM SULFATE 2 GM/50ML IV SOLN
2.0000 g | Freq: Once | INTRAVENOUS | Status: AC
  Administered 2020-04-23: 2 g via INTRAVENOUS
  Filled 2020-04-23: qty 50

## 2020-04-23 NOTE — BH Assessment (Addendum)
Comprehensive Clinical Assessment (CCA) Note  04/23/2020 Virginia Hess [id number]   Patient is a 54 y.o. female with a significant history of untreated depression who presents via EMS to [facility] after calling this morning to report she attempted suicide last night.  Patient is calm, pleasant and cooperative upon assessment.  She states she has no interest in living any longer. She states she continues to have suicidal thoughts and, in this moment, is considering various suicide plans.  She states she has been dealing with family issues, primarily her four grown daughters taking advantage of her.  She shares that they started dropping off their kids with her after work on Fridays, with the expectation she would keep them all weekend.  This has been ongoing until she recently left her job due to medical reasons.  She doesn't give other examples, however states,"They always take advantage of me.  My kids and my brother."  She states she tried to take her life and continues to consider various suicide plans, as she is "tired of being used."  She is unable to identify any protective factors at this time, not even grandchildren.  She continues to state she is "just tired."  Patient has no past history of psychiatric treatment.  No past suicide attempts.  She denies HI and AVH.  Patient admits to history of alcohol use, however she states she has cut back.  She was hospitalized in 2018 for wernicke's encephelopathy and she was advised to discontinue alcohol use.  She has since cut back, however has not discontinued use.  She states she drinks 2 icehouse beers every other night to relax.    Patient does not provide collateral contact options.  She states she does not want her family involved at this time. She states they are not aware of her attempt or ER admission.  She feels they are not supportive and will only continue to take advantage of her.   Per Assunta Found, NP inpatient psychiatric treatment is  recommended. Patient is voluntary for treatment. SW to pursue appropriate inpatient options.    Visit Diagnosis:      ICD-10-CM   1. Suicide attempt (HCC)  T14.91XA   2. Hypokalemia  E87.6   3. Hypomagnesemia  E83.42       CCA Screening, Triage and Referral (STR)  Patient Reported Information How did you hear about [place]? Other (Comment) (EMS)  Referral name: Patient presents via EMS after calling them to report failed suicide attempt last ngiht.  Referral phone number: No data recorded  Whom do you see for routine medical problems? I don't have a doctor  Practice/Facility Name: No data recorded Practice/Facility Phone Number: No data recorded Name of Contact: No data recorded Contact Number: No data recorded Contact Fax Number: No data recorded Prescriber Name: No data recorded Prescriber Address (if known): No data recorded  What Is the Reason for Your Visit/Call Today? Patient called EMS this morning to report that she tried to kill herself last night by cutting her arms/neck with a piece of glass.  How Long Has This Been Causing You Problems? > than 6 months  What Do You Feel Would Help You the Most Today? Medication;Therapy   Have You Recently Been in Any Inpatient Treatment (Hospital/Detox/Crisis Center/28-Day Program)? No  Name/Location of Program/Hospital:No data recorded How Long Were You There? No data recorded When Were You Discharged? No data recorded  Have You Ever Received Services From [facility] Before? No  Who Do You See at  [facility]? No data recorded  Have You Recently Had Any Thoughts About Hurting Yourself? Yes  Are You Planning to Commit Suicide/Harm Yourself At This time? Yes   Have you Recently Had Thoughts About Hurting Someone Karolee Ohs? No  Explanation: No data recorded  Have You Used Any Alcohol or Drugs in the Past 24 Hours? Yes  How Long Ago Did You Use Drugs or Alcohol? No data recorded What Did You Use and How Much? 2 "light house"  beers   Do You Currently Have a Therapist/Psychiatrist? No  Name of Therapist/Psychiatrist: No data recorded  Have You Been Recently Discharged From Any Office Practice or Programs? No  Explanation of Discharge From Practice/Program: No data recorded    CCA Screening Triage Referral Assessment Type of Contact: Tele-Assessment  Is this Initial or Reassessment? Initial Assessment  Date Telepsych consult ordered in [facility]:  04/23/20  Time Telepsych consult ordered in [facility]:  0213   Patient Reported Information Reviewed? Yes  Patient Left Without Being Seen? No data recorded Reason for Not Completing Assessment: No data recorded  Collateral Involvement: N/A   Does Patient Have a Court Appointed Legal Guardian? No data recorded Name and Contact of Legal Guardian: No data recorded If Minor and Not Living with Parent(s), Who has Custody? No data recorded Is CPS involved or ever been involved? Never  Is APS involved or ever been involved? Never   Patient Determined To Be At Risk for Harm To Self or Others Based on Review of Patient Reported Information or Presenting Complaint? Yes, for Self-Harm  Method: No data recorded Availability of Means: No data recorded Intent: No data recorded Notification Required: No data recorded Additional Information for Danger to Others Potential: No data recorded Additional Comments for Danger to Others Potential: No data recorded Are There Guns or Other Weapons in Your Home? No data recorded Types of Guns/Weapons: No data recorded Are These Weapons Safely Secured?                            No data recorded Who Could Verify You Are Able To Have These Secured: No data recorded Do You Have any Outstanding Charges, Pending Court Dates, Parole/Probation? No data recorded Contacted To Inform of Risk of Harm To Self or Others: Unable to Contact:   Location of Assessment: [facility] ED   Does Patient Present under Involuntary Commitment? No  IVC Papers  Initial File Date: No data recorded  [place] of Residence: [place]   Patient Currently Receiving the Following Services: Not Receiving Services   Determination of Need: Emergent (2 hours)   Options For Referral: Inpatient Hospitalization     CCA Biopsychosocial  Intake/Chief Complaint:  CCA Intake With Chief Complaint CCA Part Two Date: 04/23/20 CCA Part Two Time: 1107 Chief Complaint/Presenting Problem: Patient presents via EMS after calling to report a suicide attempt last night.  She continues to endorse SI with intent. Patient's Currently Reported Symptoms/Problems: See above  Mental Health Symptoms Depression:  Depression: Change in energy/activity, Duration of symptoms greater than two weeks, Worthlessness, Tearfulness, Hopelessness  Mania:  Mania: None  Anxiety:   Anxiety: Fatigue  Psychosis:  Psychosis: None  Trauma:  Trauma: None  Obsessions:  Obsessions: None  Compulsions:  Compulsions: None  Inattention:  Inattention: None  Hyperactivity/Impulsivity:  Hyperactivity/Impulsivity: N/A  Oppositional/Defiant Behaviors:  Oppositional/Defiant Behaviors: N/A  Emotional Irregularity:  Emotional Irregularity: Chronic feelings of emptiness  Other Mood/Personality Symptoms:      Mental  Status Exam Appearance and self-care  Stature:  Stature: Average  Weight:  Weight: Average weight  Clothing:  Clothing: Casual  Grooming:  Grooming: Normal  Cosmetic use:  Cosmetic Use: Age appropriate  Posture/gait:  Posture/Gait: Slumped  Motor activity:  Motor Activity: Not Remarkable  Sensorium  Attention:  Attention: Normal  Concentration:  Concentration: Normal  Orientation:  Orientation: X5  Recall/memory:  Recall/Memory: Normal  Affect and Mood  Affect:  Affect: Depressed, Flat  Mood:  Mood: Depressed, Negative  Relating  Eye contact:  Eye Contact: Normal  Facial expression:  Facial Expression: Depressed  Attitude toward examiner:  Attitude Toward Examiner: Cooperative   Thought and Language  Speech flow: Speech Flow: Clear and Coherent  Thought content:  Thought Content: Appropriate to Mood and Circumstances  Preoccupation:  Preoccupations: Suicide  Hallucinations:  Hallucinations: None  Organization:     Company secretaryxecutive Functions  Fund of Knowledge:  Fund of Knowledge: Average  Intelligence:  Intelligence: Average  Abstraction:  Abstraction: Normal  Judgement:  Judgement: Impaired  Reality Testing:  Reality Testing: Variable  Insight:  Insight: Lacking  Decision Making:  Decision Making: Impulsive  Social Functioning  Social Maturity:  Social Maturity: Isolates  Social Judgement:  Social Judgement: Victimized  Stress  Stressors:  Stressors: Family conflict  Coping Ability:  Coping Ability: Horticulturist, commercialxhausted  Skill Deficits:  Skill Deficits: Scientist, physiologicalDecision making, Communication, Interpersonal  Supports:  Supports: Other (Comment) (No supports - states daughters take advantage of her)     Religion: Religion/Spirituality Are You A Religious Person?: No  Leisure/Recreation: Leisure / Recreation Do You Have Hobbies?: Yes Leisure and Hobbies: reading mysteries and romance novels  Exercise/Diet: Exercise/Diet Do You Exercise?: No Have You Gained or Lost A Significant Amount of Weight in the Past Six Months?: No Do You Follow a Special Diet?: No Do You Have Any Trouble Sleeping?: Yes Explanation of Sleeping Difficulties: restless some nights   CCA Employment/Education  Employment/Work Situation: Employment / Work Situation Employment situation: Biomedical scientistUnemployed Patient's job has been impacted by current illness: Yes Describe how patient's job has been impacted: Unable to work at [employer] due to medical reasons - in the process of filing for disability What is the longest time patient has a held a job?: Unknown Where was the patient employed at that time?: [employer] Has patient ever been in the Eli Lilly and Companymilitary?: No  Education: Education Is Patient Currently  Attending School?: No Did You Have An Individualized Education Program (IIEP): No Did You Have Any Difficulty At [employer] Energy[employer]?: No Patient's Education Has Been Impacted by Current Illness: No   CCA Family/Childhood History  Family and Relationship History: Family history Marital status: Single Does patient have children?: Yes How many children?: 4 How is patient's relationship with their children?: States her 4 daughters take advantage of her.  Childhood History:  Childhood History Did patient suffer any verbal/emotional/physical/sexual abuse as a child?: No Did patient suffer from severe childhood neglect?: No Has patient ever been sexually abused/assaulted/raped as an adolescent or adult?: No Was the patient ever a victim of a crime or a disaster?: No Witnessed domestic violence?: No Has patient been affected by domestic violence as an adult?: No  Child/Adolescent Assessment:     CCA Substance Use  Alcohol/Drug Use: Alcohol / Drug Use Pain Medications: See MAR Prescriptions: See MAR Over the Counter: See MAR History of alcohol / drug use?: Yes Longest period of sobriety (when/how long): N/A Substance #1 Name of Substance 1: ETOH 1 - Age of First Use: 89s  1 - Amount (size/oz): 2 ice house beers 1 - Frequency: 3-4 evenings / wk 1 - Duration: Years 1 - Last Use / Amount: 2 days ago - 2 beers     ASAM's:  Six Dimensions of Multidimensional Assessment  Dimension 1:  Acute Intoxication and/or Withdrawal Potential:      Dimension 2:  Biomedical Conditions and Complications:      Dimension 3:  Emotional, Behavioral, or Cognitive Conditions and Complications:     Dimension 4:  Readiness to Change:     Dimension 5:  Relapse, Continued use, or Continued Problem Potential:     Dimension 6:  Recovery/Living Environment:     ASAM Severity Score:    ASAM Recommended Level of Treatment: ASAM Recommended Level of Treatment: Level I Outpatient Treatment   Substance use Disorder  (SUD) Substance Use Disorder (SUD)  Checklist Symptoms of Substance Use: Continued use despite having a persistent/recurrent physical/psychological problem caused/exacerbated by use  Recommendations for Services/Supports/Treatments: Recommendations for Services/Supports/Treatments Recommendations For Services/Supports/Treatments: Individual Therapy  DSM5 Diagnoses: Patient Active Problem List   Diagnosis Date Noted  . Cold intolerance 12/30/2016  . Wernicke's encephalopathy 12/22/2016  . Anemia of chronic disease 12/22/2016  . Alcohol dependence (HCC) 06/29/2016  . Hypokalemia 06/29/2016  . Hypomagnesemia 06/29/2016  . Lactic acid acidosis 06/29/2016  . Elevated LFTs 06/29/2016  . UTI (urinary tract infection) 06/29/2016  . Depression, recurrent (HCC) 06/29/2016    Patient Centered Plan: Patient is on the following Treatment Plan(s):  Depression   Referrals to Alternative Service(s): Inpatient treatment is recommended.  Yetta Glassman, [facility]

## 2020-04-23 NOTE — Progress Notes (Signed)
Pt meets inpatient criteria per Assunta Found, NP. Referral information has been sent to the following hospitals for review:  [facility]  [facility]  [facility]  [facility]  [facility]  [facility]  [facility]  [facility]     Disposition will continue to follow for inpatient placement needs.    Wells Guiles, LCSW, LCAS Disposition CSW [facility]/TTS [phone]

## 2020-04-23 NOTE — ED Notes (Addendum)
Unable to obtain EKG. No portable EKG monitor available. Report given to next RN. Will attempt other solution

## 2020-04-23 NOTE — ED Notes (Signed)
Breakfast Ordered 

## 2020-04-24 ENCOUNTER — Other Ambulatory Visit: Payer: Self-pay

## 2020-04-24 ENCOUNTER — Inpatient Hospital Stay (HOSPITAL_COMMUNITY)
Admission: AD | Admit: 2020-04-24 | Discharge: 2020-05-06 | DRG: 885 | Disposition: A | Discharge status: Home / Self Care | Payer: Federal, State, Local not specified - Other | Source: Intra-hospital | Attending: Psychiatry | Admitting: Psychiatry

## 2020-04-24 ENCOUNTER — Encounter (HOSPITAL_COMMUNITY): Payer: Self-pay

## 2020-04-24 DIAGNOSIS — F1020 Alcohol dependence, uncomplicated: Secondary | ICD-10-CM | POA: Diagnosis present

## 2020-04-24 DIAGNOSIS — Z79899 Other long term (current) drug therapy: Secondary | ICD-10-CM

## 2020-04-24 DIAGNOSIS — F17210 Nicotine dependence, cigarettes, uncomplicated: Secondary | ICD-10-CM | POA: Diagnosis present

## 2020-04-24 DIAGNOSIS — I10 Essential (primary) hypertension: Secondary | ICD-10-CM | POA: Diagnosis not present

## 2020-04-24 DIAGNOSIS — F1024 Alcohol dependence with alcohol-induced mood disorder: Secondary | ICD-10-CM | POA: Diagnosis not present

## 2020-04-24 DIAGNOSIS — E872 Acidosis, unspecified: Secondary | ICD-10-CM | POA: Diagnosis present

## 2020-04-24 DIAGNOSIS — E512 Wernicke's encephalopathy: Secondary | ICD-10-CM | POA: Diagnosis present

## 2020-04-24 DIAGNOSIS — G4700 Insomnia, unspecified: Secondary | ICD-10-CM | POA: Diagnosis present

## 2020-04-24 DIAGNOSIS — R7989 Other specified abnormal findings of blood chemistry: Secondary | ICD-10-CM | POA: Diagnosis present

## 2020-04-24 DIAGNOSIS — F419 Anxiety disorder, unspecified: Secondary | ICD-10-CM | POA: Diagnosis present

## 2020-04-24 DIAGNOSIS — N390 Urinary tract infection, site not specified: Secondary | ICD-10-CM | POA: Diagnosis present

## 2020-04-24 DIAGNOSIS — E876 Hypokalemia: Secondary | ICD-10-CM | POA: Diagnosis present

## 2020-04-24 DIAGNOSIS — X780XXA Intentional self-harm by sharp glass, initial encounter: Secondary | ICD-10-CM | POA: Diagnosis present

## 2020-04-24 DIAGNOSIS — D638 Anemia in other chronic diseases classified elsewhere: Secondary | ICD-10-CM | POA: Diagnosis present

## 2020-04-24 DIAGNOSIS — J449 Chronic obstructive pulmonary disease, unspecified: Secondary | ICD-10-CM | POA: Diagnosis present

## 2020-04-24 DIAGNOSIS — R45851 Suicidal ideations: Secondary | ICD-10-CM

## 2020-04-24 DIAGNOSIS — F332 Major depressive disorder, recurrent severe without psychotic features: Principal | ICD-10-CM

## 2020-04-24 LAB — BASIC METABOLIC PANEL
Anion gap: 8 (ref 5–15)
BUN: 9 mg/dL (ref 6–20)
CO2: 22 mmol/L (ref 22–32)
Calcium: 8.6 mg/dL — ABNORMAL LOW (ref 8.9–10.3)
Chloride: 108 mmol/L (ref 98–111)
Creatinine, Ser: 0.84 mg/dL (ref 0.44–1.00)
GFR calc Af Amer: >60 mL/min (ref >60)
GFR calc non Af Amer: >60 mL/min (ref >60)
Glucose, Bld: 89 mg/dL (ref 70–99)
Potassium: 3.6 mmol/L (ref 3.5–5.1)
Sodium: 138 mmol/L (ref 135–145)

## 2020-04-24 MED ORDER — LORAZEPAM 1 MG PO TABS: 0.0000 mg | ORAL_TABLET | Freq: Two times a day (BID) | ORAL | Status: DC

## 2020-04-24 MED ORDER — ALUM & MAG HYDROXIDE-SIMETH 200-200-20 MG/5ML PO SUSP: 30.0000 mL | ORAL | Status: DC | PRN

## 2020-04-24 MED ORDER — LORAZEPAM 1 MG PO TABS: 0.0000 mg | ORAL_TABLET | Freq: Four times a day (QID) | ORAL | Status: DC

## 2020-04-24 MED ORDER — FOLIC ACID 1 MG PO TABS
1.0000 mg | ORAL_TABLET | Freq: Every day | ORAL | Status: DC
  Administered 2020-04-25 – 2020-05-06 (×12): 1 mg via ORAL
  Filled 2020-04-24 (×14): qty 1

## 2020-04-24 MED ORDER — TRAZODONE HCL 50 MG PO TABS: 50.0000 mg | ORAL_TABLET | Freq: Every evening | ORAL | Status: DC | PRN

## 2020-04-24 MED ORDER — THIAMINE HCL 100 MG PO TABS: 100.0000 mg | ORAL_TABLET | Freq: Every day | ORAL | Status: DC

## 2020-04-24 MED ORDER — SERTRALINE HCL 50 MG PO TABS
50.0000 mg | ORAL_TABLET | Freq: Every day | ORAL | Status: DC
  Administered 2020-04-25: 50 mg via ORAL
  Filled 2020-04-24 (×3): qty 1

## 2020-04-24 MED ORDER — THIAMINE HCL 100 MG PO TABS
100.0000 mg | ORAL_TABLET | Freq: Every day | ORAL | Status: DC
  Administered 2020-04-25 – 2020-04-26 (×2): 100 mg via ORAL
  Filled 2020-04-24 (×5): qty 1

## 2020-04-24 MED ORDER — ACETAMINOPHEN 325 MG PO TABS
650.0000 mg | ORAL_TABLET | Freq: Four times a day (QID) | ORAL | Status: DC | PRN
  Administered 2020-04-25 – 2020-05-06 (×7): 650 mg via ORAL
  Filled 2020-04-24 (×7): qty 2

## 2020-04-24 MED ORDER — THIAMINE HCL 100 MG/ML IJ SOLN: 100.0000 mg | Freq: Every day | INTRAMUSCULAR | Status: DC

## 2020-04-24 MED ORDER — MAGNESIUM HYDROXIDE 400 MG/5ML PO SUSP: 30.0000 mL | Freq: Every day | ORAL | Status: DC | PRN

## 2020-04-24 MED ORDER — LORAZEPAM 2 MG/ML IJ SOLN: 0.0000 mg | Freq: Four times a day (QID) | INTRAMUSCULAR | Status: DC

## 2020-04-24 MED ORDER — LORAZEPAM 2 MG/ML IJ SOLN: 0.0000 mg | Freq: Two times a day (BID) | INTRAMUSCULAR | Status: DC

## 2020-04-24 MED ORDER — HYDROXYZINE HCL 25 MG PO TABS
25.0000 mg | ORAL_TABLET | Freq: Three times a day (TID) | ORAL | Status: DC | PRN
  Administered 2020-04-28 – 2020-05-04 (×5): 25 mg via ORAL
  Filled 2020-04-24 (×4): qty 1
  Filled 2020-04-24: qty 10
  Filled 2020-04-24 (×2): qty 1

## 2020-04-24 MED ORDER — LORAZEPAM 1 MG PO TABS: 1.0000 mg | ORAL_TABLET | Freq: Four times a day (QID) | ORAL | Status: DC | PRN

## 2020-04-24 NOTE — ED Notes (Signed)
Boneta Lucks called from [facility] @ [phone] to see if patient still needs placement--Stephane Niemann

## 2020-04-24 NOTE — Progress Notes (Signed)
[facility] Group Notes:  (Nursing/MHT/Case Management/Adjunct)  Date:  04/24/2020  Time:  2030  Type of Therapy:  wrap up group  Participation Level:  Active  Participation Quality:  Appropriate, Attentive, Sharing and Supportive  Affect:  Depressed  Cognitive:  Appropriate  Insight:  Improving  Engagement in Group:  Engaged  Modes of Intervention:  Clarification, Education and Support  Summary of Progress/Problems: Positive thinking and positive change were discussed. Pt reports feeling good about waking up to see another day. Pt plans on not having contact with her family after discharge. Pt enjoyed meeting and socializing with other patients today.   Marcille Buffy 04/24/2020, 9:15 PM

## 2020-04-24 NOTE — Progress Notes (Signed)
Pt accepted to [facility], bed 300    Reola Calkins, NP is the accepting provider  Dr. Tamera Punt is the accepting provider  Call report to [phone]    [facility] @ [facility] ED has been notified   Pt is voluntary and will be transported by [employer]  Pt is scheduled to arrive at [facility] at 430pm.   Wells Guiles, LCSW, LCAS Disposition CSW [facility] BHH/TTS [phone]

## 2020-04-24 NOTE — ED Notes (Signed)
[facility] transporting pt to [facility] - ALL belongings - 1 labeled belongings bag - [facility] - Pt aware - Pt changed into blue paper scrubs.

## 2020-04-24 NOTE — ED Notes (Signed)
Pt ambulatory w/walker to restroom and back to room. Pt noted to be calm, cooperative, pleasant.

## 2020-04-24 NOTE — ED Provider Notes (Signed)
Emergency Medicine Observation Re-evaluation Note  Virginia Hess is a 54 y.o. female, seen on rounds today.  Pt initially presented to the ED for complaints of Suicidal Currently, the patient is laying in bed, states that she plans to take a nap after lunch.   Physical Exam  BP 119/73 (BP Location: Right Arm)   Pulse 72   Temp 97.9 F (36.6 C) (Oral)   Resp 18   Ht 5\' 4"  (1.626 m)   Wt 65.8 kg   SpO2 100%   BMI 24.89 kg/m  Physical Exam General: laying in bed calm and cooperative Cardiac: normal rate Lungs: respirations clear and unlabored Psych: calm and cooperative  ED Course / MDM  EKG:  Clinical Course as of Apr 25 1199  Wed Apr 23, 2020  0310 Unable to obtain EKG for patient's hypokalemia.  Will have nurse complete this prior to medical clearance.   [HK]    Clinical Course User Index [HK] Khatri, Hina, PA-C   I have reviewed the labs performed to date as well as medications administered while in observation.  Recent changes in the last 24 hours include none SW still seeking psych placement.   Plan  Current plan is for intpatient. Patient is under full IVC at this time.   0311, PA-C 04/24/20 1307    04/26/20, MD 04/27/20 [phone]

## 2020-04-24 NOTE — ED Notes (Signed)
Ordered [place] °

## 2020-04-24 NOTE — Tx Team (Signed)
Initial Treatment Plan 04/24/2020 7:00 PM GERRE RANUM YJW:[id number]    PATIENT STRESSORS: Financial difficulties Health problems Marital or family conflict   PATIENT STRENGTHS: Ability for insight Communication skills Motivation for treatment/growth   PATIENT IDENTIFIED PROBLEMS: "I am just tired of life"  SI and Depression  "I don't deal with family, friends or people"                 DISCHARGE CRITERIA:  Ability to meet basic life and health needs Improved stabilization in mood, thinking, and/or behavior Motivation to continue treatment in a less acute level of care Reduction of life-threatening or endangering symptoms to within safe limits Verbal commitment to aftercare and medication compliance  PRELIMINARY DISCHARGE PLAN: Attend aftercare/continuing care group Return to previous living arrangement  PATIENT/FAMILY INVOLVEMENT: This treatment plan has been presented to and reviewed with the patient, Virginia Hess.  The patient and family have been given the opportunity to ask questions and make suggestions.  Margarita Rana, RN 04/24/2020, 7:00 PM

## 2020-04-24 NOTE — BH Assessment (Signed)
Pt presents as Voluntary status C/O "struggling with ongoing depression" for a long time stated that the depression has been worse in the past two weeks and she felt that "I cannot do it anymore. I am just tired of being used by my 4 grown daughters, but when I want help no one is ever there for me" "They drop off the kids when they want and I have to take care of them by myself while they run around the streets; I am just tired of life, I don't deal with friends I don't deal with people I got taken advantage of too many times".  She went on to say that what really pushed her to the edge was one of her daughters "has a drug dealer boyfriend and she has been helping her boyfriend to drop of package with the baby in the car and it has been worrying me a lot. I tried to talk to her and she told me to get out of her business" Virginia Hess went on to say she has called police on two different occasions and told them about this issue of her daughter dropping packages with the children in the car.   She endorses Passive SI denies A/VH, she verbally contracted for safety. Virginia Hess is very Paranoid with Depressed affect and Guarded interaction. She has family history of SI her sister tried to overdose with medication. She also mentioned finances as one of her stressors as she is currently unemployed due to a back and bilateral leg work injury, she could not remember the diagnosis. She has unsteady gait and uses walk for mobility. She drinks 2 beers every other night but said she has not been drinking for the past week. She refused to sign visitation or telephone consent stating that she does not want to talk to any of her family or friends.   Skin assessment completed noted to have superficial cuts on neck and left wrist where she was cutting herself with glass. Emotional support and availability offered to Patient as needed.Belongings searched per protocol. Unit orientation and routine discussed, Care Plan reviewed as  well and Patient verbalized understanding. Fluids and food offered, tolerated well. Q15 minutes safety checks initiated without self harm gestures.

## 2020-04-24 NOTE — Progress Notes (Addendum)
Pt is being reviewed for admission at [facility].   Mannie Stabile provider, Dr Sherryle Lis requests that pt be placed under IVC prior to acceptance being given.   Becky @ [facility] ED will discuss with [facility] ED provider.  Wells Guiles, LCSW, LCAS Disposition CSW [facility] BHH/TTS [phone]

## 2020-04-25 DIAGNOSIS — E876 Hypokalemia: Secondary | ICD-10-CM

## 2020-04-25 DIAGNOSIS — F1024 Alcohol dependence with alcohol-induced mood disorder: Secondary | ICD-10-CM

## 2020-04-25 DIAGNOSIS — D638 Anemia in other chronic diseases classified elsewhere: Secondary | ICD-10-CM

## 2020-04-25 DIAGNOSIS — R45851 Suicidal ideations: Secondary | ICD-10-CM

## 2020-04-25 DIAGNOSIS — E512 Wernicke's encephalopathy: Secondary | ICD-10-CM

## 2020-04-25 DIAGNOSIS — F332 Major depressive disorder, recurrent severe without psychotic features: Secondary | ICD-10-CM

## 2020-04-25 DIAGNOSIS — E872 Acidosis: Secondary | ICD-10-CM

## 2020-04-25 DIAGNOSIS — F1020 Alcohol dependence, uncomplicated: Secondary | ICD-10-CM

## 2020-04-25 DIAGNOSIS — R7989 Other specified abnormal findings of blood chemistry: Secondary | ICD-10-CM

## 2020-04-25 LAB — TSH: TSH: 1.295 u[IU]/mL (ref 0.350–4.500)

## 2020-04-25 MED ORDER — ALBUTEROL SULFATE HFA 108 (90 BASE) MCG/ACT IN AERS: 2.0000 | INHALATION_SPRAY | RESPIRATORY_TRACT | Status: DC | PRN

## 2020-04-25 MED ORDER — SERTRALINE HCL 100 MG PO TABS
100.0000 mg | ORAL_TABLET | Freq: Every day | ORAL | Status: DC
  Administered 2020-04-26 – 2020-04-27 (×2): 100 mg via ORAL
  Filled 2020-04-25 (×4): qty 1

## 2020-04-25 MED ORDER — SERTRALINE HCL 50 MG PO TABS
50.0000 mg | ORAL_TABLET | Freq: Every day | ORAL | Status: AC
  Administered 2020-04-25: 50 mg via ORAL
  Filled 2020-04-25: qty 1

## 2020-04-25 NOTE — BHH Suicide Risk Assessment (Signed)
[facility]   Nursing information obtained from:  Patient Demographic factors:  Unemployed, Divorced or widowed, Living alone Current Mental Status:  Suicidal ideation indicated by patient Loss Factors:  Financial problems / change in socioeconomic status, Loss of significant relationship Historical Factors:  Family history of suicide, Impulsivity Risk Reduction Factors:  Positive coping skills or problem solving skills  Total Time spent with patient: 1 hour Principal Problem: Suicidal ideation Diagnosis:  Principal Problem:   Suicidal ideation Active Problems:   Alcohol dependence (HCC)   Hypokalemia   Hypomagnesemia   Lactic acid acidosis   Elevated LFTs   Wernicke's encephalopathy   Anemia of chronic disease   Major depressive disorder, recurrent episode, severe (HCC)  Subjective Data: "I don't want to be alive anymore" History of Present Illness: Patient is a 54 y.o. female with no past psychiatric history who [facility] calling in the morning after she attempted suicide. Patient broke picture frame and caused injury to self bilateral wrist and abrasion to neck. She admitted to increased stress in her personal life which led her to her suicidal attempt. At initial clinical assessment with counselor she stated she has been dealing with family issues, primarily her four grown daughters taking advantage of her. She shares that they started dropping off their kids with her after work on Fridays, with the expectation she would keep them all weekend. This has been ongoing until she recently left her job due to medical reasons. She stated she is "tired of being used." She denied any drug use. Patient admitted to history of alcohol use, however she stated she had cut back. She was hospitalized in 2018 for wernicke's encephalopathy and she was advised to discontinue alcohol use. She has since cut back, however has not discontinued use.   Pt is seen and examined today. Pt states she doesn't want to be seen as there is no use, she is done with her life and now she just wants to end it". She states "I am so tired of this life, I  just want to end it". When asked about recent stressors she just says " I am just tired". When asked about her family, Pt gets irritable and guarded and she stated she doesn't want to speak about it. Pt states "you can give me medication but I don't have to take it when I go home, Its my life". Pt states she has been depressed for a while. She endorses hopelessness, helplessness, worthlessness, fatigue, decreased sleep, decreased energy, anhedonia and poor appetite but denies any guilt. Pt endorses suicidal ideations states she feels like hurting herself everyday. She states sometimes she feels like walking up the bridge and jumping over. Pt states she has problem with staying asleep and wakes up, watch TV and go back to sleep again. Pt states she was feeling good and had lots of energy when she was raising her kids. Patient has no past history of psychiatric treatment. She denies past suicide attempts.  She has never been on any antidepressant or antianxiety medications. Denies any drug use. She denies HI and auditory and visual hallucinations. Pt denies paranoia. Pt doesn't want to give any collateral contact options.  Pt denies any medical illnesses. Pt is unemployed and lives alone.  On examination, Pt is irritable, depressed, cooperative and oriented x3. Pt speech is slow with decreased volume. Pt's Mood is depressed, hopeless, irritable and worthless and affect is Flat. Her thought process is tangential, focused  on SI. Pt is not espousing to any internal stimuli. Endorses SI with a plan (Sometimes thinks of jumping from a bridge) , Denies HI , denies AVH. Pt uses a walker.  Associated Signs/Symptoms: Depression Symptoms:  depressed mood, anhedonia, insomnia, fatigue, feelings of  worthlessness/guilt, hopelessness, suicidal thoughts without plan, loss of energy/fatigue, decreased appetite, (Hypo) Manic Symptoms:  Impulsivity, Irritable Mood, Anxiety Symptoms:  None Psychotic Symptoms:  Delusions, Hallucinations: None PTSD Symptoms: Negative   Past Psychiatric History: None reported  Is the patient at risk to self? Yes.    Has the patient been a risk to self in the past 6 months? No.  Has the patient been a risk to self within the distant past? No.  Is the patient a risk to others? No.  Has the patient been a risk to others in the past 6 months? No.  Has the patient been a risk to others within the distant past? No.   Prior Inpatient Therapy:  denies Prior Outpatient Therapy:  denies   Continued Clinical Symptoms:  Alcohol Use Disorder Identification Test Final Score (AUDIT): 7 The "Alcohol Use Disorders Identification Test", Guidelines for Use in Primary Care, Second Edition.  World Science writer [facility]). Score between 0-7:  no or low risk or alcohol related problems. Score between 8-15:  moderate risk of alcohol related problems. Score between 16-19:  high risk of alcohol related problems. Score 20 or above:  warrants further diagnostic evaluation for alcohol dependence and treatment.   CLINICAL FACTORS:   Depression:   Anhedonia Comorbid alcohol abuse/dependence Hopelessness Insomnia Severe  Musculoskeletal: Strength & Muscle Tone: within normal limits Gait & Station: unsteady, Pt uses a walker Patient leans: N/A  Psychiatric Specialty Exam: Physical Exam Vitals and nursing note reviewed.  Constitutional:      General: She is not in acute distress.    Appearance: Normal appearance. She is not ill-appearing, toxic-appearing or diaphoretic.  HENT:     Head: Normocephalic and atraumatic.  Pulmonary:     Effort: Pulmonary effort is normal.  Neurological:     Mental Status: She is alert.     Review of Systems  Constitutional:  Positive for activity change and fatigue. Negative for fever.  Gastrointestinal: Negative for abdominal pain, constipation, diarrhea, nausea and vomiting.  Psychiatric/Behavioral: Positive for dysphoric mood, sleep disturbance and suicidal ideas. Negative for hallucinations. The patient is not hyperactive.     Blood pressure 119/65, pulse 73, temperature 97.9 F (36.6 C), temperature source Oral, resp. rate 18, height 5\' 4"  (1.626 m), weight 65.8 kg, SpO2 100 %.Body mass index is 24.9 kg/m.  General Appearance: Casual  Eye Contact:  Fair  Speech:  Slow  Volume:  Decreased  Mood:  Depressed, Hopeless, Irritable and Worthless  Affect:  Flat  Thought Process:  Descriptions of Associations: Tangential  Orientation:  Full (Time, Place, and Person)  Thought Content:  Delusions and Hallucinations: None  Suicidal Thoughts:  Yes.  with intent/plan (Sometimes thinks of jumping from a bridge)   Homicidal Thoughts:  No  Memory:  Immediate;   Fair Recent;   Fair Remote;   Fair  Judgement:  Impaired  Insight:  Lacking  Psychomotor Activity:  Decreased  Concentration:  Concentration: Fair and Attention Span: Fair  Recall:  of Knowledge:  Fair  Language:  Fair  Akathisia:  Negative  Handed:  Right  AIMS (if indicated):     Assets:  Resilience  ADL's:  Intact  Cognition:  WNL  Sleep:  Number of Hours: 5.75    COGNITIVE FEATURES THAT CONTRIBUTE TO RISK:  Closed-mindedness and Loss of executive function    SUICIDE RISK:  Extreme:  Frequent, intense, and enduring suicidal ideation, specific plans, clear subjective and objective intent, impaired self-control, severe dysphoria/symptomatology, many risk factors and no protective factors.  PLAN OF CARE:  Patient is a 54 y.o. female with no past psychiatric history who initially presented via EMS to [facility] after calling in the morning after she attempted suicide. Patient broke picture frame and caused injury to self bilateral wrist and  abrasion to neck. Pt is admitted for stabilization of her symptoms. Today, Pt is irritable, depressed, cooperative and oriented x3. Pt speech is slow with decreased volume. Pt's Mood is depressed, hopeless, irritable and worthless and affect is Flat. Her thought process is tangential, focused on SI. Pt is not espousing to any internal stimuli. Endorses SI with a plan (Sometimes thinks of jumping from a bridge) , Denies HI , denies AVH. Pt uses a walker.  BP- 119/65 mm HG, PR- 73/min. Labs- BMP -wnl except- Ca-8.6, Magnesium-1.6 on 8/18 Glucose- 89 , CBC is essentially normal except RDW-16.8 EKG- QTc- 424/470 Normal sinus rhythm Toxicology -Negative Alcohol < 10, Salicylate Level < 7 Pregnancy test- Negative on 04/15/20 MRI - 12/23/2016 -1. Bilateral medial thalamus signal abnormality as seen with the suspected Wernicke's encephalopathy. 2. Intermittently motion degraded exam causing some non-diagnostic Sequences/images. Plan- Daily contact with patient to assess and evaluate symptoms and progress in treatment -Monitor Vitals. -Monitor for Suicidal Ideation. -Monitor for withdrawal symptoms. -Monitor for medication side effects. -Send TSH.  -Increase Zoloft to 100 Mg from Tomorrow. -Start Albuterol Inhaler 2 puffs Q 4 H PRN for Wheeze, SOB. -Continue Folic acid 1 mg -Continue Ativan 1 mg Q 6H PRN for withdrawal symptoms if CIWA >10 -Continue Milk of Magnesia 30 ml PRN for constipation. -Continue Thiamin 100 mg Daily. -Continue Hydroxyzine 25 mg TID PRN for Anxiety. -Continue Trazodone 50 mg QHS PRN for sleep.  Observation Level/Precautions:  15 minute checks  Laboratory:  TSH  Psychotherapy:  Attend groups, active in milieu  Medications:  above  Consultations:  none  Discharge Concerns:  Isolated and lack of social support  Estimated LOS: 5-7 days  Other:  Ongoing suicidal thoughts   Physician Treatment Plan for Primary Diagnosis: Suicidal ideation Long Term Goal(s):  Improvement in symptoms so as ready for discharge  Short Term Goals: Ability to identify changes in lifestyle to reduce recurrence of condition will improve, Ability to verbalize feelings will improve, Ability to disclose and discuss suicidal ideas, Ability to demonstrate self-control will improve, Ability to identify and develop effective coping behaviors will improve, Ability to maintain clinical measurements within normal limits will improve, Compliance with prescribed medications will improve and Ability to identify triggers associated with substance abuse/mental health issues will improve  Physician Treatment Plan for Secondary Diagnosis: Principal Problem:   Suicidal ideation Active Problems:   Alcohol dependence (HCC)   Hypokalemia   Hypomagnesemia   Lactic acid acidosis   Elevated LFTs   Wernicke's encephalopathy   Anemia of chronic disease   Major depressive disorder, recurrent episode, severe (HCC)  Long Term Goal(s): Improvement in symptoms so as ready for discharge  Short Term Goals: Ability to identify changes in lifestyle to reduce recurrence of condition will improve, Ability to verbalize feelings will improve, Ability to disclose and discuss suicidal ideas, Ability to demonstrate self-control will improve, Ability to identify and develop effective coping behaviors will improve, Ability  to maintain clinical measurements within normal limits will improve, Compliance with prescribed medications will improve and Ability to identify triggers associated with substance abuse/mental health issues will improve    I certify that inpatient services furnished can reasonably be expected to improve the patient's condition.   Mariel Craft, MD 04/25/2020, 2:11 PM

## 2020-04-25 NOTE — Progress Notes (Signed)
Pt reports feeling the same, "tired of everything." Pt said "I take one step forward and I'm knocked 3 steps down." Pt said "what I do, I do to myself." Pt isn't able to identify any stressors or triggers. Pt said that she just stays away from everyone now. Pt repeatedly says I'm just tired of everything. Pt is depressed, cooperative, but irritable. Pt is passively suicidal without a plan and verbally contracts for safety. Denies HI and AVH. Active listening, reassurance, and support provided. Q 15 min safety checks continue. Pt's safety has been maintained.    04/25/20 2100  Psych Admission Type (Psych Patients Only)  Admission Status Voluntary  Psychosocial Assessment  Patient Complaints Anxiety;Appetite decrease;Depression;Hopelessness;Sadness;Irritability  Eye Contact Fair  Facial Expression Anxious;Sad;Sullen  Affect Anxious;Depressed;Sad  Speech Logical/coherent  Interaction Forwards little;Guarded  Motor Activity Unsteady;Slow  Appearance/Hygiene Unremarkable  Behavior Characteristics Cooperative;Anxious;Guarded;Irritable  Mood Depressed;Anxious;Sad;Irritable  Thought Process  Coherency WDL  Content Blaming others  Delusions None reported or observed  Perception WDL  Hallucination None reported or observed  Judgment Poor  Confusion None  Danger to Self  Current suicidal ideation? Passive  Self-Injurious Behavior No self-injurious ideation or behavior indicators observed or expressed   Agreement Not to Harm Self Yes  Description of Agreement verbally contracts for safety  Danger to Others  Danger to Others None reported or observed

## 2020-04-25 NOTE — BHH Counselor (Signed)
CSW contacted [employer]. They informed CSW there are no beds and to call back next week.

## 2020-04-25 NOTE — Tx Team (Signed)
Interdisciplinary Treatment and Diagnostic Plan Update  04/25/2020 Time of Session: 9:40am Virginia Hess MRN: [id number]  Principal Diagnosis: <principal problem not specified>  Secondary Diagnoses: Active Problems:   Suicidal ideation   Current Medications:  Current Facility-Administered Medications  Medication Dose Route Frequency Provider Last Rate Last Admin   acetaminophen (TYLENOL) tablet 650 mg  650 mg Oral Q6H PRN Sharma Covert, MD       alum & mag hydroxide-simeth (MAALOX/MYLANTA) 200-200-20 MG/5ML suspension 30 mL  30 mL Oral Q4H PRN Sharma Covert, MD       folic acid (FOLVITE) tablet 1 mg  1 mg Oral Daily Sharma Covert, MD   1 mg at 04/25/20 0759   hydrOXYzine (ATARAX/VISTARIL) tablet 25 mg  25 mg Oral TID PRN Sharma Covert, MD       LORazepam (ATIVAN) tablet 1 mg  1 mg Oral Q6H PRN Sharma Covert, MD       magnesium hydroxide (MILK OF MAGNESIA) suspension 30 mL  30 mL Oral Daily PRN Sharma Covert, MD       sertraline (ZOLOFT) tablet 50 mg  50 mg Oral Daily Sharma Covert, MD   50 mg at 04/25/20 9924   thiamine tablet 100 mg  100 mg Oral Daily Sharma Covert, MD   100 mg at 04/25/20 0759   traZODone (DESYREL) tablet 50 mg  50 mg Oral QHS PRN Sharma Covert, MD       PTA Medications: Medications Prior to Admission  Medication Sig Dispense Refill Last Dose   acetaminophen (TYLENOL) 325 MG tablet Take 650 mg by mouth every 6 (six) hours as needed for moderate pain.       albuterol (PROVENTIL HFA;VENTOLIN HFA) 108 (90 Base) MCG/ACT inhaler Inhale 2 puffs into the lungs every 4 (four) hours as needed for wheezing or shortness of breath. (Patient not taking: Reported on 04/23/2020) 1 Inhaler 0    azithromycin (ZITHROMAX) 250 MG tablet Take 1 tablet (250 mg total) by mouth daily. Take first 2 tablets together, then 1 every day until finished. (Patient not taking: Reported on 04/23/2020) 6 tablet 0    folic acid (FOLVITE) 1 MG  tablet Take 1 tablet (1 mg total) by mouth daily. (Patient not taking: Reported on 04/23/2020) 30 tablet 0    Multiple Vitamin (MULTIVITAMIN WITH MINERALS) TABS tablet Take 1 tablet by mouth daily. (Patient not taking: Reported on 04/23/2020) 30 tablet 0    predniSONE (DELTASONE) 20 MG tablet Take 2 tablets (40 mg total) by mouth daily. (Patient not taking: Reported on 04/23/2020) 10 tablet 0    thiamine 100 MG tablet Take 1 tablet (100 mg total) by mouth daily. 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   Physician Treatment Plan for Primary Diagnosis: <principal problem not specified> Long Term Goal(s):     Short Term Goals:    Medication Management: Evaluate patient's response, side effects, and tolerance of medication regimen.  Therapeutic Interventions: 1 to 1 sessions, Unit Group sessions and Medication administration.  Evaluation of Outcomes: Not Met  Physician Treatment Plan for Secondary Diagnosis: Active Problems:   Suicidal ideation  Long Term Goal(s):     Short Term Goals:  Medication Management: Evaluate patient's response, side effects, and tolerance of medication regimen.  Therapeutic Interventions: 1 to 1 sessions, Unit Group sessions and Medication administration.  Evaluation of Outcomes: Not Met   RN Treatment Plan for Primary Diagnosis: <principal problem not specified> Long Term Goal(s): Knowledge of disease and therapeutic regimen to maintain health will improve  Short Term Goals: Ability to remain free from injury will improve, Ability to verbalize frustration and anger appropriately will improve, Ability  to identify and develop effective coping behaviors will improve and Compliance with prescribed medications will improve  Medication Management: RN will administer medications as ordered by provider, will assess and evaluate patient's response and provide education to patient for prescribed medication. RN will report any adverse and/or side effects to prescribing provider.  Therapeutic Interventions: 1 on 1 counseling sessions, Psychoeducation, Medication administration, Evaluate responses to treatment, Monitor vital signs and CBGs as ordered, Perform/monitor CIWA, COWS, AIMS and Fall Risk screenings as ordered, Perform wound care treatments as ordered.  Evaluation of Outcomes: Not Met   LCSW Treatment Plan for Primary Diagnosis: <principal problem not specified> Long Term Goal(s): Safe transition to appropriate next level of care at discharge, Engage patient in therapeutic group addressing interpersonal concerns.  Short Term Goals: Engage patient in aftercare planning with referrals and resources, Increase social support, Increase ability to appropriately verbalize feelings, Identify triggers associated with mental health/substance abuse issues and Increase skills for wellness and recovery  Therapeutic Interventions: Assess for all discharge needs, 1 to 1 time with Social worker, Explore available resources and support systems, Assess for adequacy in community support network, Educate family and significant other(s) on suicide prevention, Complete Psychosocial Assessment, Interpersonal group therapy.  Evaluation of Outcomes: Not Met   Progress in Treatment: Attending groups: Yes. Participating in groups: Yes. Taking medication as prescribed: Yes. Toleration medication: Yes. Family/Significant other contact made: No, will contact:  if consent is given. Patient understands diagnosis: Yes. Discussing patient identified problems/goals with staff: Yes. Medical problems stabilized or resolved:  Yes. Denies suicidal/homicidal ideation: Yes. Issues/concerns per patient self-inventory: No.   New problem(s) identified: No, Describe:  none.  New Short Term/Long Term Goal(s): medication stabilization, elimination of SI thoughts, development of comprehensive mental wellness plan.   Patient Goals:  "I don't know"  Discharge Plan or Barriers: Patient recently admitted. CSW will continue to follow and assess for appropriate referrals and possible discharge planning.   Reason for Continuation of Hospitalization: Depression Medication stabilization Suicidal ideation  Estimated Length of Stay: 3-5 days  Attendees: Patient: Virginia Hess 04/25/2020   Physician: Melba Coon, MD 04/25/2020   Nursing:  04/25/2020   RN Care Manager: 04/25/2020  Social Worker: Darletta Moll, LCSW 04/25/2020   Recreational Therapist:  04/25/2020   Other: Armando Reichert, MD 04/25/2020   Other: Honor Junes, MD 04/25/2020  Other: 04/25/2020       Scribe for Treatment Team: Vassie Moselle, LCSW 04/25/2020 10:05 AM

## 2020-04-25 NOTE — H&P (Addendum)
Psychiatric Admission Assessment Adult  Patient Identification: Virginia Hess MRN:  [id number] Date of Evaluation:  04/25/2020 Chief Complaint:  Suicidal ideation [R45.851] Principal Diagnosis: Suicidal ideation Diagnosis:  Principal Problem:   Suicidal ideation Active Problems:   Alcohol dependence (HCC)   Hypokalemia   Hypomagnesemia   Lactic acid acidosis   Elevated LFTs   Wernicke's encephalopathy   Anemia of chronic disease   Major depressive disorder, recurrent episode, severe (HCC)  History of Present Illness: Patient is a 54 y.o. female with no past psychiatric history who [facility] after calling in the morning after she attempted suicide. Patient broke picture frame and caused injury to self bilateral wrist and abrasion to neck. She admitted to increased stress in her personal life which led her to her suicidal attempt. At initial clinical assessment with counselor she stated she has been dealing with family issues, primarily her four grown daughters taking advantage of her.  She shares that they started dropping off their kids with her after work on Fridays, with the expectation she would keep them all weekend. This has been ongoing until she recently left her job due to medical reasons. She stated she is "tired of being used." She denied any drug use. Patient admitted to history of alcohol use, however she stated she had cut back.  She was hospitalized in 2018 for wernicke's encephalopathy and she was advised to discontinue alcohol use.  She has since cut back, however has not discontinued use.  Pt is seen and examined today. Pt states she doesn't want to be seen as there is no use, she is done with her life and now she just wants to end it". She states "I am so tired of this life, I  just want to end it". When asked about recent stressors she just says " I am just tired". When asked about her family, Pt gets irritable and guarded and she stated she doesn't  want to speak about it. Pt states "you can give me medication but I don't have to take it when I go home, Its my life". Pt states she has been depressed for a while. She endorses hopelessness, helplessness, worthlessness, fatigue, decreased sleep, decreased energy, anhedonia and poor appetite but denies any guilt. Pt endorses suicidal ideations states she feels like hurting herself everyday. She states sometimes she feels like walking up the bridge and jumping over. Pt states she has problem with staying asleep and wakes up, watch TV and go back to sleep again. Pt states she was feeling good and had lots of energy when she was raising her kids. Patient has no past history of psychiatric treatment.  She denies past suicide attempts.   She has never been on any antidepressant or antianxiety medications. Denies any drug use. She denies HI and auditory and visual hallucinations. Pt denies paranoia. Pt doesn't want to give any collateral contact options.  Pt denies any medical illnesses. Pt is unemployed and lives alone.  On examination, Pt is irritable, depressed, cooperative and oriented x3. Pt speech is slow with decreased volume. Pt's Mood is depressed, hopeless, irritable and worthless and affect is Flat. Her thought process is tangential, focused on SI. Pt is not espousing to any internal stimuli. Endorses SI with a plan (Sometimes thinks of jumping from a bridge) , Denies HI , denies AVH. Pt uses a walker.  Associated Signs/Symptoms: Depression Symptoms:  depressed mood, anhedonia, insomnia, fatigue, feelings of worthlessness/guilt, hopelessness, suicidal thoughts without plan, loss  of energy/fatigue, decreased appetite, (Hypo) Manic Symptoms:  Impulsivity, Irritable Mood, Anxiety Symptoms:  None Psychotic Symptoms:  Delusions, Hallucinations: None PTSD Symptoms: Negative Total Time spent with patient and in coordination of care: 1 hour  Past Psychiatric History: None reported  Is the  patient at risk to self? Yes.    Has the patient been a risk to self in the past 6 months? No.  Has the patient been a risk to self within the distant past? No.  Is the patient a risk to others? No.  Has the patient been a risk to others in the past 6 months? No.  Has the patient been a risk to others within the distant past? No.   Prior Inpatient Therapy:   Prior Outpatient Therapy:    Alcohol Screening: 1. How often do you have a drink containing alcohol?: 2 to 3 times a week 2. How many drinks containing alcohol do you have on a typical day when you are drinking?: 1 or 2 3. How often do you have six or more drinks on one occasion?: Less than monthly AUDIT-C Score: 4 4. How often during the last year have you found that you were not able to stop drinking once you had started?: Less than monthly 5. How often during the last year have you failed to do what was normally expected from you because of drinking?: Never 6. How often during the last year have you needed a first drink in the morning to get yourself going after a heavy drinking session?: Never 7. How often during the last year have you had a feeling of guilt of remorse after drinking?: Never 8. How often during the last year have you been unable to remember what happened the night before because you had been drinking?: Never 9. Have you or someone else been injured as a result of your drinking?: No 10. Has a relative or friend or a doctor or another health worker been concerned about your drinking or suggested you cut down?: Yes, but not in the last year Alcohol Use Disorder Identification Test Final Score (AUDIT): 7 Substance Abuse History in the last 12 months:  Yes.  Alcohol Consequences of Substance Abuse: Medical Consequences:  Wernickes's enephelopathy Previous Psychotropic Medications: No  Psychological Evaluations: No  Past Medical History:  Past Medical History:  Diagnosis Date  . Alcohol dependence (HCC)   .  Depression   . Gravida 4 para 4     Past Surgical History:  Procedure Laterality Date  . NO PAST SURGERIES     Family History:  Family History  Problem Relation Age of Onset  . Hypertension Brother   . Cancer Neg Hx   . Heart disease Neg Hx   . Stroke Neg Hx    Family Psychiatric  History: None reported. Tobacco Screening:   denies Social History:  Social History   Substance and Sexual Activity  Alcohol Use Yes   Comment: 2 daily     Social History   Substance and Sexual Activity  Drug Use No    Additional Social History:       Lives alone.  Does not want family to be contacted                    Allergies:  No Known Allergies Lab Results:  Results for orders placed or performed during the hospital encounter of 04/22/20 (from the past 48 hour(s))  Basic metabolic panel     Status: Abnormal  Collection Time: 04/24/20  5:00 AM  Result Value Ref Range   Sodium 138 135 - 145 mmol/L   Potassium 3.6 3.5 - 5.1 mmol/L   Chloride 108 98 - 111 mmol/L   CO2 22 22 - 32 mmol/L   Glucose, Bld 89 70 - 99 mg/dL    Comment: Glucose reference range applies only to samples taken after fasting for at least 8 hours.   BUN 9 6 - 20 mg/dL   Creatinine, Ser 2.09 0.44 - 1.00 mg/dL   Calcium 8.6 (L) 8.9 - 10.3 mg/dL   GFR calc non Af Amer >60 >60 mL/min   GFR calc Af Amer >60 >60 mL/min   Anion gap 8 5 - 15    Comment: Performed at [facility], [address] [place], [place] [address]    Blood Alcohol level:  Lab Results  Component Value Date   ETH <10 04/22/2020   ETH 176 (H) 04/14/2020    Metabolic Disorder Labs:  No results found for: HGBA1C, MPG No results found for: PROLACTIN No results found for: CHOL, TRIG, HDL, CHOLHDL, VLDL, LDLCALC  Current Medications: Current Facility-Administered Medications  Medication Dose Route Frequency Provider Last Rate Last Admin  . acetaminophen (TYLENOL) tablet 650 mg  650 mg Oral Q6H PRN Antonieta Pert, MD       . albuterol (VENTOLIN HFA) 108 (90 Base) MCG/ACT inhaler 2 puff  2 puff Inhalation Q4H PRN Mariel Craft, MD      . alum & mag hydroxide-simeth (MAALOX/MYLANTA) 200-200-20 MG/5ML suspension 30 mL  30 mL Oral Q4H PRN Antonieta Pert, MD      . folic acid (FOLVITE) tablet 1 mg  1 mg Oral Daily Antonieta Pert, MD   1 mg at 04/25/20 2836  . hydrOXYzine (ATARAX/VISTARIL) tablet 25 mg  25 mg Oral TID PRN Antonieta Pert, MD      . LORazepam (ATIVAN) tablet 1 mg  1 mg Oral Q6H PRN Antonieta Pert, MD      . magnesium hydroxide (MILK OF MAGNESIA) suspension 30 mL  30 mL Oral Daily PRN Antonieta Pert, MD      . Melene Muller ON 04/26/2020] sertraline (ZOLOFT) tablet 100 mg  100 mg Oral Daily Mariel Craft, MD      . thiamine tablet 100 mg  100 mg Oral Daily Antonieta Pert, MD   100 mg at 04/25/20 0759  . traZODone (DESYREL) tablet 50 mg  50 mg Oral QHS PRN Antonieta Pert, MD       PTA Medications: Medications Prior to Admission  Medication Sig Dispense Refill Last Dose  . acetaminophen (TYLENOL) 325 MG tablet Take 650 mg by mouth every 6 (six) hours as needed for moderate pain.      Marland Kitchen albuterol (PROVENTIL HFA;VENTOLIN HFA) 108 (90 Base) MCG/ACT inhaler Inhale 2 puffs into the lungs every 4 (four) hours as needed for wheezing or shortness of breath. (Patient not taking: Reported on 04/23/2020) 1 Inhaler 0   . azithromycin (ZITHROMAX) 250 MG tablet Take 1 tablet (250 mg total) by mouth daily. Take first 2 tablets together, then 1 every day until finished. (Patient not taking: Reported on 04/23/2020) 6 tablet 0   . folic acid (FOLVITE) 1 MG tablet Take 1 tablet (1 mg total) by mouth daily. (Patient not taking: Reported on 04/23/2020) 30 tablet 0   . Multiple Vitamin (MULTIVITAMIN WITH MINERALS) TABS tablet Take 1 tablet by mouth daily. (Patient not taking:  Reported on 04/23/2020) 30 tablet 0   . predniSONE (DELTASONE) 20 MG tablet Take 2 tablets (40 mg total) by mouth daily. (Patient not  taking: Reported on 04/23/2020) 10 tablet 0   . thiamine 100 MG tablet Take 1 tablet (100 mg total) by mouth daily. (Patient not taking: Reported on 04/23/2020) 30 tablet 0     Musculoskeletal: Strength & Muscle Tone: within normal limits Gait & Station: unsteady, Pt uses a walker Patient leans: N/A  Psychiatric Specialty Exam: Physical Exam Vitals and nursing note reviewed.  Constitutional:      General: She is not in acute distress.    Appearance: Normal appearance. She is not ill-appearing, toxic-appearing or diaphoretic.  HENT:     Head: Normocephalic and atraumatic.  Pulmonary:     Effort: Pulmonary effort is normal.  Neurological:     Mental Status: She is alert.     Review of Systems  Constitutional: Positive for activity change and fatigue. Negative for fever.  Gastrointestinal: Negative for abdominal pain, constipation, diarrhea, nausea and vomiting.  Psychiatric/Behavioral: Positive for dysphoric mood, sleep disturbance and suicidal ideas. Negative for hallucinations. The patient is not hyperactive.     Blood pressure 119/65, pulse 73, temperature 97.9 F (36.6 C), temperature source Oral, resp. rate 18, height 5\' 4"  (1.626 m), weight 65.8 kg, SpO2 100 %.Body mass index is 24.9 kg/m.  General Appearance: Casual  Eye Contact:  Fair  Speech:  Slow  Volume:  Decreased  Mood:  Depressed, Hopeless, Irritable and Worthless  Affect:  Flat  Thought Process:  Descriptions of Associations: Tangential  Orientation:  Full (Time, Place, and Person)  Thought Content:  Delusions and Hallucinations: None  Suicidal Thoughts:  Yes.  with intent/plan (Sometimes thinks of jumping from a bridge)   Homicidal Thoughts:  No  Memory:  Immediate;   Fair Recent;   Fair Remote;   Fair  Judgement:  Impaired  Insight:  Lacking  Psychomotor Activity:  Decreased  Concentration:  Concentration: Fair and Attention Span: Fair  Recall:  of Knowledge:  Fair  Language:  Fair   Akathisia:  Negative  Handed:  Right  AIMS (if indicated):     Assets:  Resilience  ADL's:  Intact  Cognition:  WNL  Sleep:  Number of Hours: 5.75    Treatment Plan Summary: Patient is a 53 y.o. female with no past psychiatric history who initially presented via EMS to [facility] after calling in the morning after she attempted suicide. Patient broke picture frame and caused injury to self bilateral wrist and abrasion to neck. Pt is admitted for stabilization of her symptoms. Today, Pt is irritable, depressed, cooperative and oriented x3. Pt speech is slow with decreased volume. Pt's Mood is depressed, hopeless, irritable and worthless and affect is Flat. Her thought process is tangential, focused on SI. Pt is not espousing to any internal stimuli. Endorses SI with a plan (Sometimes thinks of jumping from a bridge) , Denies HI , denies AVH. Pt uses a walker.  BP- 119/65 mm HG, PR- 73/min. Labs- BMP -wnl except- Ca-8.6, Magnesium-1.6 on 8/18 Glucose- 89 , CBC is essentially normal except RDW-16.8 EKG- QTc- 424/470 Normal sinus rhythm Toxicology -Negative Alcohol < 10, Salicylate Level < 7 Pregnancy test- Negative on 04/15/20 MRI - 12/23/2016 -1. Bilateral medial thalamus signal abnormality as seen with the suspected Wernicke's encephalopathy. 2. Intermittently motion degraded exam causing some non-diagnostic Sequences/images. Plan- Daily contact with patient to assess and evaluate symptoms and progress in treatment -  Monitor Vitals. -Monitor for Suicidal Ideation. -Monitor for withdrawal symptoms. -Monitor for medication side effects. -Send TSH.  -Increase Zoloft to 100 Mg from Tomorrow. -Start Albuterol Inhaler 2 puffs Q 4 H PRN for Wheeze, SOB. -Continue Folic acid 1 mg -Continue Ativan 1 mg Q 6H PRN for withdrawal symptoms if CIWA >10 -Continue Milk of Magnesia 30 ml PRN for constipation. -Continue Thiamin 100 mg Daily. -Continue Hydroxyzine 25 mg TID PRN for Anxiety. -Continue  Trazodone 50 mg QHS PRN for sleep.  Observation Level/Precautions:  15 minute checks  Laboratory:  TSH  Psychotherapy:  Attend groups, active in milieu  Medications:  above  Consultations:  none  Discharge Concerns:  Isolated and lack of social support  Estimated LOS: 5-7 days  Other:  Ongoing suicidal thoughts   Physician Treatment Plan for Primary Diagnosis: Suicidal ideation Long Term Goal(s): Improvement in symptoms so as ready for discharge  Short Term Goals: Ability to identify changes in lifestyle to reduce recurrence of condition will improve, Ability to verbalize feelings will improve, Ability to disclose and discuss suicidal ideas, Ability to demonstrate self-control will improve, Ability to identify and develop effective coping behaviors will improve, Ability to maintain clinical measurements within normal limits will improve, Compliance with prescribed medications will improve and Ability to identify triggers associated with substance abuse/mental health issues will improve  Physician Treatment Plan for Secondary Diagnosis: Principal Problem:   Suicidal ideation Active Problems:   Alcohol dependence (HCC)   Hypokalemia   Hypomagnesemia   Lactic acid acidosis   Elevated LFTs   Wernicke's encephalopathy   Anemia of chronic disease   Major depressive disorder, recurrent episode, severe (HCC)  Long Term Goal(s): Improvement in symptoms so as ready for discharge  Short Term Goals: Ability to identify changes in lifestyle to reduce recurrence of condition will improve, Ability to verbalize feelings will improve, Ability to disclose and discuss suicidal ideas, Ability to demonstrate self-control will improve, Ability to identify and develop effective coping behaviors will improve, Ability to maintain clinical measurements within normal limits will improve, Compliance with prescribed medications will improve and Ability to identify triggers associated with substance abuse/mental  health issues will improve  I certify that inpatient services furnished can reasonably be expected to improve the patient's condition.     Karsten Ro, MD 8/20/202111:35 AM   I have reviewed the note by Dr. Leone Haven, and I am in agreement with the assessment and plan. In summary, Virginia Hess is a 54 y.o. female with a history of alcohol use disorder and Wernicke's encephalopathy.  She continues to endorse suicidal ideation with a plan, which she will not divulge, "something different that will work next time."  She is agreeable to taking medication in the hospital, but states that she is not intending to take medications when she leaves the hospital.  She admits that she has already stopped other medications, with the intent of dying.  Mariel Craft, MD   Velna Hatchet Sherilyn Banker, MD 8/20/20211:55 PM

## 2020-04-25 NOTE — Plan of Care (Signed)
Progress note  D: pt found in bed; compliant with medication administration. Pt continues to use their walker. Pt does have feelings of dizziness at times. Pt was instructed on fall precautions with evidence of learning. Pt continues to voice concern with depression and seemed hesitant to take their medications. Pt did eventually with encouragement. Pt continues to voice thoughts of suicide with no plan while at [facility]. Pt states they will approach staff if they think of a plan while here. Pt denies hi/ah/vh and verbally agrees to approach staff if these become apparent or before harming themself/others while at [facility].  A: Pt provided support and encouragement. Pt given medication per protocol and standing orders. Q6m safety checks implemented and continued.  R: Pt safe on the unit. Will continue to monitor.  Pt progressing in the following metrics  Problem: Education: Goal: Utilization of techniques to improve thought processes will improve Outcome: Progressing Goal: Knowledge of the prescribed therapeutic regimen will improve Outcome: Progressing   Problem: Activity: Goal: Imbalance in normal sleep/wake cycle will improve Outcome: Progressing   Problem: Coping: Goal: Will verbalize feelings Outcome: Progressing

## 2020-04-25 NOTE — Progress Notes (Signed)
   04/25/20 2000  COVID-19 Daily Checkoff  Have you had a fever (temp > 37.80C/100F)  in the past 24 hours?  No  COVID-19 EXPOSURE  Have you traveled outside the state in the past 14 days? No  Have you been in contact with someone with a confirmed diagnosis of COVID-19 or PUI in the past 14 days without wearing appropriate PPE? No  Have you been living in the same home as a person with confirmed diagnosis of COVID-19 or a PUI (household contact)? No  Have you been diagnosed with COVID-19? No

## 2020-04-25 NOTE — Progress Notes (Signed)
Pt c/o feeling dizzy when ambulating, a headache, and a poor appetite. Pt's vital signs were obtained. At 2053, blood pressure was 165/79 with a pulse of 73 and O2 of 100% on room air while lying. While sitting at 2054, blood pressure was 170/86 and then standing at 2055 it was 183/100. Pt said her headache has decreased and has almost ceased after laying in bed. Pt offered pain medicine but refused and was encouraged to rest. Pt denies a hx of HTN. On call NP, Gillermo Murdoch notified. Also informed NP that it may be attributed to her zoloft since her dosage was increased, she received a total of 100 mg today. NP ordered to continue to monitor pt's status and recheck vitals at 0600. Pt informed and educated about fall risk prevention. Pt continues to wear her yellow non-skid socks and demonstrates understanding of the education. Q 15 min safety checks continue. Pt's safety has been maintained.
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
## 2020-04-25 NOTE — Progress Notes (Signed)
Pt has been observed throughout the shift, no issues or concerns reported, denied si/hi and contracted for safety, will continue to monitor.

## 2020-04-25 NOTE — Progress Notes (Signed)
Adult Psychoeducational Group Note  Date:  04/25/2020 Time:  10:41 AM  Group Topic/Focus:  Goals Group:   The focus of this group is to help patients establish daily goals to achieve during treatment and discuss how the patient can incorporate goal setting into their daily lives to aide in recovery.  Participation Level:  Active  Participation Quality:  Appropriate  Affect:  Appropriate  Cognitive:  Appropriate  Insight: Appropriate  Engagement in Group:  Engaged  Modes of Intervention:  Discussion  Additional Comments:  Patient attended goal setting and Set back in Recovery Group and participated.   Estelle Skibicki W Jaianna Nicoll 04/25/2020, 10:41 AM

## 2020-04-25 NOTE — Progress Notes (Signed)
Recreation Therapy Notes  Date:  8.20.21 Time: 0930 Location: 300 Hall Dayroom  Group Topic: Stress Management  Goal Area(s) Addresses:  Patient will identify positive stress management techniques. Patient will identify benefits of using stress management post d/c.  Behavioral Response: Engaged  Intervention: Stress Management  Activity: Guided Imagery.  LRT read a script that led patients on a journey to the beach to hear the peaceful waves.  Pt were to listen and follow along as script was read to engage in activity.   Education:  Stress Management, Discharge Planning.   Education Outcome: Acknowledges Education  Clinical Observations/Feedback: Pt attended and participated in activity.    Caroll Rancher, LRT/CTRS        Caroll Rancher A 04/25/2020 11:32 AM

## 2020-04-26 DIAGNOSIS — I10 Essential (primary) hypertension: Secondary | ICD-10-CM | POA: Diagnosis not present

## 2020-04-26 MED ORDER — HYDROCHLOROTHIAZIDE 12.5 MG PO CAPS
12.5000 mg | ORAL_CAPSULE | Freq: Every day | ORAL | Status: DC
  Administered 2020-04-26 – 2020-05-02 (×4): 12.5 mg via ORAL
  Filled 2020-04-26 (×10): qty 1

## 2020-04-26 NOTE — Progress Notes (Signed)
[facility] MD Progress Note  04/26/2020 6:24 PM Virginia Hess  MRN:  [id number]   Subjective: Patient states " I am still suicidal but I can talk to you today". She states she still feels suicidal but does not have any plan. She states she is depressed-sad mood. She states she dis not sleep well last night. She states she did not eat her breakfast but looking forward to eat lunch. She states she is nauseous, has not thrown up but she thinks it is because of medications. She states her blood pressure was high last night, this morning as well but it was fine later in the morning. She denies any homicidal ideations. She denies any auditory or visual hallucinations. She denies any withdrawal symptoms except headaches last night.  Objective: Patient is seen and examined. She was difficult to engage but during the interview became polite & respectful. She is oriented*3, does not seem like responding to the internal stimuli. She is counseled about value of life and how suicide in family can impact children, grandchildren and family members. She shows her understanding and asked for time to think about this until tomorrow morning. She is educated more about ways to help depressed mood, hopelessness, worthlessness and encouraged to ask questions. Patient is seen engaged and deeply thinking, asking questions and showing her understanding of the situation. She promised to talk more about her personal life and ongoing stressors tomorrow morning. Patient is thankful at the end of interview and promised to contract for safety if needed. Her blood pressure has been high constantly. Her recent one is 163/80 at 6 pm, was 141/70 in the morning, was 183/100 last evening. According to nurses note "tired of everything." Pt said "I take one step forward and I'm knocked 3 steps down." Pt said "what I do, I do to myself." Pt isn't able to identify any stressors or triggers. Pt said that she just stays away from everyone now. Pt  repeatedly says I'm just tired of everything. Pt is depressed, cooperative, but irritable. Pt is passively suicidal without a plan and verbally contracts for safety. She attended group therapy today and rated her energy @1 /10, expressed helplessness and hopelessness.   Conversation about the blood pressure control: Patient is informed about her increased blood pressure and her need to take medications to control it. She is given opportunity to ask questions.Patient shows understanding about the diagnosis and the treatment.  Principal Problem: Suicidal ideation Diagnosis: Principal Problem:   Suicidal ideation Active Problems:   Alcohol dependence (HCC)   Hypokalemia   Hypomagnesemia   Lactic acid acidosis   Elevated LFTs   Wernicke's encephalopathy   Anemia of chronic disease   Major depressive disorder, recurrent episode, severe (HCC)   Essential hypertension  Total Time spent with patient: 45 minutes  Past Psychiatric History: See H & P  Past Medical History:  Past Medical History:  Diagnosis Date  . Alcohol dependence (HCC)   . Depression   . Gravida 4 para 4     Past Surgical History:  Procedure Laterality Date  . NO PAST SURGERIES     Family History:  Family History  Problem Relation Age of Onset  . Hypertension Brother   . Cancer Neg Hx   . Heart disease Neg Hx   . Stroke Neg Hx    Family Psychiatric  History: See H & P Social History:  Social History   Substance and Sexual Activity  Alcohol Use Yes   Comment: 2 daily  Social History   Substance and Sexual Activity  Drug Use No    Social History   Socioeconomic History  . Marital status: Single    Spouse name: Not on file  . Number of children: Not on file  . Years of education: Not on file  . Highest education level: Not on file  Occupational History  . Not on file  Tobacco Use  . Smoking status: Current Every Day Smoker    Packs/day: 0.50    Years: 35.00    Pack years: 17.50    Types:  Cigarettes  . Smokeless tobacco: Never Used  Substance and Sexual Activity  . Alcohol use: Yes    Comment: 2 daily  . Drug use: No  . Sexual activity: Not on file  Other Topics Concern  . Not on file  Social History Narrative  . Not on file   Social Determinants of Health   Financial Resource Strain:   . Difficulty of Paying Living Expenses: Not on file  Food Insecurity:   . Worried About Programme researcher, broadcasting/film/video in the Last Year: Not on file  . Ran Out of Food in the Last Year: Not on file  Transportation Needs:   . Lack of Transportation (Medical): Not on file  . Lack of Transportation (Non-Medical): Not on file  Physical Activity:   . Days of Exercise per Week: Not on file  . Minutes of Exercise per Session: Not on file  Stress:   . Feeling of Stress : Not on file  Social Connections:   . Frequency of Communication with Friends and Family: Not on file  . Frequency of Social Gatherings with Friends and Family: Not on file  . Attends Religious Services: Not on file  . Active Member of Clubs or Organizations: Not on file  . Attends Banker Meetings: Not on file  . Marital Status: Not on file   Additional Social History:                         Sleep: Fair  Appetite:  Fair  Current Medications: Current Facility-Administered Medications  Medication Dose Route Frequency Provider Last Rate Last Admin  . acetaminophen (TYLENOL) tablet 650 mg  650 mg Oral Q6H PRN Antonieta Pert, MD   650 mg at 04/26/20 1418  . albuterol (VENTOLIN HFA) 108 (90 Base) MCG/ACT inhaler 2 puff  2 puff Inhalation Q4H PRN Mariel Craft, MD      . alum & mag hydroxide-simeth (MAALOX/MYLANTA) 200-200-20 MG/5ML suspension 30 mL  30 mL Oral Q4H PRN Antonieta Pert, MD      . folic acid (FOLVITE) tablet 1 mg  1 mg Oral Daily Antonieta Pert, MD   1 mg at 04/26/20 0830  . hydrochlorothiazide (MICROZIDE) capsule 12.5 mg  12.5 mg Oral Daily Sweta Halseth, MD      .  hydrOXYzine (ATARAX/VISTARIL) tablet 25 mg  25 mg Oral TID PRN Antonieta Pert, MD      . LORazepam (ATIVAN) tablet 1 mg  1 mg Oral Q6H PRN Antonieta Pert, MD      . magnesium hydroxide (MILK OF MAGNESIA) suspension 30 mL  30 mL Oral Daily PRN Antonieta Pert, MD      . sertraline (ZOLOFT) tablet 100 mg  100 mg Oral Daily Mariel Craft, MD   100 mg at 04/26/20 0830  . traZODone (DESYREL) tablet 50 mg  50 mg Oral QHS  PRN Antonieta Pertlary, Greg Lawson, MD        Lab Results:  Results for orders placed or performed during the hospital encounter of 04/24/20 (from the past 48 hour(s))  TSH     Status: None   Collection Time: 04/25/20  5:39 PM  Result Value Ref Range   TSH 1.295 0.350 - 4.500 uIU/mL    Comment: Performed by a 3rd Generation assay with a functional sensitivity of <=0.01 uIU/mL. Performed at [facility], [address] [place], [place] [id number]     Blood Alcohol level:  Lab Results  Component Value Date   ETH <10 04/22/2020   ETH 176 (H) 04/14/2020    Metabolic Disorder Labs: No results found for: HGBA1C, MPG No results found for: PROLACTIN No results found for: CHOL, TRIG, HDL, CHOLHDL, VLDL, LDLCALC  Physical Findings: AIMS: Facial and Oral Movements Muscles of Facial Expression: None, normal Lips and Perioral Area: None, normal Jaw: None, normal Tongue: None, normal,Extremity Movements Upper (arms, wrists, hands, fingers): None, normal Lower (legs, knees, ankles, toes): None, normal, Trunk Movements Neck, shoulders, hips: None, normal, Overall Severity Severity of abnormal movements (highest score from questions above): None, normal Incapacitation due to abnormal movements: None, normal Patient's awareness of abnormal movements (rate only patient's report): No Awareness, Dental Status Current problems with teeth and/or dentures?: No Does patient usually wear dentures?: No  CIWA:  CIWA-Ar Total: 0 COWS:     Musculoskeletal: Strength &  Muscle Tone: abnormal Gait & Station: unsteady Patient leans: Front  Psychiatric Specialty Exam: Physical Exam Vitals and nursing note reviewed.  Constitutional:      General: She is not in acute distress.    Appearance: Normal appearance. She is not ill-appearing, toxic-appearing or diaphoretic.  HENT:     Head: Normocephalic and atraumatic.  Pulmonary:     Effort: Pulmonary effort is normal.  Neurological:     Mental Status: She is alert and oriented to person, place, and time.     Review of Systems  Constitutional: Positive for appetite change.  Cardiovascular: Positive for palpitations.  Gastrointestinal: Positive for nausea.  Musculoskeletal: Positive for gait problem.  Neurological: Positive for headaches.  Psychiatric/Behavioral: Positive for dysphoric mood and suicidal ideas.    Blood pressure 139/89, pulse 96, temperature 98.6 F (37 C), temperature source Oral, resp. rate 18, height 5\' 4"  (1.626 m), weight 65.8 kg, SpO2 100 %.Body mass index is 24.9 kg/m.  General Appearance: Casual  Eye Contact:  Fair  Speech:  Normal Rate  Volume:  Normal  Mood:  Depressed, Dysphoric, Hopeless and Worthless  Affect:  Restricted  Thought Process:  Linear and Descriptions of Associations: Intact  Orientation:  Full (Time, Place, and Person)  Thought Content:  NA  Suicidal Thoughts:  Yes.  without intent/plan  Homicidal Thoughts:  No  Memory:  Immediate;   Good Recent;   Good  Judgement:  Impaired  Insight:  Lacking  Psychomotor Activity:  Decreased  Concentration:  Concentration: Good and Attention Span: Good  Recall:  Good  Fund of Knowledge:  Good  Language:  Good  Akathisia:  Negative  Handed:  Right  AIMS (if indicated):     Assets:  Resilience  ADL's:  Intact  Cognition:  WNL  Sleep:  Number of Hours: 6.5   Assessment: Patient is a 54 y.o. female with no past psychiatric history who [facility] via EMS [facility] after calling in the morning after she  attempted suicide. Patient broke picture frame and  caused injury to self bilateral wrist and abrasion to neck. Pt is admitted for stabilization of her symptoms.  Today patient was difficult to engage at first but was polite and respectful as the interview progressed. She admits to suicidal ideation, denies homicidal ideation, denies AVH. She is seen in day room engaging with other patients.  Treatment Plan Summary: Daily contact with patient to assess and evaluate symptoms and progress in treatment.   1. Continue Zoloft 100 mg for depression. 2. Continue Ativan 1 mg Q6h PRN for withdrawal symptoms. 3. Start Hydrochlorothiazide 12.5 mg for blood pressure control. 4. Continue Vistaril 25 mg PO TID PRN for anxiety. 5. Continue Trazodone 50 mg PO QHS PRN for insomnia. 6. Encourgement to attend group therapies. 7. Disposition in progress.  Arnoldo Lenis, MD 04/26/2020, 6:24 PM

## 2020-04-26 NOTE — [facility] Group Notes (Signed)
Adult Psychoeducational Group Note  Date:  04/26/2020 Time:  11:25 AM  Group Topic/Focus:  Goals Group:   The focus of this group is to help patients establish daily goals to achieve during treatment and discuss how the patient can incorporate goal setting into their daily lives to aide in recovery.  Participation Level:  Minimal  Participation Quality:  Resistant  Affect:  Flat  Cognitive:  Appropriate  Insight: Limited  Engagement in Group:  Lacking  Modes of Intervention:  Discussion, Education, Exploration and Support  Additional Comments:  Pt rates her energy at a 1/10.Marland Kitchen Expresses helplessness and hopelessness. Affect is flat and has poor eye contact  Dione Housekeeper 04/26/2020, 11:25 AM

## 2020-04-26 NOTE — Progress Notes (Signed)
  Date:  04/26/2020 Time:  8:53 PM   Group Topic/Focus:  Goals Group:   The focus of this group is to help patients Review daily goals to achieve during treatment and discuss how the patient can incorporate goal setting into their daily lives to aide in recovery. This group also aides in reassessing group hgoals and modifying them in case the goal set was not a good fit.   Participation Level:  Active   Participation Quality:  Appropriate   Affect:  Appropriate   Cognitive:  Appropriate   Insight: Appropriate   Engagement in Group:  Engaged   Modes of Intervention:  Discussion   Additional Comments:  Patient attended goal review and wrap up in Recovery Group and participated. Patient rated today a 6.   Porschea Borys Foxx 04/26/2020, 8:55 PM

## 2020-04-26 NOTE — Progress Notes (Signed)
   04/26/20 2020  COVID-19 Daily Checkoff  Have you had a fever (temp > 37.80C/100F)  in the past 24 hours?  No  COVID-19 EXPOSURE  Have you traveled outside the state in the past 14 days? No  Have you been in contact with someone with a confirmed diagnosis of COVID-19 or PUI in the past 14 days without wearing appropriate PPE? No  Have you been living in the same home as a person with confirmed diagnosis of COVID-19 or a PUI (household contact)? No  Have you been diagnosed with COVID-19? No

## 2020-04-26 NOTE — Progress Notes (Signed)
   04/26/20 0900  Psych Admission Type (Psych Patients Only)  Admission Status Voluntary  Psychosocial Assessment  Patient Complaints Depression  Eye Contact Fair  Facial Expression Flat  Affect Depressed  Speech Logical/coherent  Interaction Forwards little  Motor Activity Unsteady;Slow  Appearance/Hygiene Unremarkable  Behavior Characteristics Guarded  Mood Depressed  Thought Process  Coherency WDL  Content WDL  Delusions None reported or observed  Perception WDL  Hallucination None reported or observed  Judgment Poor  Confusion None  Danger to Self  Current suicidal ideation? Denies  Self-Injurious Behavior No self-injurious ideation or behavior indicators observed or expressed   Danger to Others  Danger to Others None reported or observed   Orders reviewed. V/s reviewed and reassessed. Verbal support provided. Pt encouraged to attend groups. 15 minute checks performed for safety.  Pt compliant with taking meds. Pt c/o "nausea and having an upset stomach yesterday from taking the Zoloft."  The pt did not complain of any side effects today.

## 2020-04-27 MED ORDER — SERTRALINE HCL 100 MG PO TABS
200.0000 mg | ORAL_TABLET | Freq: Every day | ORAL | Status: DC
  Administered 2020-04-28 – 2020-05-01 (×4): 200 mg via ORAL
  Filled 2020-04-27 (×6): qty 2

## 2020-04-27 MED ORDER — SERTRALINE HCL 100 MG PO TABS
100.0000 mg | ORAL_TABLET | Freq: Once | ORAL | Status: AC
  Administered 2020-04-27: 100 mg via ORAL
  Filled 2020-04-27: qty 1

## 2020-04-27 MED ORDER — TRAZODONE HCL 100 MG PO TABS
100.0000 mg | ORAL_TABLET | Freq: Every evening | ORAL | Status: DC | PRN
  Administered 2020-04-27 – 2020-05-03 (×7): 100 mg via ORAL
  Filled 2020-04-27 (×7): qty 1

## 2020-04-27 MED ORDER — THIAMINE HCL 100 MG PO TABS
100.0000 mg | ORAL_TABLET | Freq: Every day | ORAL | Status: DC
  Administered 2020-04-27 – 2020-05-06 (×10): 100 mg via ORAL
  Filled 2020-04-27 (×12): qty 1

## 2020-04-27 NOTE — [facility] Group Notes (Signed)
Date:  04/27/2020 Time:  9:00am-9:45am  Group Topic/Focus: PROGRESSIVE RELAXATION. A group where deep breathing is taught and tensing and relaxation muscle groups is used. Imagery is used as well.  Pts are asked to imagine 3 pillars that hold them up when they are not able to hold themselves up.  Participation Level:  Appropriate  Participation Quality:  Appropriate  Affect:  Sad, depressed  Cognitive:  Oriented  Insight: Improving  Engagement in Group:  Engaged. Modes of Intervention:  Activity, Discussion, Education, and Support  Additional Comments:  Did the exercise, but  Was unable to state any pillars that hold her up when is is unable to hold herself up. Rates her energy level at a 1/10     Dione Housekeeper 04/27/2020

## 2020-04-27 NOTE — Progress Notes (Signed)
Pt has spent the majority of her time tonight watching television in the dayroom. Pt is passively suicidal without a plan and verbally contracts for safety. Pt said "what can I do while I'm here." Pt did say that if she wasn't here, she would jump off a bridge. Pt rates her depression an 8 on a scale of 0-10 (10 being the worse). Pt was asked about her stressors/triggers and when her family was mentioned, pt got angry and said we're not talking about that. Pt reports a poor appetite. She said that she didn't get up for breakfast, but at lunch time ate [place] stuffing and a salad. Ate about 50% of her meal, but her intake at supper was poor. Pt reports having a one time sudden episode of nausea yesterday, but doesn't report any nausea today. Continues to c/o dizziness when she gets up to ambulate, but says that she feels better when she sits down. Pt educated to change positions slowly and dangle on the side of her bed for a minute before standing up. Pt verbalizes understanding and continues on high fall risk protocol. Active listening, reassurance, and support provided. Pt denies HI and AVH. Q 15 min safety checks continue. Pt's safety has been maintained.

## 2020-04-27 NOTE — Progress Notes (Signed)
[facility] MD Progress Note  04/27/2020 2:31 PM MORNINGSTAR TOFT  MRN:  [id number]   Subjective: Patient states " I am suicidal and after leaving from here, I am going to jump off the bridge". She states she is depressed-sad mood. She states she did not sleep well last night because of all the chaos in hallway at night. She states she ate a portion of her breakfast this morning. She denies of nausea today but states she feels dizzy when she gets up from bed. She states her blood pressure feels fine today, no headache. She denies any homicidal ideations. She denies any auditory or visual hallucinations. She states she does not want to do anything with her family or so called friends. She states when they needed her she was there for them but when she needed them they are nowhere to be found. She states thinking about family or friends make her angry & upset. She talks about her cat in the past and states once she let her out to get fresh air she never returned and she does not plan to get any pet for her.  Objective: Patient is seen and examined. She was polite & respectful on approach and immediately followed the provider. She is oriented*3, does not seem like responding to the internal stimuli. She looks dysphoric, hopeless.She is again counseled about value of life and how suicide in family can impact children, grandchildren and family members. She shows poor insight and judgement about it and thinks no one would care if she dies. She is educated more about ways to help depressed mood, hopelessness, worthlessness and encouraged to ask questions. Patient is seen engaged and was provided with active listening and answering her questions. Patient is thankful at the end of interview and promised to contract for safety if needed. Her blood pressure is 124/84, pulse 107, temperature 98.4 F (36.9 C), temperature source Oral, resp. rate 18. According to nurses note "Pt has spent the majority of her time tonight watching  television in the dayroom. Pt is passively suicidal without a plan and verbally contracts for safety. Pt said "what can I do while I'm here." Pt did say that if she wasn't here, she would jump off a bridge. Pt rates her depression an 8 on a scale of 0-10 (10 being the worse). Pt was asked about her stressors/triggers and when her family was mentioned, pt got angry and said we're not talking about that. Pt reports a poor appetite. She said that she didn't get up for breakfast, but at lunch time ate [place] stuffing and a salad. Ate about 50% of her meal, but her intake at supper was poor. Pt reports having a one time sudden episode of nausea yesterday, but doesn't report any nausea today. Continues to c/o dizziness when she gets up to ambulate, but says that she feels better when she sits down. Pt educated to change positions slowly and dangle on the side of her bed for a minute before standing up. Pt verbalizes understanding and continues on high fall risk protocol".  Phone conversation with daughter Corky Downs @ [phone]: Jamal Maes calling multiple times, no answer, will try again later in the evening to collect collateral information.  Possible plan for the patient: Increased her Zoloft to 200 mg today, if that does not help, then add Abilify 5 mg tomorrow. After that if presents with same affect and suiidal ideation- a consult for Electroconvulsive Therapy.   Principal Problem: Major depressive disorder, recurrent episode, severe (HCC)  Diagnosis: Principal Problem:   Major depressive disorder, recurrent episode, severe (HCC) Active Problems:   Alcohol dependence (HCC)   Hypokalemia   Hypomagnesemia   Lactic acid acidosis   Elevated LFTs   Wernicke's encephalopathy   Anemia of chronic disease   Suicidal ideation   Essential hypertension  Total Time spent with patient: 35 minutes  Past Psychiatric History: See H & P  Past Medical History:  Past Medical History:  Diagnosis Date    Alcohol dependence (HCC)    Depression    Gravida 4 para 4     Past Surgical History:  Procedure Laterality Date   NO PAST SURGERIES     Family History:  Family History  Problem Relation Age of Onset   Hypertension Brother    Cancer Neg Hx    Heart disease Neg Hx    Stroke Neg Hx    Family Psychiatric  History: See H & P Social History:  Social History   Substance and Sexual Activity  Alcohol Use Yes   Comment: 2 daily     Social History   Substance and Sexual Activity  Drug Use No    Social History   Socioeconomic History   Marital status: Single    Spouse name: Not on file   Number of children: Not on file   Years of education: Not on file   Highest education level: Not on file  Occupational History   Not on file  Tobacco Use   Smoking status: Current Every Day Smoker    Packs/day: 0.50    Years: 35.00    Pack years: 17.50    Types: Cigarettes   Smokeless tobacco: Never Used  Substance and Sexual Activity   Alcohol use: Yes    Comment: 2 daily   Drug use: No   Sexual activity: Not on file  Other Topics Concern   Not on file  Social History Narrative   Not on file   Social Determinants of Health   Financial Resource Strain:    Difficulty of Paying Living Expenses: Not on file  Food Insecurity:    Worried About Programme researcher, broadcasting/film/videounning Out of Food in the Last Year: Not on file   [employer] Out of Food in the Last Year: Not on file  Transportation Needs:    Lack of Transportation (Medical): Not on file   Lack of Transportation (Non-Medical): Not on file  Physical Activity:    Days of Exercise per Week: Not on file   Minutes of Exercise per Session: Not on file  Stress:    Feeling of Stress : Not on file  Social Connections:    Frequency of Communication with Friends and Family: Not on file   Frequency of Social Gatherings with Friends and Family: Not on file   Attends Religious Services: Not on file   Active Member of Clubs or  Organizations: Not on file   Attends BankerClub or Organization Meetings: Not on file   Marital Status: Not on file   Additional Social History:                         Sleep: Fair  Appetite:  Fair  Current Medications: Current Facility-Administered Medications  Medication Dose Route Frequency Provider Last Rate Last Admin   acetaminophen (TYLENOL) tablet 650 mg  650 mg Oral Q6H PRN Antonieta Pertlary, Greg Lawson, MD   650 mg at 04/26/20 1418   albuterol (VENTOLIN HFA) 108 (90 Base) MCG/ACT inhaler 2 puff  2 puff Inhalation Q4H PRN Mariel Craft, MD       alum & mag hydroxide-simeth (MAALOX/MYLANTA) 200-200-20 MG/5ML suspension 30 mL  30 mL Oral Q4H PRN Antonieta Pert, MD       folic acid (FOLVITE) tablet 1 mg  1 mg Oral Daily Antonieta Pert, MD   1 mg at 04/27/20 3295   hydrochlorothiazide (MICROZIDE) capsule 12.5 mg  12.5 mg Oral Daily Henretta Quist, Geralynn Rile, MD   12.5 mg at 04/27/20 1884   hydrOXYzine (ATARAX/VISTARIL) tablet 25 mg  25 mg Oral TID PRN Antonieta Pert, MD       LORazepam (ATIVAN) tablet 1 mg  1 mg Oral Q6H PRN Antonieta Pert, MD       magnesium hydroxide (MILK OF MAGNESIA) suspension 30 mL  30 mL Oral Daily PRN Antonieta Pert, MD       sertraline (ZOLOFT) tablet 100 mg  100 mg Oral Once Bethanie Bloxom, Geralynn Rile, MD       Melene Muller ON 04/28/2020] sertraline (ZOLOFT) tablet 200 mg  200 mg Oral Daily Marshayla Mitschke, MD       traZODone (DESYREL) tablet 50 mg  50 mg Oral QHS PRN Antonieta Pert, MD        Lab Results:  Results for orders placed or performed during the hospital encounter of 04/24/20 (from the past 48 hour(s))  TSH     Status: None   Collection Time: 04/25/20  5:39 PM  Result Value Ref Range   TSH 1.295 0.350 - 4.500 uIU/mL    Comment: Performed by a 3rd Generation assay with a functional sensitivity of <=0.01 uIU/mL. Performed at [facility], [address], [place], [place] [address]     Blood Alcohol level:  Lab Results   Component Value Date   ETH <10 04/22/2020   ETH 176 (H) 04/14/2020    Metabolic Disorder Labs: No results found for: HGBA1C, MPG No results found for: PROLACTIN No results found for: CHOL, TRIG, HDL, CHOLHDL, VLDL, LDLCALC  Physical Findings: AIMS: Facial and Oral Movements Muscles of Facial Expression: None, normal Lips and Perioral Area: None, normal Jaw: None, normal Tongue: None, normal,Extremity Movements Upper (arms, wrists, hands, fingers): None, normal Lower (legs, knees, ankles, toes): None, normal, Trunk Movements Neck, shoulders, hips: None, normal, Overall Severity Severity of abnormal movements (highest score from questions above): None, normal Incapacitation due to abnormal movements: None, normal Patient's awareness of abnormal movements (rate only patient's report): No Awareness, Dental Status Current problems with teeth and/or dentures?: No Does patient usually wear dentures?: No  CIWA:  CIWA-Ar Total: 0 COWS:     Musculoskeletal: Strength & Muscle Tone: abnormal Gait & Station: unsteady Patient leans: Front  Psychiatric Specialty Exam: Physical Exam Vitals and nursing note reviewed.  Constitutional:      General: She is not in acute distress.    Appearance: Normal appearance. She is not ill-appearing, toxic-appearing or diaphoretic.  HENT:     Head: Normocephalic and atraumatic.  Pulmonary:     Effort: Pulmonary effort is normal.  Neurological:     Mental Status: She is alert and oriented to person, place, and time.  Psychiatric:        Attention and Perception: Attention and perception normal.        Mood and Affect: Mood is depressed. Affect is flat.        Speech: Speech normal.        Behavior: Behavior is cooperative.  Thought Content: Thought content includes suicidal ideation. Thought content includes suicidal plan.        Cognition and Memory: Cognition normal.        Judgment: Judgment is inappropriate.     Review of Systems   Constitutional: Positive for appetite change.  Cardiovascular: Positive for palpitations.  Gastrointestinal: Positive for nausea.  Musculoskeletal: Positive for gait problem.  Neurological: Positive for headaches.  Psychiatric/Behavioral: Positive for dysphoric mood and suicidal ideas.    Blood pressure 124/84, pulse (!) 107, temperature 98.4 F (36.9 C), temperature source Oral, resp. rate 18, height 5\' 4"  (1.626 m), weight 65.8 kg, SpO2 98 %.Body mass index is 24.9 kg/m.  General Appearance: Casual  Eye Contact:  Fair  Speech:  Normal Rate  Volume:  Normal  Mood:  Depressed, Dysphoric, Hopeless and Worthless  Affect:  Restricted  Thought Process:  Linear and Descriptions of Associations: Intact  Orientation:  Full (Time, Place, and Person)  Thought Content:  NA  Suicidal Thoughts:  Yes.  with intent/plan  Homicidal Thoughts:  No  Memory:  Immediate;   Good Recent;   Good  Judgement:  Impaired  Insight:  Lacking  Psychomotor Activity:  Decreased  Concentration:  Concentration: Good and Attention Span: Good  Recall:  Good  Fund of Knowledge:  Good  Language:  Good  Akathisia:  Negative  Handed:  Right  AIMS (if indicated):     Assets:  Resilience  ADL's:  Intact  Cognition:  WNL  Sleep:  Number of Hours: 6.5   Assessment: Patient is a 54 y.o. female with no past psychiatric history who initially presented via EMS to [facility] after calling in the morning after she attempted suicide. Patient broke picture frame and caused injury to self bilateral wrist and abrasion to neck. Pt is admitted for stabilization of her symptoms.  Today patient admits to suicidal ideation with  A plan to jump over the bridge after leaving from here, denies homicidal ideation, denies AVH. She endorses depressed mood, anhedonia, hopelessness, worthlessness, SI with plan, low energy, difficulty sleeping.She is seen in day room engaging with other patients.  Treatment Plan Summary: Daily contact with  patient to assess and evaluate symptoms and progress in treatment.   1. Incraese Zoloft to 200 mg for depression and include 100 now. 2. Continue Ativan 1 mg Q6h PRN for withdrawal symptoms. 3. Continue Hydrochlorothiazide 12.5 mg for blood pressure control. 4. Continue Vistaril 25 mg PO TID PRN for anxiety. 5. Continue Trazodone 50 mg PO QHS PRN for insomnia. 6. Encourgement to attend group therapies. 7. Disposition in progress.  [facility], MD 04/27/2020, 2:31 PM

## 2020-04-27 NOTE — Progress Notes (Signed)
On approach, the pt presented with an irritable mood. She was guarded during the assessment and forwarded little information. She endorsed suicidal thoughts with no plan or intent. She verbally contracts for safety. Per pt self inventory sheet: she rates sleep as poor, depression 9/10, hopelessness 9/10 and anxiety 6/10 (10 being worst). She was unable to verbalize a possible discharge plan and appeared to have minimal insight regarding a discharge plan.   Orders reviewed with pt. Vital signs reviewed. Verbal support provided. 15 minute checks performed for safety.  Pt compliant with tx plan and denies any medication side effects.

## 2020-04-27 NOTE — Progress Notes (Signed)
°   04/27/20 2120  Psych Admission Type (Psych Patients Only)  Admission Status Voluntary  Psychosocial Assessment  Patient Complaints Depression;Anxiety  Eye Contact Fair  Facial Expression Animated  Affect Depressed  Speech Logical/coherent  Interaction Forwards little  Motor Activity Slow;Unsteady  Appearance/Hygiene Unremarkable  Behavior Characteristics Cooperative;Anxious  Mood Depressed;Anxious  Thought Process  Coherency WDL  Content WDL  Delusions None reported or observed  Perception WDL  Hallucination None reported or observed  Judgment Poor  Confusion None  Danger to Self  Current suicidal ideation? Passive  Self-Injurious Behavior Some self-injurious ideation observed or expressed.  No lethal plan expressed   Agreement Not to Harm Self Yes  Description of Agreement verbally contracts for safety  Danger to Others  Danger to Others None reported or observed   Pt seen at med window. Pt rates anxiety 4/10 and depression 7/10. Pt endorses passive SI, "I was wondering if that mirror in the bathroom was made of glass." Pt contracts for safety and agrees to approach staff. Pt forwards very little.

## 2020-04-27 NOTE — Progress Notes (Signed)
Date:  04/27/2020 Time:  8:49 PM   Group Topic/Focus:  Goals Group:   The focus of this group is to help patients, review daily goals to achieve during treatment, and discuss how the patient can incorporate goal setting into their daily lives to aide in recovery. This group also aides in reassessing group goals and modifying them in case the goal set was not a good fit.   Participation Level:  Active   Participation Quality:  Appropriate   Affect:  Appropriate   Cognitive:  Appropriate   Insight: Appropriate   Engagement in Group:  Engaged   Modes of Intervention:  Discussion   Additional Comments:  Patient attended goal review and wrap up in Recovery Group and participated. Patient rated today a 2.   Virginia Hess 04/27/2020, 8:30 PM

## 2020-04-28 MED ORDER — ARIPIPRAZOLE 5 MG PO TABS
5.0000 mg | ORAL_TABLET | Freq: Every day | ORAL | Status: DC
  Administered 2020-04-28 – 2020-05-05 (×8): 5 mg via ORAL
  Filled 2020-04-28 (×9): qty 1
  Filled 2020-04-28 (×2): qty 7
  Filled 2020-04-28: qty 1

## 2020-04-28 NOTE — Progress Notes (Signed)
ADULT GRIEF GROUP NOTE:  Spiritual care group on grief and loss facilitated by chaplain Burnis Kingfisher MDiv, [facility]  Group Goal:  Support / Education around grief and loss Members engage in facilitated group support and psycho-social education.  Group Description:  Following introductions and group rules, group members engaged in facilitated group dialog and support around topic of loss, with particular support around experiences of loss in their lives. Group Identified types of loss (relationships / self / things) and identified patterns, circumstances, and changes that precipitate losses. Reflected on thoughts / feelings around loss, normalized grief responses, and recognized variety in grief experience.   Group noted Worden's four tasks of grief in discussion.  Group drew on Adlerian / Rogerian, narrative, MI, Patient Progress:  PT invited.  DID NOT ATTEND

## 2020-04-28 NOTE — [facility] Group Notes (Signed)
Occupational Therapy Group Note Date: 04/28/2020 Group Topic/Focus: Stages of Change  2:00 - 2:40 PM  Group Description: Group encouraged increased engagement and participation through discussion focused on Stages of Change. Education provided on the five stages of stage including pre-contemplation, contemplation, preparation, action, maintenance, and relapse. Patients were encouraged to identify one behavior they would like to change as it relates to their current hospitalization (mental health and/or substance use) and identify and talk through which stage they are currently in and what steps they need to take to get to the next stage of change.  Participation Level: Active   Participation Quality: Independent   Behavior: Alert, Appropriate, Calm and Cooperative   Speech/Thought Process: Focused   Affect/Mood: Constricted   Insight: Fair   Judgement: Fair   Individualization: Nyrah appeared attentive and engaged in discussion and education provided, however declined to identify a behavior that she would like to change. She shared that she enjoys reading a good romance novel, however was otherwise guarded in sharing in group discussion. Receptive to handout received post group.   Modes of Intervention: Activity, Discussion, Education and Problem-solving  Patient Response to Interventions:  Attentive and Engaged   Plan: Continue to engage patient in OT groups 2 - 3x/week.  04/28/2020  Donne Hazel, MOT, OTR/L

## 2020-04-28 NOTE — Progress Notes (Signed)
   04/28/20 2137  Psych Admission Type (Psych Patients Only)  Admission Status Voluntary  Psychosocial Assessment  Patient Complaints Anxiety;Depression  Eye Contact Fair  Facial Expression Anxious  Affect Depressed  Speech Logical/coherent  Interaction Forwards little  Motor Activity Slow;Unsteady  Appearance/Hygiene Unremarkable  Behavior Characteristics Cooperative;Anxious  Mood Depressed;Anxious;Pleasant  Thought Process  Coherency WDL  Content WDL  Delusions None reported or observed  Perception WDL  Hallucination None reported or observed  Judgment Poor  Confusion None  Danger to Self  Current suicidal ideation? Passive  Agreement Not to Harm Self Yes  Description of Agreement verbally contracts for safety  Danger to Others  Danger to Others None reported or observed  Pt rates anxiety 5/10 and depression 9/10 today. Pt endorses passive SI "I have these thoughts everyday." Pt contracts for safety. Pt started on Abilify tonight. Pt encouraged to report any side effects she may experience from the medication.

## 2020-04-28 NOTE — Progress Notes (Signed)
Recreation Therapy Notes  Date: 8.23.21 Time: 0930 Location: 300 Hall Group Room  Group Topic: Stress Management  Goal Area(s) Addresses:  Patient will identify positive stress management techniques. Patient will identify benefits of using stress management post d/c.  Intervention: Stress Management  Activity:  Meditation.  LRT played a meditation that focused on being resilient in the face of adversity.  Patients were to listen and follow along as meditation played.    Education:  Stress Management, Discharge Planning.   Education Outcome: Acknowledges Education  Clinical Observations/Feedback: Pt did not attend group activity.    Caroll Rancher, LRT/CTRS         Caroll Rancher A 04/28/2020 11:47 AM

## 2020-04-28 NOTE — [facility] Suicide Risk Assessment (Signed)
[facility] INPATIENT:  Family/Significant Other Suicide Prevention Education    Suicide Prevention Education:  Patient Refusal for Family/Significant Other Suicide Prevention Education: The patient Virginia Hess has refused to provide written consent for family/significant other to be provided Family/Significant Other Suicide Prevention Education during admission and/or prior to discharge.  Physician notified.  SPE pamphlet placed on chart for patient to share with supports at discharge.    Ruthann Cancer MSW, LCSW Clincal Social Worker  [facility]

## 2020-04-28 NOTE — Progress Notes (Signed)
Pt stated that she continues to have suicidal thoughts.  When RN asked pt what her plan is regarding suicide, pt said, "I don't know, but I'd find a way."  RN asked pt about triggers/stressors in pt's life and pt said, "I'm tired, it's just life."  Pt could not identify specific life events/triggers to make pt want to end her life.   RN established rapport with pt and assessed for needs and concerns. RN administered medications per MD orders. RN encouraged pt to attend group sessions today.  Pt up and interacting with others in the milieu.  Pt is currently in OT group meeting.  Pt tolerated medications without incident.  RN will continue to monitor and provide support as indicated.

## 2020-04-28 NOTE — Progress Notes (Signed)
[facility] MD Progress Note  04/28/2020 3:18 PM Virginia Hess  MRN:  [id number] Subjective:   Pt states her mood is "Terrible". Pt is actively suicidal and states that she is going to attempt suicide once she will be discharged. Pt slept well last night. Nursing notes indicate that Pt slept for 8 hours. Pt states that her appetite is poor and she doesn't feel like eating much. Pt endorses depressed mood and hopelessness. Currently, Pt denies any homicidal ideation and, visual and auditory hallucination. Pt denies any headache, nausea, vomiting, dizziness, chest pain, SOB, abdominal pain, diarrhea, and constipation. Pt denies any medication side effects and has been tolerating it well. Pt states Zoloft is not making any difference. We discussed about the option for ECT. Pt states that she will think about it and let us know. Pt still doesn't want to talk about her family or family problems. Pt states she may be able to discuss in the evening. Pt was given opportunity to ask question. Pt denies any concerns.  On examination, Pt is less irritable than before, depressed, cooperative and oriented x3. Pt speech is slow with decreased volume. Pt's Mood is depressed, hopeless and affect is constricted. Her thought process is tangential, focused on SI. Pt is not responding to any internal stimuli. Endorses SI with a plan (Sometimes thinks of jumping from a bridge) , Denies HI , denies AVH.   Objective: Patient is a 54 y.o. female withno past psychiatric historywhoinitially [facility] after calling in themorningaftershe attempted suicide.Patient broke picture frame and caused injury to self bilateral wrist and abrasion to neck.Pt is admitted for stabilization of her symptoms.  Principal Problem: Major depressive disorder, recurrent episode, severe (HCC) Diagnosis: Principal Problem:   Major depressive disorder, recurrent episode, severe (HCC) Active Problems:   Alcohol dependence (HCC)    Hypokalemia   Hypomagnesemia   Lactic acid acidosis   Elevated LFTs   Wernicke's encephalopathy   Anemia of chronic disease   Suicidal ideation   Essential hypertension  Total Time spent with patient: 20 minutes  Past Psychiatric History: see H&P  Past Medical History:  Past Medical History:  Diagnosis Date  . Alcohol dependence (HCC)   . Depression   . Gravida 4 para 4     Past Surgical History:  Procedure Laterality Date  . NO PAST SURGERIES     Family History:  Family History  Problem Relation Age of Onset  . Hypertension Brother   . Cancer Neg Hx   . Heart disease Neg Hx   . Stroke Neg Hx    Family Psychiatric  History: see H&P Social History:  Social History   Substance and Sexual Activity  Alcohol Use Yes   Comment: 2 daily     Social History   Substance and Sexual Activity  Drug Use No    Social History   Socioeconomic History  . Marital status: Single    Spouse name: Not on file  . Number of children: Not on file  . Years of education: Not on file  . Highest education level: Not on file  Occupational History  . Not on file  Tobacco Use  . Smoking status: Current Every Day Smoker    Packs/day: 0.50    Years: 35.00    Pack years: 17.50    Types: Cigarettes  . Smokeless tobacco: Never Used  Substance and Sexual Activity  . Alcohol use: Yes    Comment: 2 daily  . Drug use: No  .  Sexual activity: Not on file  Other Topics Concern  . Not on file  Social History Narrative  . Not on file   Social Determinants of Health   Financial Resource Strain:   . Difficulty of Paying Living Expenses: Not on file  Food Insecurity:   . Worried About Programme researcher, broadcasting/film/videounning Out of Food in the Last Year: Not on file  . Ran Out of Food in the Last Year: Not on file  Transportation Needs:   . Lack of Transportation (Medical): Not on file  . Lack of Transportation (Non-Medical): Not on file  Physical Activity:   . Days of Exercise per Week: Not on file  . Minutes of  Exercise per Session: Not on file  Stress:   . Feeling of Stress : Not on file  Social Connections:   . Frequency of Communication with Friends and Family: Not on file  . Frequency of Social Gatherings with Friends and Family: Not on file  . Attends Religious Services: Not on file  . Active Member of Clubs or Organizations: Not on file  . Attends BankerClub or Organization Meetings: Not on file  . Marital Status: Not on file   Additional Social History:                         Sleep: Good  Appetite:  Fair  Current Medications: Current Facility-Administered Medications  Medication Dose Route Frequency Provider Last Rate Last Admin  . acetaminophen (TYLENOL) tablet 650 mg  650 mg Oral Q6H PRN Antonieta Pertlary, Greg Lawson, MD   650 mg at 04/26/20 1418  . albuterol (VENTOLIN HFA) 108 (90 Base) MCG/ACT inhaler 2 puff  2 puff Inhalation Q4H PRN Mariel CraftMaurer, Sheila M, MD      . alum & mag hydroxide-simeth (MAALOX/MYLANTA) 200-200-20 MG/5ML suspension 30 mL  30 mL Oral Q4H PRN Antonieta Pertlary, Greg Lawson, MD      . folic acid (FOLVITE) tablet 1 mg  1 mg Oral Daily Antonieta Pertlary, Greg Lawson, MD   1 mg at 04/28/20 [id number]  . hydrochlorothiazide (MICROZIDE) capsule 12.5 mg  12.5 mg Oral Daily Dagar, Anjali, MD   12.5 mg at 04/27/20 [id number]  . hydrOXYzine (ATARAX/VISTARIL) tablet 25 mg  25 mg Oral TID PRN Antonieta Pertlary, Greg Lawson, MD   25 mg at 04/28/20 0914  . LORazepam (ATIVAN) tablet 1 mg  1 mg Oral Q6H PRN Antonieta Pertlary, Greg Lawson, MD      . magnesium hydroxide (MILK OF MAGNESIA) suspension 30 mL  30 mL Oral Daily PRN Antonieta Pertlary, Greg Lawson, MD      . sertraline (ZOLOFT) tablet 200 mg  200 mg Oral Daily Dagar, Geralynn RileAnjali, MD   200 mg at 04/28/20 0905  . thiamine tablet 100 mg  100 mg Oral Daily Dagar, Anjali, MD   100 mg at 04/28/20 0905  . traZODone (DESYREL) tablet 100 mg  100 mg Oral QHS PRN Dagar, Geralynn RileAnjali, MD   100 mg at 04/27/20 2119    Lab Results: No results found for this or any previous visit (from the past 48 hour(s)).  Blood  Alcohol level:  Lab Results  Component Value Date   ETH <10 04/22/2020   ETH 176 (H) 04/14/2020    Metabolic Disorder Labs: No results found for: HGBA1C, MPG No results found for: PROLACTIN No results found for: CHOL, TRIG, HDL, CHOLHDL, VLDL, LDLCALC  Physical Findings: AIMS: Facial and Oral Movements Muscles of Facial Expression: None, normal Lips and Perioral Area: None, normal Jaw: None,  normal Tongue: None, normal,Extremity Movements Upper (arms, wrists, hands, fingers): None, normal Lower (legs, knees, ankles, toes): None, normal, Trunk Movements Neck, shoulders, hips: None, normal, Overall Severity Severity of abnormal movements (highest score from questions above): None, normal Incapacitation due to abnormal movements: None, normal Patient's awareness of abnormal movements (rate only patient's report): No Awareness, Dental Status Current problems with teeth and/or dentures?: No Does patient usually wear dentures?: No  CIWA:  CIWA-Ar Total: 2 COWS:     Musculoskeletal: Strength & Muscle Tone: within normal limits Gait & Station: normal Patient leans: N/A  Psychiatric Specialty Exam: Physical Exam Vitals and nursing note reviewed.  Constitutional:      General: She is not in acute distress.    Appearance: Normal appearance. She is not ill-appearing, toxic-appearing or diaphoretic.  HENT:     Head: Normocephalic and atraumatic.  Pulmonary:     Effort: Pulmonary effort is normal.  Neurological:     Mental Status: She is alert.     Review of Systems  Constitutional: Positive for appetite change. Negative for diaphoresis.  Eyes: Negative for visual disturbance.  Respiratory: Negative for chest tightness and shortness of breath.   Cardiovascular: Negative for chest pain.  Gastrointestinal: Negative for abdominal pain, constipation, nausea and vomiting.  Neurological: Negative for dizziness, light-headedness and headaches.  Psychiatric/Behavioral: Positive for  dysphoric mood and suicidal ideas. Negative for agitation and hallucinations.    Blood pressure 126/82, pulse (!) 115, temperature 98.2 F (36.8 C), temperature source Oral, resp. rate 18, height 5\' 4"  (1.626 m), weight 65.8 kg, SpO2 98 %.Body mass index is 24.9 kg/m.  General Appearance: Casual  Eye Contact:  Fair  Speech:  Normal Rate  Volume:  Normal  Mood:  Depressed, Hopeless and Worthless  Affect:  Constricted  Thought Process:  Linear  Orientation:  Full (Time, Place, and Person)  Thought Content:  Delusions and Hallucinations: None  Suicidal Thoughts:  Yes.  with intent/plan  Homicidal Thoughts:  No  Memory:  Immediate;   Good Recent;   Good Remote;   Good  Judgement:  Impaired  Insight:  Lacking  Psychomotor Activity:  Decreased  Concentration:  Concentration: Good and Attention Span: Good  Recall:  Good  Fund of Knowledge:  Good  Language:  Good  Akathisia:  Negative  Handed:  Right  AIMS (if indicated):     Assets:  Resilience  ADL's:  Intact  Cognition:  WNL  Sleep:  Number of Hours: 8     Treatment Plan Summary:Patient is a 54 y.o. female withno past psychiatric historywhoinitially presentedvia EMS [facility] after calling in themorningaftershe attempted suicide.Patient broke picture frame and caused injury to self bilateral wrist and abrasion to neck.Pt is admitted for stabilization of her symptoms. Today, Pt is less irritable than before, depressed, cooperative and oriented x3. Pt speech is slow with decreased volume. Pt's Mood is depressed, hopeless and affect is constricted. Her thought process is tangential, focused on SI. Pt is not responding to any internal stimuli. Endorses SI with a plan (Sometimes thinks of jumping from a bridge) , Denies HI , denies AVH.  BP- 126/82 mmHg, PR- 115/min TSH-1.295 Zoloft was increase to 200 mg Today. We will consider adding Abilify.  Plan: Daily contact with patient to assess and evaluate symptoms and progress in  treatment 1. Continue Zoloft to 200 mg for depression. 2. Continue Ativan 1 mg Q6h PRN for withdrawal symptoms. 3. Continue Hydrochlorothiazide 12.5 mg for blood pressure control.   4. Continue Vistaril  25 mg PO TID PRN for anxiety. 5. Continue Trazodone 100 mg PO QHS PRN for insomnia. 6. Encourgement to attend group therapies. 7. We will consider ECT if Pt agrees to it. 8. Thiamine 100 mg Daily 9. Folic acid 1 mg Daily 10. Albuterol inhaler 2 puffs Q 4H PRN 11. Monitor SI, Vitals, and medication side effects. 12. We will consider adding Abilify for augmentation.  Karsten Ro, MD 04/28/2020, 3:18 PM

## 2020-04-29 NOTE — Progress Notes (Signed)
The patient rated her day as a 5 out of 10 since she felt tired due to her medication. Her goal for tomorrow is to read more.

## 2020-04-29 NOTE — Progress Notes (Addendum)
Pt said that her day has been "alright." Pt continues to be passively suicidal without a plan and verbally contracts for safety. She's been in the dayroom tonight and has been pleasant upon interactions. She attended group tonight and shared that her goal is to read more tomorrow. Pt doesn't voice any further concerns at this time. Active listening, reassurance, and support provided. Pt denies HI and AVH. Medications administered as ordered by MD, no adverse effects noted. Q 15 min safety checks continue. Pt's safety has been maintained.     04/29/20 2100  Psych Admission Type (Psych Patients Only)  Admission Status Voluntary  Psychosocial Assessment  Patient Complaints Anxiety;Depression  Eye Contact Fair  Facial Expression Flat  Affect Depressed;Anxious;Appropriate to circumstance  Speech Logical/coherent  Interaction Forwards little;Guarded  Motor Activity Slow;Unsteady  Appearance/Hygiene Unremarkable  Behavior Characteristics Cooperative;Appropriate to situation;Anxious;Calm  Mood Depressed;Anxious;Pleasant  Thought Process  Coherency WDL  Content WDL  Delusions None reported or observed  Perception WDL  Hallucination None reported or observed  Judgment Poor  Confusion None  Danger to Self  Current suicidal ideation? Passive  Self-Injurious Behavior No self-injurious ideation or behavior indicators observed or expressed   Agreement Not to Harm Self Yes  Description of Agreement verbally contracts for safety  Danger to Others  Danger to Others None reported or observed

## 2020-04-29 NOTE — Progress Notes (Signed)
Recreation Therapy Notes ° °Animal-Assisted Activity (AAA) Program Checklist/Progress Notes °Patient Eligibility Criteria Checklist & Daily Group note for Rec Tx Intervention ° °Date: 8.24.21 °Time: 1430 °Location: [address] Group Room  ° °AAA/T Program Assumption of Risk Form signed by Patient/ or Parent Legal Guardian  YES ° °Patient is free of allergies or sever asthma  YES  ° °Patient reports no fear of animals YES  ° °Patient reports no history of cruelty to animals  YES ° °Patient understands his/her participation is voluntary YES ° °Patient washes hands before animal contact  YES  ° °Patient washes hands after animal contact  YES  ° °Education: Hand Washing, Appropriate Animal Interaction  ° °Education Outcome: Acknowledges understanding/In group clarification offered/Needs additional education.  ° °Clinical Observations/Feedback:  Pt did not attend. ° ° ° °Cree Napoli, LRT/CTRS ° ° ° ° ° ° ° ° °Jodi Kappes A °04/29/2020 3:38 PM °

## 2020-04-29 NOTE — Progress Notes (Signed)
   04/29/20 2100  COVID-19 Daily Checkoff  Have you had a fever (temp > 37.80C/100F)  in the past 24 hours?  No  COVID-19 EXPOSURE  Have you traveled outside the state in the past 14 days? No  Have you been in contact with someone with a confirmed diagnosis of COVID-19 or PUI in the past 14 days without wearing appropriate PPE? No  Have you been living in the same home as a person with confirmed diagnosis of COVID-19 or a PUI (household contact)? No  Have you been diagnosed with COVID-19? No

## 2020-04-29 NOTE — Progress Notes (Signed)
[facility] MD Progress Note  04/29/2020 9:27 AM Virginia Hess  MRN:  [id number] Subjective:  Pt is seen and examined today. Pt is still suicidal and states she is thinking about going home and electrocuting herself by putting a hair dryer in the bath tub. Pt states her mood is "wishy washy:" and she feels depressed. She doesn't want to talk about her family and family problems. We talked about writing thoughts on a piece of paper. She said she will write all her thoughts and talk to [place] tomorrow. We also talked about ECT as an option to alleviate her depression and suicidal thoughts. Pt states she wants to know more about the process. Pt states she will think about that when she have all the information. Pt states she slept well last night. Nursing notes indicate that Pt slept for 6.25 hours. Pt states she was feeling nausea yesterday but today she feels better. Pt states she feels lightheadedness sometimes when she stands up. Pt denies any headache, nausea, vomiting, chest pain, SOB, abdominal pain, diarrhea, and constipation. Pt denies any medication side effects and has been tolerating it well Pt was given opportunity to ask question. Pt denies any concerns. We added Abilify 5 mg yesterday for augmentation.  On examination, Pt is still depressed, cooperative and oriented x3. Pt speech is slow with decreased volume. Pt's Mood is depressed and affect is still constricted but brighter than before. Her thought process is tangential, focused on SI. Pt is not responding to any internal stimuli. Endorses SI with a plan (Thinking about going home and electrocuting herself by putting a hair dryer in the bath tub) , Denies HI , denies AVH.   Objective : Patient is a 54 y.o. female withno past psychiatric historywhoinitially [facility]via [facility] after calling in themorningaftershe attempted suicide.Patient broke picture frame and caused injury to self bilateral wrist and abrasion to neck.Pt is admitted for  stabilization of her symptoms.  Principal Problem: Major depressive disorder, recurrent episode, severe (HCC) Diagnosis: Principal Problem:   Major depressive disorder, recurrent episode, severe (HCC) Active Problems:   Alcohol dependence (HCC)   Hypokalemia   Hypomagnesemia   Lactic acid acidosis   Elevated LFTs   Wernicke's encephalopathy   Anemia of chronic disease   Suicidal ideation   Essential hypertension  Total Time spent with patient: 20 minutes  Past Psychiatric History: see H&P  Past Medical History:  Past Medical History:  Diagnosis Date  . Alcohol dependence (HCC)   . Depression   . Gravida 4 para 4     Past Surgical History:  Procedure Laterality Date  . NO PAST SURGERIES     Family History:  Family History  Problem Relation Age of Onset  . Hypertension Brother   . Cancer Neg Hx   . Heart disease Neg Hx   . Stroke Neg Hx    Family Psychiatric  History: see H&P Social History:  Social History   Substance and Sexual Activity  Alcohol Use Yes   Comment: 2 daily     Social History   Substance and Sexual Activity  Drug Use No    Social History   Socioeconomic History  . Marital status: Single    Spouse name: Not on file  . Number of children: Not on file  . Years of education: Not on file  . Highest education level: Not on file  Occupational History  . Not on file  Tobacco Use  . Smoking status: Current Every Day Smoker  Packs/day: 0.50    Years: 35.00    Pack years: 17.50    Types: Cigarettes  . Smokeless tobacco: Never Used  Substance and Sexual Activity  . Alcohol use: Yes    Comment: 2 daily  . Drug use: No  . Sexual activity: Not on file  Other Topics Concern  . Not on file  Social History Narrative  . Not on file   Social Determinants of Health   Financial Resource Strain:   . Difficulty of Paying Living Expenses: Not on file  Food Insecurity:   . Worried About Programme researcher, broadcasting/film/video in the Last Year: Not on file  .  Ran Out of Food in the Last Year: Not on file  Transportation Needs:   . Lack of Transportation (Medical): Not on file  . Lack of Transportation (Non-Medical): Not on file  Physical Activity:   . Days of Exercise per Week: Not on file  . Minutes of Exercise per Session: Not on file  Stress:   . Feeling of Stress : Not on file  Social Connections:   . Frequency of Communication with Friends and Family: Not on file  . Frequency of Social Gatherings with Friends and Family: Not on file  . Attends Religious Services: Not on file  . Active Member of Clubs or Organizations: Not on file  . Attends Banker Meetings: Not on file  . Marital Status: Not on file   Additional Social History:                         Sleep: Good  Appetite:  Fair  Current Medications: Current Facility-Administered Medications  Medication Dose Route Frequency Provider Last Rate Last Admin  . acetaminophen (TYLENOL) tablet 650 mg  650 mg Oral Q6H PRN Antonieta Pert, MD   650 mg at 04/28/20 2137  . albuterol (VENTOLIN HFA) 108 (90 Base) MCG/ACT inhaler 2 puff  2 puff Inhalation Q4H PRN Mariel Craft, MD      . alum & mag hydroxide-simeth (MAALOX/MYLANTA) 200-200-20 MG/5ML suspension 30 mL  30 mL Oral Q4H PRN Antonieta Pert, MD      . ARIPiprazole (ABILIFY) tablet 5 mg  5 mg Oral QHS Antonieta Pert, MD   5 mg at 04/28/20 2136  . folic acid (FOLVITE) tablet 1 mg  1 mg Oral Daily Antonieta Pert, MD   1 mg at 04/29/20 0820  . hydrochlorothiazide (MICROZIDE) capsule 12.5 mg  12.5 mg Oral Daily Dagar, Anjali, MD   12.5 mg at 04/27/20 6010  . hydrOXYzine (ATARAX/VISTARIL) tablet 25 mg  25 mg Oral TID PRN Antonieta Pert, MD   25 mg at 04/28/20 2136  . LORazepam (ATIVAN) tablet 1 mg  1 mg Oral Q6H PRN Antonieta Pert, MD      . magnesium hydroxide (MILK OF MAGNESIA) suspension 30 mL  30 mL Oral Daily PRN Antonieta Pert, MD      . sertraline (ZOLOFT) tablet 200 mg  200 mg  Oral Daily Dagar, Geralynn Rile, MD   200 mg at 04/29/20 0820  . thiamine tablet 100 mg  100 mg Oral Daily Dagar, Anjali, MD   100 mg at 04/29/20 0820  . traZODone (DESYREL) tablet 100 mg  100 mg Oral QHS PRN Dagar, Geralynn Rile, MD   100 mg at 04/28/20 2136    Lab Results: No results found for this or any previous visit (from the past 48 hour(s)).  Blood Alcohol level:  Lab Results  Component Value Date   ETH <10 04/22/2020   ETH 176 (H) 04/14/2020    Metabolic Disorder Labs: No results found for: HGBA1C, MPG No results found for: PROLACTIN No results found for: CHOL, TRIG, HDL, CHOLHDL, VLDL, LDLCALC  Physical Findings: AIMS: Facial and Oral Movements Muscles of Facial Expression: None, normal Lips and Perioral Area: None, normal Jaw: None, normal Tongue: None, normal,Extremity Movements Upper (arms, wrists, hands, fingers): None, normal Lower (legs, knees, ankles, toes): None, normal, Trunk Movements Neck, shoulders, hips: None, normal, Overall Severity Severity of abnormal movements (highest score from questions above): None, normal Incapacitation due to abnormal movements: None, normal Patient's awareness of abnormal movements (rate only patient's report): No Awareness, Dental Status Current problems with teeth and/or dentures?: No Does patient usually wear dentures?: No  CIWA:  CIWA-Ar Total: 2 COWS:     Musculoskeletal: Strength & Muscle Tone: within normal limits Gait & Station: unsteady Patient leans: N/A  Psychiatric Specialty Exam: Physical Exam Vitals and nursing note reviewed.  Constitutional:      General: She is not in acute distress.    Appearance: Normal appearance. She is not ill-appearing, toxic-appearing or diaphoretic.  HENT:     Head: Normocephalic and atraumatic.  Pulmonary:     Effort: Pulmonary effort is normal.  Neurological:     Mental Status: She is alert.     Review of Systems  Constitutional: Positive for appetite change. Negative for fever.   Eyes: Negative for visual disturbance.  Respiratory: Negative for apnea, chest tightness and shortness of breath.   Cardiovascular: Negative for chest pain and palpitations.  Gastrointestinal: Negative for abdominal pain, constipation, diarrhea, nausea and vomiting.  Neurological: Positive for light-headedness.  Psychiatric/Behavioral: Positive for dysphoric mood and suicidal ideas. Negative for agitation and confusion.    Blood pressure 109/70, pulse 89, temperature 98.5 F (36.9 C), temperature source Oral, resp. rate 18, height 5\' 4"  (1.626 m), weight 65.8 kg, SpO2 98 %.Body mass index is 24.9 kg/m.  General Appearance: Casual  Eye Contact:  Fair  Speech:  Normal Rate  Volume:  Decreased  Mood:  Depressed  Affect:  Constricted  Thought Process:  Linear  Orientation:  Full (Time, Place, and Person)  Thought Content:  Delusions and Hallucinations: None  Suicidal Thoughts:  Yes.  with intent/plan  Homicidal Thoughts:  No  Memory:  Immediate;   Good Recent;   Good Remote;   Good  Judgement:  Impaired  Insight:  Lacking  Psychomotor Activity:  Decreased  Concentration:  Concentration: Good and Attention Span: Good  Recall:  Good  Fund of Knowledge:  Good  Language:  Good  Akathisia:  Negative  Handed:  Right  AIMS (if indicated):     Assets:  Resilience  ADL's:  Intact  Cognition:  WNL  Sleep:  Number of Hours: 6.25     Treatment Plan Summary:Objective : Patient is a 54 y.o. female withno past psychiatric historywhoinitially [facility] after calling in themorningaftershe attempted suicide.Patient broke picture frame and caused injury to self bilateral wrist and abrasion to neck.Pt is admitted for stabilization of her symptoms. Today, Pt is still depressed, cooperative and oriented x3. Pt speech is slow with decreased volume. Pt's Mood is depressed and affect is still constricted but brighter than before. Her thought process is tangential, focused on  SI. Pt is not responding to any internal stimuli. Endorses SI with a plan (Thinking about going home and electrocuting herself by putting  a hair dryer in the bath tub) , Denies HI , denies AVH.  BP- 109/70 mmHg, PR- 89/min Abilify was added yesterday for augmentation. Pt blood pressure was very low in the morning (97/65 mmHg , Hydrochlorothiazide was held due to low BP) Plan-  Daily contact with patient to assess and evaluate symptoms and progress in treatment  1.ContinueZoloft to 200 mg for depression. 2. Continue Abilify 5 mg Daily. 3. Continue Ativan 1 mg Q6h PRN for withdrawal symptoms. 4.ContinueHydrochlorothiazide 12.5 mg for blood pressure control (Hold medication If BP is low) 5. Continue Vistaril 25 mg PO TID PRN for anxiety. 6. Continue Trazodone 100 mg PO QHS PRN for insomnia. 7. Encourgement to attend group therapies. 8. Pt was given Pt education handout about ECT. We will consider ECT if Pt agrees to it.  9. Thiamine 100 mg Daily 10. Folic acid 1 mg Daily 11. Albuterol inhaler 2 puffs Q 4H PRN 12. Monitor SI, Vitals, and medication side effects.   Karsten Ro, MD 04/29/2020, 9:27 AM

## 2020-04-29 NOTE — Progress Notes (Signed)
Pt presents with anxiety and depression.  Pt continues to endorse suicidal thoughts, but has no specific suicide plan.  Pt contracts for safety on the unit.  Pt has questions about ECT and RN encouraged pt to talk to MD about this procedure.  Pt in agreement with this plan. Pt is pleasant on approach and speaks in soft tone. Pt tolerated medications without incident.  BP medicine held per Dr. Coralyn Helling instruction as pt's BP has lowered since pt started on HCTZ.  RN provided pt with emotional support and reassurance. Q 15 min safety checks remain in place. RN will continue to monitor and provide assistance as needed.

## 2020-04-30 NOTE — Progress Notes (Signed)
[facility] MD Progress Note  04/30/2020 11:32 AM Virginia Hess  MRN:  [id number]   Subjective: Patient states " I am suicidal and after lots of thinking my best plan would be to jump off the bridge". She states she is depressed-sad mood. She states " I don't know why I feel this way, I am just so sad". She states" I just want to be left alone, I am extremely sad". She states she slept alright last night .She states she ate a portion of her breakfast this morning. She denies any headache or dizziness. She denies any homicidal ideations. She denies any auditory or visual hallucinations. She states she does not want to do anything with her family or so called friends.  She states thinking about family or friends make her angry & upset. She states she does not have a place to go after discharge, she was living in the motel but will figure out something " I would not need a place to stay because I am planning to die after going out of here".  Objective: Patient is seen and examined. She was lying in bed, looked dysphoric, responded to questions but not engaged. She is oriented*3, does not seem like responding to the internal stimuli. She feels  Hopeless, worthless.She is again counseled about value of life and how suicide in family can impact children, grandchildren and family members. She shows poor insight and judgement about it and thinks no one would care if she dies. She is edcuctaed about  Electroconvulsive therapy. She asked provider to read the paper with information and was engaged appropriately. She as informed that it would not be just one session but may be more and patient started thinking a lot about it and asked provider to give her some time to think.Her blood pressure is Blood pressure 94/69, pulse (!) 131, temperature 98.4 F (36.9 C), temperature source Oral, resp. rate 18. She slept 6 hours yesterday. According to notes - patient did not attend one group therapy yesterday but attended another one.  She looks more withdrawn today.   .Principal Problem: Major depressive disorder, recurrent episode, severe (HCC) Diagnosis: Principal Problem:   Major depressive disorder, recurrent episode, severe (HCC) Active Problems:   Alcohol dependence (HCC)   Hypokalemia   Hypomagnesemia   Lactic acid acidosis   Elevated LFTs   Wernicke's encephalopathy   Anemia of chronic disease   Suicidal ideation   Essential hypertension  Total Time spent with patient: 35 minutes  Past Psychiatric History: See H & P  Past Medical History:  Past Medical History:  Diagnosis Date  . Alcohol dependence (HCC)   . Depression   . Gravida 4 para 4     Past Surgical History:  Procedure Laterality Date  . NO PAST SURGERIES     Family History:  Family History  Problem Relation Age of Onset  . Hypertension Brother   . Cancer Neg Hx   . Heart disease Neg Hx   . Stroke Neg Hx    Family Psychiatric  History: See H & P Social History:  Social History   Substance and Sexual Activity  Alcohol Use Yes   Comment: 2 daily     Social History   Substance and Sexual Activity  Drug Use No    Social History   Socioeconomic History  . Marital status: Single    Spouse name: Not on file  . Number of children: Not on file  . Years of education: Not on file  .  Highest education level: Not on file  Occupational History  . Not on file  Tobacco Use  . Smoking status: Current Every Day Smoker    Packs/day: 0.50    Years: 35.00    Pack years: 17.50    Types: Cigarettes  . Smokeless tobacco: Never Used  Substance and Sexual Activity  . Alcohol use: Yes    Comment: 2 daily  . Drug use: No  . Sexual activity: Not on file  Other Topics Concern  . Not on file  Social History Narrative  . Not on file   Social Determinants of Health   Financial Resource Strain:   . Difficulty of Paying Living Expenses: Not on file  Food Insecurity:   . Worried About Programme researcher, broadcasting/film/videounning Out of Food in the Last Year: Not on  file  . Ran Out of Food in the Last Year: Not on file  Transportation Needs:   . Lack of Transportation (Medical): Not on file  . Lack of Transportation (Non-Medical): Not on file  Physical Activity:   . Days of Exercise per Week: Not on file  . Minutes of Exercise per Session: Not on file  Stress:   . Feeling of Stress : Not on file  Social Connections:   . Frequency of Communication with Friends and Family: Not on file  . Frequency of Social Gatherings with Friends and Family: Not on file  . Attends Religious Services: Not on file  . Active Member of Clubs or Organizations: Not on file  . Attends BankerClub or Organization Meetings: Not on file  . Marital Status: Not on file   Additional Social History:                         Sleep: Fair  Appetite:  Fair  Current Medications: Current Facility-Administered Medications  Medication Dose Route Frequency Provider Last Rate Last Admin  . acetaminophen (TYLENOL) tablet 650 mg  650 mg Oral Q6H PRN Antonieta Pertlary, Greg Lawson, MD   650 mg at 04/28/20 2137  . albuterol (VENTOLIN HFA) 108 (90 Base) MCG/ACT inhaler 2 puff  2 puff Inhalation Q4H PRN Mariel CraftMaurer, Sheila M, MD      . alum & mag hydroxide-simeth (MAALOX/MYLANTA) 200-200-20 MG/5ML suspension 30 mL  30 mL Oral Q4H PRN Antonieta Pertlary, Greg Lawson, MD      . ARIPiprazole (ABILIFY) tablet 5 mg  5 mg Oral QHS Antonieta Pertlary, Greg Lawson, MD   5 mg at 04/29/20 2102  . folic acid (FOLVITE) tablet 1 mg  1 mg Oral Daily Antonieta Pertlary, Greg Lawson, MD   1 mg at 04/30/20 16100824  . hydrochlorothiazide (MICROZIDE) capsule 12.5 mg  12.5 mg Oral Daily Nahomy Limburg, MD   12.5 mg at 04/27/20 [id number]  . hydrOXYzine (ATARAX/VISTARIL) tablet 25 mg  25 mg Oral TID PRN Antonieta Pertlary, Greg Lawson, MD   25 mg at 04/29/20 2102  . LORazepam (ATIVAN) tablet 1 mg  1 mg Oral Q6H PRN Antonieta Pertlary, Greg Lawson, MD      . magnesium hydroxide (MILK OF MAGNESIA) suspension 30 mL  30 mL Oral Daily PRN Antonieta Pertlary, Greg Lawson, MD      . sertraline (ZOLOFT) tablet 200 mg   200 mg Oral Daily Kaitlan Bin, Geralynn RileAnjali, MD   200 mg at 04/30/20 0824  . thiamine tablet 100 mg  100 mg Oral Daily Armani Brar, Geralynn RileAnjali, MD   100 mg at 04/30/20 0825  . traZODone (DESYREL) tablet 100 mg  100 mg Oral QHS PRN Gaylene Moylan, Geralynn RileAnjali,  MD   100 mg at 04/29/20 2102    Lab Results:  No results found for this or any previous visit (from the past 48 hour(s)).  Blood Alcohol level:  Lab Results  Component Value Date   ETH <10 04/22/2020   ETH 176 (H) 04/14/2020    Metabolic Disorder Labs: No results found for: HGBA1C, MPG No results found for: PROLACTIN No results found for: CHOL, TRIG, HDL, CHOLHDL, VLDL, LDLCALC  Physical Findings: AIMS: Facial and Oral Movements Muscles of Facial Expression: None, normal Lips and Perioral Area: None, normal Jaw: None, normal Tongue: None, normal,Extremity Movements Upper (arms, wrists, hands, fingers): None, normal Lower (legs, knees, ankles, toes): None, normal, Trunk Movements Neck, shoulders, hips: None, normal, Overall Severity Severity of abnormal movements (highest score from questions above): None, normal Incapacitation due to abnormal movements: None, normal Patient's awareness of abnormal movements (rate only patient's report): No Awareness, Dental Status Current problems with teeth and/or dentures?: No Does patient usually wear dentures?: No  CIWA:  CIWA-Ar Total: 2 COWS:     Musculoskeletal: Strength & Muscle Tone: abnormal Gait & Station: unsteady Patient leans: Front  Psychiatric Specialty Exam: Physical Exam Vitals and nursing note reviewed.  Constitutional:      General: She is not in acute distress.    Appearance: Normal appearance. She is not ill-appearing, toxic-appearing or diaphoretic.  HENT:     Head: Normocephalic and atraumatic.  Pulmonary:     Effort: Pulmonary effort is normal.  Neurological:     Mental Status: She is alert and oriented to person, place, and time.  Psychiatric:        Attention and Perception:  Attention and perception normal.        Mood and Affect: Mood is depressed. Affect is flat.        Speech: Speech normal.        Behavior: Behavior is cooperative.        Thought Content: Thought content includes suicidal ideation. Thought content includes suicidal plan.        Cognition and Memory: Cognition normal.        Judgment: Judgment is inappropriate.     Review of Systems  Constitutional: Positive for appetite change.  Cardiovascular: Positive for palpitations.  Gastrointestinal: Positive for nausea.  Musculoskeletal: Positive for gait problem.  Neurological: Positive for headaches.  Psychiatric/Behavioral: Positive for dysphoric mood and suicidal ideas.    Blood pressure 94/69, pulse (!) 131, temperature 98.4 F (36.9 C), temperature source Oral, resp. rate 18, height 5\' 4"  (1.626 m), weight 65.8 kg, SpO2 98 %.Body mass index is 24.9 kg/m.  General Appearance: Casual  Eye Contact:  Fair  Speech:  Normal Rate  Volume:  Normal  Mood:  Depressed, Dysphoric, Hopeless and Worthless  Affect:  Restricted  Thought Process:  Linear and Descriptions of Associations: Intact  Orientation:  Full (Time, Place, and Person)  Thought Content:  NA  Suicidal Thoughts:  Yes.  with intent/plan  Homicidal Thoughts:  No  Memory:  Immediate;   Good Recent;   Good  Judgement:  Impaired  Insight:  Lacking  Psychomotor Activity:  Decreased  Concentration:  Concentration: Good and Attention Span: Good  Recall:  Good  Fund of Knowledge:  Good  Language:  Good  Akathisia:  Negative  Handed:  Right  AIMS (if indicated):     Assets:  Resilience  ADL's:  Intact  Cognition:  WNL  Sleep:  Number of Hours: 6   Assessment: Patient is a  54 y.o. female with no past psychiatric history who initially presented via EMS to [facility] after calling in the morning after she attempted suicide. Patient broke picture frame and caused injury to self bilateral wrist and abrasion to neck. Pt is admitted for  stabilization of her symptoms.  Today patient admits to suicidal ideation with a plan to jump over the bridge after leaving from here, denies homicidal ideation, denies AVH. She endorses depressed mood, anhedonia, hopelessness, worthlessness, SI with plan, low energy.She looks withdrawn and plans to stay in her bed today. Patient is counseled about Electroconvulsive Therapy and shows her understanding.  Treatment Plan Summary: Daily contact with patient to assess and evaluate symptoms and progress in treatment.   1. Continue Zoloft  200 mg for depression. 2. Continue Ativan 1 mg Q6h PRN for withdrawal symptoms. 3. Hold Hydrochlorothiazide 12.5 mg for blood pressure control. 4. Continue Vistaril 25 mg PO TID PRN for anxiety. 5. Continue Trazodone 50 mg PO QHS PRN for insomnia. 6. Continue Abilify 5 mg Po Q day for mood augmentation. 7. Encourgement to attend group therapies. 8. Disposition in progress.  Arnoldo Lenis, MD 04/30/2020, 11:32 AM

## 2020-04-30 NOTE — Progress Notes (Signed)
   04/30/20 2000  COVID-19 Daily Checkoff  Have you had a fever (temp > 37.80C/100F)  in the past 24 hours?  No  COVID-19 EXPOSURE  Have you traveled outside the state in the past 14 days? No  Have you been in contact with someone with a confirmed diagnosis of COVID-19 or PUI in the past 14 days without wearing appropriate PPE? No  Have you been living in the same home as a person with confirmed diagnosis of COVID-19 or a PUI (household contact)? No  Have you been diagnosed with COVID-19? No

## 2020-04-30 NOTE — Tx Team (Signed)
Interdisciplinary Treatment and Diagnostic Plan Update  04/30/2020 Time of Session: 9:40am Virginia Hess MRN: [id number]  Principal Diagnosis: Major depressive disorder, recurrent episode, severe (HCC)  Secondary Diagnoses: Principal Problem:   Major depressive disorder, recurrent episode, severe (HCC) Active Problems:   Alcohol dependence (HCC)   Hypokalemia   Hypomagnesemia   Lactic acid acidosis   Elevated LFTs   Wernicke's encephalopathy   Anemia of chronic disease   Suicidal ideation   Essential hypertension   Current Medications:  Current Facility-Administered Medications  Medication Dose Route Frequency Provider Last Rate Last Admin  . acetaminophen (TYLENOL) tablet 650 mg  650 mg Oral Q6H PRN Antonieta Pert, MD   650 mg at 04/28/20 2137  . albuterol (VENTOLIN HFA) 108 (90 Base) MCG/ACT inhaler 2 puff  2 puff Inhalation Q4H PRN Mariel Craft, MD      . alum & mag hydroxide-simeth (MAALOX/MYLANTA) 200-200-20 MG/5ML suspension 30 mL  30 mL Oral Q4H PRN Antonieta Pert, MD      . ARIPiprazole (ABILIFY) tablet 5 mg  5 mg Oral QHS Antonieta Pert, MD   5 mg at 04/29/20 2102  . folic acid (FOLVITE) tablet 1 mg  1 mg Oral Daily Antonieta Pert, MD   1 mg at 04/30/20 6568  . hydrochlorothiazide (MICROZIDE) capsule 12.5 mg  12.5 mg Oral Daily Dagar, Anjali, MD   12.5 mg at 04/27/20 1275  . hydrOXYzine (ATARAX/VISTARIL) tablet 25 mg  25 mg Oral TID PRN Antonieta Pert, MD   25 mg at 04/29/20 2102  . LORazepam (ATIVAN) tablet 1 mg  1 mg Oral Q6H PRN Antonieta Pert, MD      . magnesium hydroxide (MILK OF MAGNESIA) suspension 30 mL  30 mL Oral Daily PRN Antonieta Pert, MD      . sertraline (ZOLOFT) tablet 200 mg  200 mg Oral Daily Dagar, Geralynn Rile, MD   200 mg at 04/30/20 0824  . thiamine tablet 100 mg  100 mg Oral Daily Dagar, Geralynn Rile, MD   100 mg at 04/30/20 0825  . traZODone (DESYREL) tablet 100 mg  100 mg Oral QHS PRN Dagar, Geralynn Rile, MD   100 mg at 04/29/20  2102   PTA Medications: Medications Prior to Admission  Medication Sig Dispense Refill Last Dose  . acetaminophen (TYLENOL) 325 MG tablet Take 650 mg by mouth every 6 (six) hours as needed for moderate pain.      Marland Kitchen albuterol (PROVENTIL HFA;VENTOLIN HFA) 108 (90 Base) MCG/ACT inhaler Inhale 2 puffs into the lungs every 4 (four) hours as needed for wheezing or shortness of breath. (Patient not taking: Reported on 04/23/2020) 1 Inhaler 0   . azithromycin (ZITHROMAX) 250 MG tablet Take 1 tablet (250 mg total) by mouth daily. Take first 2 tablets together, then 1 every day until finished. (Patient not taking: Reported on 04/23/2020) 6 tablet 0   . folic acid (FOLVITE) 1 MG tablet Take 1 tablet (1 mg total) by mouth daily. (Patient not taking: Reported on 04/23/2020) 30 tablet 0   . Multiple Vitamin (MULTIVITAMIN WITH MINERALS) TABS tablet Take 1 tablet by mouth daily. (Patient not taking: Reported on 04/23/2020) 30 tablet 0   . predniSONE (DELTASONE) 20 MG tablet Take 2 tablets (40 mg total) by mouth daily. (Patient not taking: Reported on 04/23/2020) 10 tablet 0   . thiamine 100 MG tablet Take 1 tablet (100 mg total) by mouth daily. (Patient not taking: Reported on 04/23/2020) 30 tablet 0  Patient Stressors: Financial difficulties Health problems Marital or family conflict  Patient Strengths: Ability for Warden/ranger for treatment/growth  Treatment Modalities: Medication Management, Group therapy, Case management,  1 to 1 session with clinician, Psychoeducation, Recreational therapy.   Physician Treatment Plan for Primary Diagnosis: Major depressive disorder, recurrent episode, severe (HCC) Long Term Goal(s): Improvement in symptoms so as ready for discharge Improvement in symptoms so as ready for discharge   Short Term Goals: Ability to identify changes in lifestyle to reduce recurrence of condition will improve Ability to verbalize feelings will improve Ability  to disclose and discuss suicidal ideas Ability to demonstrate self-control will improve Ability to identify and develop effective coping behaviors will improve Ability to maintain clinical measurements within normal limits will improve Compliance with prescribed medications will improve Ability to identify triggers associated with substance abuse/mental health issues will improve Ability to identify changes in lifestyle to reduce recurrence of condition will improve Ability to verbalize feelings will improve Ability to disclose and discuss suicidal ideas Ability to demonstrate self-control will improve Ability to identify and develop effective coping behaviors will improve Ability to maintain clinical measurements within normal limits will improve Compliance with prescribed medications will improve Ability to identify triggers associated with substance abuse/mental health issues will improve  Medication Management: Evaluate patient's response, side effects, and tolerance of medication regimen.  Therapeutic Interventions: 1 to 1 sessions, Unit Group sessions and Medication administration.  Evaluation of Outcomes: Progressing  Physician Treatment Plan for Secondary Diagnosis: Principal Problem:   Major depressive disorder, recurrent episode, severe (HCC) Active Problems:   Alcohol dependence (HCC)   Hypokalemia   Hypomagnesemia   Lactic acid acidosis   Elevated LFTs   Wernicke's encephalopathy   Anemia of chronic disease   Suicidal ideation   Essential hypertension  Long Term Goal(s): Improvement in symptoms so as ready for discharge Improvement in symptoms so as ready for discharge   Short Term Goals: Ability to identify changes in lifestyle to reduce recurrence of condition will improve Ability to verbalize feelings will improve Ability to disclose and discuss suicidal ideas Ability to demonstrate self-control will improve Ability to identify and develop effective coping  behaviors will improve Ability to maintain clinical measurements within normal limits will improve Compliance with prescribed medications will improve Ability to identify triggers associated with substance abuse/mental health issues will improve Ability to identify changes in lifestyle to reduce recurrence of condition will improve Ability to verbalize feelings will improve Ability to disclose and discuss suicidal ideas Ability to demonstrate self-control will improve Ability to identify and develop effective coping behaviors will improve Ability to maintain clinical measurements within normal limits will improve Compliance with prescribed medications will improve Ability to identify triggers associated with substance abuse/mental health issues will improve     Medication Management: Evaluate patient's response, side effects, and tolerance of medication regimen.  Therapeutic Interventions: 1 to 1 sessions, Unit Group sessions and Medication administration.  Evaluation of Outcomes: Progressing   RN Treatment Plan for Primary Diagnosis: Major depressive disorder, recurrent episode, severe (HCC) Long Term Goal(s): Knowledge of disease and therapeutic regimen to maintain health will improve  Short Term Goals: Ability to remain free from injury will improve, Ability to verbalize frustration and anger appropriately will improve, Ability to identify and develop effective coping behaviors will improve and Compliance with prescribed medications will improve  Medication Management: RN will administer medications as ordered by provider, will assess and evaluate patient's response and provide education to patient for prescribed  medication. RN will report any adverse and/or side effects to prescribing provider.  Therapeutic Interventions: 1 on 1 counseling sessions, Psychoeducation, Medication administration, Evaluate responses to treatment, Monitor vital signs and CBGs as ordered, Perform/monitor  CIWA, COWS, AIMS and Fall Risk screenings as ordered, Perform wound care treatments as ordered.  Evaluation of Outcomes: Progressing   LCSW Treatment Plan for Primary Diagnosis: Major depressive disorder, recurrent episode, severe (HCC) Long Term Goal(s): Safe transition to appropriate next level of care at discharge, Engage patient in therapeutic group addressing interpersonal concerns.  Short Term Goals: Engage patient in aftercare planning with referrals and resources, Increase social support, Increase ability to appropriately verbalize feelings, Identify triggers associated with mental health/substance abuse issues and Increase skills for wellness and recovery  Therapeutic Interventions: Assess for all discharge needs, 1 to 1 time with Social worker, Explore available resources and support systems, Assess for adequacy in community support network, Educate family and significant other(s) on suicide prevention, Complete Psychosocial Assessment, Interpersonal group therapy.  Evaluation of Outcomes: Progressing   Progress in Treatment: Attending groups: Yes. Participating in groups: Yes. Taking medication as prescribed: Yes. Toleration medication: Yes. Family/Significant other contact made: No, will contact:  refused consents. Patient understands diagnosis: Yes. Discussing patient identified problems/goals with staff: Yes. Medical problems stabilized or resolved: Yes. Denies suicidal/homicidal ideation: Yes. Issues/concerns per patient self-inventory: No.   New problem(s) identified: No, Describe:  none.  New Short Term/Long Term Goal(s): medication stabilization, elimination of SI thoughts, development of comprehensive mental wellness plan.   Patient Goals:  "I don't know"  Discharge Plan or Barriers: Patient recently admitted. CSW will continue to follow and assess for appropriate referrals and possible discharge planning.   Reason for Continuation of Hospitalization:  Depression Medication stabilization Suicidal ideation  Estimated Length of Stay: 3-5 days  Attendees: Patient:  04/25/2020   Physician:  04/25/2020   Nursing:  04/25/2020   RN Care Manager: 04/25/2020  Social Worker: Ruthann Cancer, LCSW 04/25/2020   Recreational Therapist:  04/25/2020    04/25/2020    04/25/2020  Other: 04/25/2020       Scribe for Treatment Team: Otelia Santee, LCSW 04/30/2020 10:36 AM

## 2020-04-30 NOTE — Progress Notes (Signed)
Pt still presents with a depressed mood and a flat affect. Pt continues to be passively suicidal without a plan and verbally contracts for safety. Pt said that she had a plan earlier, but forgot what it was. One of this pt's main stressor and trigger is her family. Pt becomes angry or irritable when you mention her family. Pt reports isolating herself for the majority of day shift. Pt said that she just felt like being left alone and didn't want to interact with anyone. Tonight pt has been seen in the dayroom and did attend group. Pt reports a decreased appetite. Pt encouraged to be active in the milieu and interact with other pts to improve her mood. Pt denies HI and AVH. Active listening, reassurance, and support provided. Medications administered as ordered by MD. Q 15 min safety checks continue. Pt's safety has been maintained.    04/30/20 2000  Psych Admission Type (Psych Patients Only)  Admission Status Voluntary  Psychosocial Assessment  Patient Complaints Anxiety;Depression;Isolation;Sadness  Eye Contact Fair  Facial Expression Flat  Affect Depressed;Anxious;Sad  Speech Logical/coherent  Interaction Forwards little;Minimal  Motor Activity Slow;Unsteady  Appearance/Hygiene Unremarkable  Behavior Characteristics Cooperative;Calm;Anxious  Mood Depressed;Anxious;Sad;Pleasant  Thought Process  Coherency WDL  Content WDL  Delusions None reported or observed  Perception WDL  Hallucination None reported or observed  Judgment Poor  Confusion None  Danger to Self  Current suicidal ideation? Passive  Self-Injurious Behavior No self-injurious ideation or behavior indicators observed or expressed   Agreement Not to Harm Self Yes  Description of Agreement verbally contracts for safety  Danger to Others  Danger to Others None reported or observed

## 2020-04-30 NOTE — Progress Notes (Signed)
The patient's positive event for the day is that she worked on a crossword puzzle with one of her peers. She described her day as having been "smooth". Her goal for tomorrow is to work on her mood in that she wants to feel less depressed.

## 2020-04-30 NOTE — Progress Notes (Signed)
   04/30/20 1143  Vital Signs  Temp 98.5 F (36.9 C)  Temp Source Oral  Pulse Rate 78  BP 115/68  BP Location Left Arm  BP Method Automatic  Patient Position (if appropriate) Sitting   D: Patient admits to passive SI without a plan, denies HI. Pt. Denies AVH. Patient out in day room watching tv.  A:  Patient took scheduled medicine, except HCTZ d/t low BP of 94/69. Later, BP increased to 115/68.  Support and encouragement provided Routine safety checks conducted every 15 minutes. Patient  Informed to notify staff with any concerns.  Safety maintained.

## 2020-05-01 MED ORDER — BUPROPION HCL ER (SR) 100 MG PO TB12: 100.0000 mg | ORAL_TABLET | ORAL | Status: DC

## 2020-05-01 MED ORDER — BUPROPION HCL ER (SR) 100 MG PO TB12: 100.0000 mg | ORAL_TABLET | Freq: Every day | ORAL | Status: DC

## 2020-05-01 MED ORDER — SERTRALINE HCL 50 MG PO TABS
150.0000 mg | ORAL_TABLET | Freq: Every day | ORAL | Status: DC
  Administered 2020-05-02: 150 mg via ORAL
  Filled 2020-05-01 (×2): qty 3

## 2020-05-01 MED ORDER — BUPROPION HCL ER (SR) 100 MG PO TB12
100.0000 mg | ORAL_TABLET | ORAL | Status: DC
  Administered 2020-05-01: 100 mg via ORAL
  Filled 2020-05-01 (×2): qty 1
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
## 2020-05-01 NOTE — Progress Notes (Signed)
The patient verbalized that she watched TV today and was bored. Her goal for tomorrow is to inquire about her discharge plans.

## 2020-05-01 NOTE — Progress Notes (Addendum)
D:  Patient's self inventory sheet, patient sleeps good, sleep medication helpful.  Poor appetite, normal energy level, good concentration.  Rated depression and hopeless 8, anxiety 6.  Denied withdrawals.  SI, contracts for safety  Physical problems, yes, but did not check any block.  Denied physical pain.  Goal is my mood.  Plans, try to change it around.  Does have discharge plans. A:  Medications administered per MD orders.  Emotional support and encouragement given patient. R:  SI, off/on, no plans, contracts for safety.  Denied HI, contracts for safety.  Denied A/V hallucinations.  Safety maintained with 15 minute checks. Patient stattd she does have SI thoughts.  When SI, feels like she could find the closest thing I can." Has been talking to her mom.

## 2020-05-01 NOTE — [facility] Counselor (Signed)
CSW provided contact info and instructions to call [employer] and [facility] coordinated entry line. CSW also spoke with pt of the option of [employer].

## 2020-05-01 NOTE — [facility] Group Notes (Signed)
Occupational Therapy Group Note Date: 05/01/2020 Group Topic/Focus: Stress Management  Group Description: Group encouraged increased participation and engagement through discussion focused on topic of stress management. Patients engaged interactively to discuss components of stress including physical signs, emotional signs, negative management strategies, and positive management strategies. Each individual identified one new stress management strategy they would like to try moving forward.   Participation Level: Active   Participation Quality: Independent   Behavior: Calm and Cooperative   Speech/Thought Process: Focused   Affect/Mood: Euthymic   Insight: Fair   Judgement: Fair   Individualization: Virginia Hess was largely an observer, noted to sit quietly in the corner and observe. Pt did not verbally engage in discussion, however appeared attentive, engaged, and receptive to education and information received from this therapist and peers.   Modes of Intervention: Discussion and Education  Patient Response to Interventions:  Attentive and Engaged   Plan: Continue to engage patient in OT groups 2 - 3x/week.  05/01/2020  Donne Hazel, MOT, OTR/L

## 2020-05-02 MED ORDER — SERTRALINE HCL 100 MG PO TABS
100.0000 mg | ORAL_TABLET | Freq: Every day | ORAL | Status: DC
  Administered 2020-05-03: 100 mg via ORAL
  Filled 2020-05-02 (×3): qty 1

## 2020-05-02 NOTE — Progress Notes (Signed)
[facility] MD Progress Note  05/02/2020 11:04 AM Virginia Hess  MRN:  [id number] Subjective:  Pt is seen and examined today. Pt is still suicidal and states she is thinking of jumping out a bridge. Pt states her mood is terrible as she didn't get a good night sleep. Pt c/o Increased urination. Pt states she was waking up multiple times to use the bathroom.  Pt rates her mood is 0/10 (10 is the best mood) and Anxiety at 7/10. Pt slept 5.75 hours lats night. She thinks that it is because of new medication. Pt reports poor appetite and states she can't eat much. Pt states she attended the groups but going to bathroom frequently. Pt is not willing to share more information about her family issues. Pt denies any headache, nausea, vomiting, chest pain, SOB, abdominal pain, diarrhea, and constipation. Pt denies any medication side effects and has been tolerating it well. Pt was given opportunity to ask question. Pt denies any concerns. On examination, Pt isdepressed, cooperative and oriented x3. Pt speech is slow with decreased volume. Pt's Mood is depressed and affect isstillconstricted. Pt is notresponding to any internal stimuli. Endorses SIwith a plan (Jumping out of a bridge), Denies HI , denies AVH.  Nursing notes indicate that Pt didn't attend groups and she complainiedof being unhappy about the lack of programming and feeling bored in the unit. Pt was also enquiring about discharge. CSW talked to the Pt about Shelter options and option for [employer]. Objective :Patient is a 54 y.o. female withno past psychiatric historywhoinitially presentedvia EMS to [facility] after calling in themorningaftershe attempted suicide.Patient broke picture frame and caused injury to self bilateral wrist and abrasion to neck.Pt is admitted for stabilization of her symptoms.  Principal Problem: Major depressive disorder, recurrent episode, severe (HCC) Diagnosis: Principal Problem:   Major depressive  disorder, recurrent episode, severe (HCC) Active Problems:   Alcohol dependence (HCC)   Hypokalemia   Hypomagnesemia   Lactic acid acidosis   Elevated LFTs   Wernicke's encephalopathy   Anemia of chronic disease   Suicidal ideation   Essential hypertension  Total Time spent with patient: 20 minutes  Past Psychiatric History: see H&P  Past Medical History:  Past Medical History:  Diagnosis Date   Alcohol dependence (HCC)    Depression    Gravida 4 para 4     Past Surgical History:  Procedure Laterality Date   NO PAST SURGERIES     Family History:  Family History  Problem Relation Age of Onset   Hypertension Brother    Cancer Neg Hx    Heart disease Neg Hx    Stroke Neg Hx    Family Psychiatric  History: Se H&P Social History:  Social History   Substance and Sexual Activity  Alcohol Use Yes   Comment: 2 daily     Social History   Substance and Sexual Activity  Drug Use No    Social History   Socioeconomic History   Marital status: Single    Spouse name: Not on file   Number of children: Not on file   Years of education: Not on file   Highest education level: Not on file  Occupational History   Not on file  Tobacco Use   Smoking status: Current Every Day Smoker    Packs/day: 0.50    Years: 35.00    Pack years: 17.50    Types: Cigarettes   Smokeless tobacco: Never Used  Substance and Sexual Activity  Alcohol use: Yes    Comment: 2 daily   Drug use: No   Sexual activity: Not on file  Other Topics Concern   Not on file  Social History Narrative   Not on file   Social Determinants of Health   Financial Resource Strain:    Difficulty of Paying Living Expenses: Not on file  Food Insecurity:    Worried About Running Out of Food in the Last Year: Not on file   Ran Out of Food in the Last Year: Not on file  Transportation Needs:    Lack of Transportation (Medical): Not on file   Lack of Transportation (Non-Medical): Not  on file  Physical Activity:    Days of Exercise per Week: Not on file   Minutes of Exercise per Session: Not on file  Stress:    Feeling of Stress : Not on file  Social Connections:    Frequency of Communication with Friends and Family: Not on file   Frequency of Social Gatherings with Friends and Family: Not on file   Attends Religious Services: Not on file   Active Member of Clubs or Organizations: Not on file   Attends Banker Meetings: Not on file   Marital Status: Not on file   Additional Social History:                         Sleep: Fair  Appetite:  Poor  Current Medications: Current Facility-Administered Medications  Medication Dose Route Frequency Provider Last Rate Last Admin   acetaminophen (TYLENOL) tablet 650 mg  650 mg Oral Q6H PRN Antonieta Pert, MD   650 mg at 05/02/20 0852   albuterol (VENTOLIN HFA) 108 (90 Base) MCG/ACT inhaler 2 puff  2 puff Inhalation Q4H PRN Mariel Craft, MD       alum & mag hydroxide-simeth (MAALOX/MYLANTA) 200-200-20 MG/5ML suspension 30 mL  30 mL Oral Q4H PRN Antonieta Pert, MD       ARIPiprazole (ABILIFY) tablet 5 mg  5 mg Oral QHS Antonieta Pert, MD   5 mg at 05/01/20 2135   buPROPion [facility] SR) 12 hr tablet 100 mg  100 mg Oral Once per day on Mon Thu Sat Cebert Dettmann, MD   100 mg at 05/01/20 1539   folic acid (FOLVITE) tablet 1 mg  1 mg Oral Daily Antonieta Pert, MD   1 mg at 05/02/20 5284   hydrochlorothiazide (MICROZIDE) capsule 12.5 mg  12.5 mg Oral Daily Dagar, Geralynn Rile, MD   12.5 mg at 05/02/20 0849   hydrOXYzine (ATARAX/VISTARIL) tablet 25 mg  25 mg Oral TID PRN Antonieta Pert, MD   25 mg at 05/01/20 2135   LORazepam (ATIVAN) tablet 1 mg  1 mg Oral Q6H PRN Antonieta Pert, MD       magnesium hydroxide (MILK OF MAGNESIA) suspension 30 mL  30 mL Oral Daily PRN Antonieta Pert, MD       sertraline (ZOLOFT) tablet 150 mg  150 mg Oral Daily Karsten Ro, MD    150 mg at 05/02/20 1324   thiamine tablet 100 mg  100 mg Oral Daily Dagar, Geralynn Rile, MD   100 mg at 05/02/20 0850   traZODone (DESYREL) tablet 100 mg  100 mg Oral QHS PRN Dagar, Geralynn Rile, MD   100 mg at 05/01/20 2135    Lab Results: No results found for this or any previous visit (from the past 48 hour(s)).  Blood Alcohol level:  Lab Results  Component Value Date   ETH <10 04/22/2020   ETH 176 (H) 04/14/2020    Metabolic Disorder Labs: No results found for: HGBA1C, MPG No results found for: PROLACTIN No results found for: CHOL, TRIG, HDL, CHOLHDL, VLDL, LDLCALC  Physical Findings: AIMS: Facial and Oral Movements Muscles of Facial Expression: None, normal Lips and Perioral Area: None, normal Jaw: None, normal Tongue: None, normal,Extremity Movements Upper (arms, wrists, hands, fingers): None, normal Lower (legs, knees, ankles, toes): None, normal, Trunk Movements Neck, shoulders, hips: None, normal, Overall Severity Severity of abnormal movements (highest score from questions above): None, normal Incapacitation due to abnormal movements: None, normal Patient's awareness of abnormal movements (rate only patient's report): No Awareness, Dental Status Current problems with teeth and/or dentures?: No Does patient usually wear dentures?: No  CIWA:  CIWA-Ar Total: 2 COWS:     Musculoskeletal: Strength & Muscle Tone: within normal limits Gait & Station: unsteady Pt uses walker Patient leans: N/A  Psychiatric Specialty Exam: Physical Exam Vitals and nursing note reviewed.  Constitutional:      General: She is not in acute distress.    Appearance: Normal appearance. She is not ill-appearing, toxic-appearing or diaphoretic.  HENT:     Head: Normocephalic and atraumatic.  Pulmonary:     Effort: Pulmonary effort is normal.  Neurological:     Mental Status: She is alert.     Review of Systems  Constitutional: Positive for appetite change and fatigue. Negative for chills and  fever.  Respiratory: Negative for apnea, chest tightness and shortness of breath.   Cardiovascular: Negative for chest pain and palpitations.  Genitourinary: Positive for frequency.  Neurological: Negative for dizziness and headaches.  Psychiatric/Behavioral: Positive for dysphoric mood, sleep disturbance and suicidal ideas. Negative for confusion and hallucinations. The patient is nervous/anxious.     Blood pressure 92/60, pulse (!) 102, temperature 98 F (36.7 C), temperature source Oral, resp. rate 18, height 5\' 4"  (1.626 m), weight 65.8 kg, SpO2 98 %.Body mass index is 24.9 kg/m.  General Appearance: Casual  Eye Contact:  Fair  Speech:  Normal Rate  Volume:  Decreased  Mood:  Depressed  Affect:  Constricted  Thought Process:  Linear  Orientation:  Full (Time, Place, and Person)  Thought Content:  Delusions and Hallucinations: None  Suicidal Thoughts:  Yes.  with intent/plan  Homicidal Thoughts:  No  Memory:  Immediate;   Good Recent;   Good Remote;   Good  Judgement:  Impaired  Insight:  Lacking  Psychomotor Activity:  Decreased  Concentration:  Concentration: Good and Attention Span: Good  Recall:  Good  Fund of Knowledge:  Good  Language:  Good  Akathisia:  Negative  Handed:  Right  AIMS (if indicated):     Assets:  Resilience  ADL's:  Intact  Cognition:  WNL  Sleep:  Number of Hours: 5.75     Treatment Plan Summary:Patient is a 54 y.o. female withno past psychiatric historywhoinitially presentedvia EMS [facility] after calling in themorningaftershe attempted suicide.Patient broke picture frame and caused injury to self bilateral wrist and abrasion to neck.Pt is admitted for stabilization of her symptoms. Today, Pt isdepressed, cooperative and oriented x3. Pt speech is slow with decreased volume. Pt's Mood is depressed and affect isstillconstricted. Pt is notresponding to any internal stimuli. Endorses SIwith a plan (Jumping out of a bridge), Denies HI ,  denies AVH.  Blood pressure 92/60, pulse (!) 102, temperature 98 F (36.7 C), temperature source  Oral, resp. rate 18, height 5\' 4"  (1.626 m), weight 65.8 kg, SpO2 98 %. No New Labs. Plan- Daily contact with patient to assess and evaluate symptoms and progress in treatment  - Order Urinalysis -Continue Wellbutrin SR 100 three days a week starting from Today. (Thursday, Saturday, Monday) -Change Zoloft to 100 mg on Saturday and then 50 mg on Sunday. - Continue Abilify 5 mg  - Continue Ativan 1 mg Q6h PRN for withdrawal symptoms. -Continue to hold Hydrochlorothiazide 12.5 mg because of low BP. - Continue Vistaril 25 mg PO TID PRN for anxiety. - Continue Trazodone100mg  PO QHS PRN for insomnia. - Encourgement to attend group therapies. - Thiamine 100 mg Daily -  Folic acid 1 mg Daily -  Albuterol inhaler 2 puffs Q 4H PRN -  Monitor SI, Vitals, and medication side effects Karsten RoVandana  Inna Tisdell, MD 05/02/2020, 11:04 AM

## 2020-05-02 NOTE — Progress Notes (Signed)
Patient did not attend Goal setting and Emotion Regulation group. 

## 2020-05-02 NOTE — Progress Notes (Signed)
Patient remains sad and depressed. She was compliant with medication regime. She complainied of being unhappy about the lack of programming. She is currently resting in bed quietly at this time. 

## 2020-05-02 NOTE — Progress Notes (Signed)
   05/02/20 2341  Psych Admission Type (Psych Patients Only)  Admission Status Voluntary  Psychosocial Assessment  Patient Complaints None  Eye Contact Fair  Facial Expression  (smiling)  Affect Appropriate to circumstance  Speech Logical/coherent  Interaction Minimal  Motor Activity  (walking with walker)  Appearance/Hygiene In hospital gown  Behavior Characteristics Cooperative;Appropriate to situation  Mood Depressed  Thought Process  Coherency WDL  Content WDL  Delusions WDL  Perception WDL  Hallucination None reported or observed  Judgment WDL  Confusion WDL  Danger to Self  Current suicidal ideation? Passive  Agreement Not to Harm Self Yes  Description of Agreement contracts at hospital  Danger to Others  Danger to Others None reported or observed  Still has some passive SI tonight but talking and laughing with peers in dayroom. Mood upbeat. Took medications and walking with walker.

## 2020-05-02 NOTE — Progress Notes (Signed)
Recreation Therapy Notes  Date:  8.27.21 Time: 0930 Location: [address] Dayroom  Group Topic: Stress Management  Goal Area(s) Addresses:  Patient will identify positive stress management techniques. Patient will identify benefits of using stress management post d/c.  Intervention: Stress Management  Activity : Meditation.  LRT played Hess meditation that focused on letting go of the past and not letting it affect the present.  Patients were to listen and follow along as meditation played to engage in activity.    Education:  Stress Management, Discharge Planning.   Education Outcome: Acknowledges Education  Clinical Observations/Feedback: Pt did not attend group activity.    Tatsuo Musial, LRT/CTRS         Virginia Hess 05/02/2020 10:14 AM 

## 2020-05-02 NOTE — Progress Notes (Signed)
   05/02/20 0928  Psych Admission Type (Psych Patients Only)  Admission Status Voluntary  Psychosocial Assessment  Patient Complaints Depression  Eye Contact Fair  Facial Expression Flat  Affect Depressed;Anxious;Sad  Speech Logical/coherent  Interaction Forwards little;Minimal  Motor Activity Slow;Unsteady  Appearance/Hygiene Unremarkable  Behavior Characteristics Cooperative  Mood Depressed  Thought Process  Coherency WDL  Content WDL  Delusions None reported or observed  Perception WDL  Hallucination None reported or observed  Judgment Poor  Confusion None  Danger to Self  Current suicidal ideation? Passive (Plan to jump off a bridge.)  Self-Injurious Behavior No self-injurious ideation or behavior indicators observed or expressed   Agreement Not to Harm Self Yes  Description of Agreement verbally contracts for safety  Danger to Others  Danger to Others None reported or observed   Pt continues to endorse passive SI with plan of "finding the closest bridge and jump when I get out of here". She verbally contracts for safety.  Assigned psychiatrist made aware. Pt remains medication compliant. Reports poor sleep last night "because I was up all night urinating from that new medicine they gave me" she was started on hydrochlorothiazide Thursday morning. Continues to use walker without fall incident this shift. Q 15 minutes checks maintained without incident. Emotional support and encouragement offered as needed this shift. All medications administered as ordered.  Pt tolerated all PO intake well. Safety maintained on and off unit.

## 2020-05-03 LAB — URINALYSIS, ROUTINE W REFLEX MICROSCOPIC
Bilirubin Urine: NEGATIVE
Glucose, UA: NEGATIVE mg/dL
Hgb urine dipstick: NEGATIVE
Ketones, ur: NEGATIVE mg/dL
Nitrite: NEGATIVE
Protein, ur: NEGATIVE mg/dL
Specific Gravity, Urine: 1.015 (ref 1.005–1.030)
WBC, UA: >50 WBC/hpf — ABNORMAL HIGH (ref 0–5)
pH: 5 (ref 5.0–8.0)

## 2020-05-03 MED ORDER — SERTRALINE HCL 50 MG PO TABS
50.0000 mg | ORAL_TABLET | Freq: Every day | ORAL | Status: DC
  Administered 2020-05-04: 50 mg via ORAL
  Filled 2020-05-03 (×3): qty 1

## 2020-05-03 MED ORDER — DULOXETINE HCL 20 MG PO CPEP
20.0000 mg | ORAL_CAPSULE | Freq: Every day | ORAL | Status: DC
  Administered 2020-05-04 – 2020-05-06 (×3): 20 mg via ORAL
  Filled 2020-05-03 (×2): qty 1
  Filled 2020-05-03 (×2): qty 7
  Filled 2020-05-03 (×2): qty 1

## 2020-05-03 MED ORDER — SULFAMETHOXAZOLE-TRIMETHOPRIM 800-160 MG PO TABS
1.0000 | ORAL_TABLET | Freq: Two times a day (BID) | ORAL | Status: DC
  Administered 2020-05-03 – 2020-05-06 (×6): 1 via ORAL
  Filled 2020-05-03: qty 1
  Filled 2020-05-03: qty 10
  Filled 2020-05-03 (×2): qty 1
  Filled 2020-05-03 (×2): qty 10
  Filled 2020-05-03 (×4): qty 1
  Filled 2020-05-03: qty 10
  Filled 2020-05-03: qty 1

## 2020-05-03 NOTE — Progress Notes (Signed)
   05/03/20 2307  COVID-19 Daily Checkoff  Have you had a fever (temp > 37.80C/100F)  in the past 24 hours?  No  If you have had runny nose, nasal congestion, sneezing in the past 24 hours, has it worsened? No  COVID-19 EXPOSURE  Have you traveled outside the state in the past 14 days? No  Have you been in contact with someone with a confirmed diagnosis of COVID-19 or PUI in the past 14 days without wearing appropriate PPE? No  Have you been living in the same home as a person with confirmed diagnosis of COVID-19 or a PUI (household contact)? No  Have you been diagnosed with COVID-19? No

## 2020-05-03 NOTE — Progress Notes (Signed)
[facility] MD Progress Note  05/03/2020 1:59 PM Virginia Hess  MRN:  [id number] Subjective:  Pt is seen and examined today. Pt states her mood is  Better than yesterday and rates it 3/10 . Pt slept well last night. Nursing notes indicate that Pt slept for 6.5 hours. Pt states her  appetite is alright but she is not eating a lot. Pt states her urination is better now. Pt states she called 2 shelter places and they told her that they will call back but she hasn't heard back from them. Pt states she had been living in the house which was originally owned by her sister who lives in [place]. She was living with her daughter and her boyfriend and they were involved in selling [employer]. She states when her sister found out then she put the house for sale and she had to vacate the house. She had been living in [place] for 4 days before coming here. Pt states she also applied for disability 1 year ago but haven't gotten any money yet. Her plan is to go to a shelter after discharge. Currently, Pt denies any homicidal ideation and, visual and auditory hallucination. Pt denies any headache, nausea, vomiting, dizziness, chest pain, SOB, abdominal pain, diarrhea, and constipation. Pt denies any medication side effects and has been tolerating it well. Pt denies any concerns.  Therapist notes indicate that Pt attended group today and was engaged.  On examination, Pt isdepressed,cooperative and oriented x3. Pt speech is slow with decreased volume. Pt's Mood is dysphoric and affect isstillcongruent.Pt is notresponding to any internal stimuli. Endorses SIwith a plan (Jumping out of a nearest bridge), Denies HI , denies AVH.  Objective :Patient is a 54 y.o. female withno past psychiatric historywhoinitially [facility] after calling in themorningaftershe attempted suicide.Patient broke picture frame and caused injury to self bilateral wrist and abrasion to neck.Pt is admitted for stabilization of her  symptoms. Principal Problem: Major depressive disorder, recurrent episode, severe (HCC) Diagnosis: Principal Problem:   Major depressive disorder, recurrent episode, severe (HCC) Active Problems:   Alcohol dependence (HCC)   Hypokalemia   Hypomagnesemia   Lactic acid acidosis   Elevated LFTs   Wernicke's encephalopathy   Anemia of chronic disease   Suicidal ideation   Essential hypertension  Total Time spent with patient: 20 minutes  Past Psychiatric History: see H&P  Past Medical History:  Past Medical History:  Diagnosis Date  . Alcohol dependence (HCC)   . Depression   . Gravida 4 para 4     Past Surgical History:  Procedure Laterality Date  . NO PAST SURGERIES     Family History:  Family History  Problem Relation Age of Onset  . Hypertension Brother   . Cancer Neg Hx   . Heart disease Neg Hx   . Stroke Neg Hx    Family Psychiatric  History: see H&P Social History:  Social History   Substance and Sexual Activity  Alcohol Use Yes   Comment: 2 daily     Social History   Substance and Sexual Activity  Drug Use No    Social History   Socioeconomic History  . Marital status: Single    Spouse name: Not on file  . Number of children: Not on file  . Years of education: Not on file  . Highest education level: Not on file  Occupational History  . Not on file  Tobacco Use  . Smoking status: Current Every Day Smoker  Packs/day: 0.50    Years: 35.00    Pack years: 17.50    Types: Cigarettes  . Smokeless tobacco: Never Used  Substance and Sexual Activity  . Alcohol use: Yes    Comment: 2 daily  . Drug use: No  . Sexual activity: Not on file  Other Topics Concern  . Not on file  Social History Narrative  . Not on file   Social Determinants of Health   Financial Resource Strain:   . Difficulty of Paying Living Expenses: Not on file  Food Insecurity:   . Worried About Programme researcher, broadcasting/film/video in the Last Year: Not on file  . Ran Out of Food in the  Last Year: Not on file  Transportation Needs:   . Lack of Transportation (Medical): Not on file  . Lack of Transportation (Non-Medical): Not on file  Physical Activity:   . Days of Exercise per Week: Not on file  . Minutes of Exercise per Session: Not on file  Stress:   . Feeling of Stress : Not on file  Social Connections:   . Frequency of Communication with Friends and Family: Not on file  . Frequency of Social Gatherings with Friends and Family: Not on file  . Attends Religious Services: Not on file  . Active Member of Clubs or Organizations: Not on file  . Attends Banker Meetings: Not on file  . Marital Status: Not on file   Additional Social History:                         Sleep: Good  Appetite:  Fair  Current Medications: Current Facility-Administered Medications  Medication Dose Route Frequency Provider Last Rate Last Admin  . acetaminophen (TYLENOL) tablet 650 mg  650 mg Oral Q6H PRN Antonieta Pert, MD   650 mg at 05/02/20 [phone]  . albuterol (VENTOLIN HFA) 108 (90 Base) MCG/ACT inhaler 2 puff  2 puff Inhalation Q4H PRN Mariel Craft, MD      . alum & mag hydroxide-simeth (MAALOX/MYLANTA) 200-200-20 MG/5ML suspension 30 mL  30 mL Oral Q4H PRN Antonieta Pert, MD      . ARIPiprazole (ABILIFY) tablet 5 mg  5 mg Oral QHS Antonieta Pert, MD   5 mg at 05/02/20 2117  . folic acid (FOLVITE) tablet 1 mg  1 mg Oral Daily Antonieta Pert, MD   1 mg at 05/03/20 0827  . hydrOXYzine (ATARAX/VISTARIL) tablet 25 mg  25 mg Oral TID PRN Antonieta Pert, MD   25 mg at 05/01/20 2135  . LORazepam (ATIVAN) tablet 1 mg  1 mg Oral Q6H PRN Antonieta Pert, MD      . magnesium hydroxide (MILK OF MAGNESIA) suspension 30 mL  30 mL Oral Daily PRN Antonieta Pert, MD      . sertraline (ZOLOFT) tablet 100 mg  100 mg Oral Daily Karsten Ro, MD   100 mg at 05/03/20 0827  . thiamine tablet 100 mg  100 mg Oral Daily Dagar, Anjali, MD   100 mg at 05/03/20  0827  . traZODone (DESYREL) tablet 100 mg  100 mg Oral QHS PRN Dagar, Geralynn Rile, MD   100 mg at 05/02/20 2117    Lab Results:  Results for orders placed or performed during the hospital encounter of 04/24/20 (from the past 48 hour(s))  Urinalysis, Routine w reflex microscopic Urine, Clean Catch     Status: Abnormal   Collection Time:  05/02/20 10:58 AM  Result Value Ref Range   Color, Urine YELLOW YELLOW   APPearance CLEAR CLEAR   Specific Gravity, Urine 1.015 1.005 - 1.030   pH 5.0 5.0 - 8.0   Glucose, UA NEGATIVE NEGATIVE mg/dL   Hgb urine dipstick NEGATIVE NEGATIVE   Bilirubin Urine NEGATIVE NEGATIVE   Ketones, ur NEGATIVE NEGATIVE mg/dL   Protein, ur NEGATIVE NEGATIVE mg/dL   Nitrite NEGATIVE NEGATIVE   Leukocytes,Ua LARGE (A) NEGATIVE   RBC / HPF 11-20 0 - 5 RBC/hpf   WBC, UA >50 (H) 0 - 5 WBC/hpf   Bacteria, UA RARE (A) NONE SEEN   Squamous Epithelial / LPF 0-5 0 - 5   Hyaline Casts, UA PRESENT     Comment: Performed at [facility], [address] [place], [place] [address]    Blood Alcohol level:  Lab Results  Component Value Date   ETH <10 04/22/2020   ETH 176 (H) 04/14/2020    Metabolic Disorder Labs: No results found for: HGBA1C, MPG No results found for: PROLACTIN No results found for: CHOL, TRIG, HDL, CHOLHDL, VLDL, LDLCALC  Physical Findings: AIMS: Facial and Oral Movements Muscles of Facial Expression: None, normal Lips and Perioral Area: None, normal Jaw: None, normal Tongue: None, normal,Extremity Movements Upper (arms, wrists, hands, fingers): None, normal Lower (legs, knees, ankles, toes): None, normal, Trunk Movements Neck, shoulders, hips: None, normal, Overall Severity Severity of abnormal movements (highest score from questions above): None, normal Incapacitation due to abnormal movements: None, normal Patient's awareness of abnormal movements (rate only patient's report): No Awareness, Dental Status Current problems with  teeth and/or dentures?: No Does patient usually wear dentures?: No  CIWA:  CIWA-Ar Total: 2 COWS:     Musculoskeletal: Strength & Muscle Tone: within normal limits Gait & Station: unsteady Uses Walker Patient leans: N/A  Psychiatric Specialty Exam: Physical Exam Vitals and nursing note reviewed.  Constitutional:      General: She is not in acute distress.    Appearance: Normal appearance. She is not ill-appearing, toxic-appearing or diaphoretic.  HENT:     Head: Normocephalic and atraumatic.  Pulmonary:     Effort: Pulmonary effort is normal.  Neurological:     Mental Status: She is alert and oriented to person, place, and time.     Review of Systems  Constitutional: Positive for appetite change. Negative for fever.  Respiratory: Negative for chest tightness and shortness of breath.   Cardiovascular: Negative for chest pain and palpitations.  Gastrointestinal: Negative for abdominal pain, constipation, diarrhea and vomiting.  Neurological: Negative for dizziness and headaches.  Psychiatric/Behavioral: Positive for dysphoric mood and suicidal ideas. Negative for hallucinations and sleep disturbance. The patient is nervous/anxious.     Blood pressure 96/74, pulse (!) 127, temperature 98.3 F (36.8 C), temperature source Oral, resp. rate 18, height 5\' 4"  (1.626 m), weight 65.8 kg, SpO2 96 %.Body mass index is 24.9 kg/m.  General Appearance: Casual  Eye Contact:  Fair  Speech:  Normal Rate  Volume:  Decreased  Mood:  Dysphoric  Affect:  Congruent  Thought Process:  Linear  Orientation:  Full (Time, Place, and Person)  Thought Content:  Delusions and Hallucinations: None  Suicidal Thoughts:  Yes.  with intent/plan  Homicidal Thoughts:  No  Memory:  Immediate;   Good Recent;   Good Remote;   Good  Judgement:  Impaired  Insight:  Lacking  Psychomotor Activity:  Decreased  Concentration:  Concentration: Good and Attention Span: Good  Recall:  Good  Fund of Knowledge:   Good  Language:  Good  Akathisia:  Negative  Handed:  Right  AIMS (if indicated):     Assets:  Resilience  ADL's:  Intact  Cognition:  WNL  Sleep:  Number of Hours: 6.5     Treatment Plan Summary: Patient is a 54 y.o. female withno past psychiatric historywhoinitially presentedvia EMS [facility] after calling in themorningaftershe attempted suicide.Patient broke picture frame and caused injury to self bilateral wrist and abrasion to neck.Pt is admitted for stabilization of her symptoms. Today, on examination, Pt isdysphoric,cooperative and oriented x3. Pt speech is slow with decreased volume. Pt's Mood is dysphoric and affect isstillcongruent.Pt is notresponding to any internal stimuli. Endorses SIwith a plan (Jumping out of a nearest bridge), Denies HI , denies AVH.  Blood pressure 96/74, pulse (!) 127, temperature 98.3 F (36.8 C), temperature source Oral, resp. rate 18, height 5\' 4"  (1.626 m), weight 65.8 kg, SpO2 96 %. Wellbutrin was stopped yesterday because of increased urination. Urinalysis shows high Leucocytes >50 , RBC- 11-20, presence of Hyaline casts and rare bacteria.  Plan- Daily contact with patient to assess and evaluate symptoms and progress in treatment  - Start Bactrim 800 /160 mg BID for 7 days. - Zoloft 100 mg for today, Change Zoloft to 50 mg on Sunday. - Start Cymbalta 20 mg from tomorrow. - We will add another antidepressant tomorrow.  - Continue Abilify 5 mg  -Continue Ativan 1 mg Q6h PRN for withdrawal symptoms. -Continue Vistaril 25 mg PO TID PRN for anxiety. -Continue Trazodone100mg  PO QHS PRN for insomnia. -Encourgement to attend group therapies. -Thiamine 100 mg Daily -Folic acid 1 mg Daily -Albuterol inhaler 2 puffs Q 4H PRN -Monitor SI, Vitals, and medication side effects  Monday, MD 05/03/2020, 1:59 PM

## 2020-05-03 NOTE — Progress Notes (Signed)
Pt presents with a flat affect and depressed mood. She rates depression 8/10, hopelessness 8/10, and anxiety 6/10 (10 being worst). She reports passive SI with no intent or plan. She verbally contracts for safety. She reports fair sleep at bedtime. She reports a fair appetite today.   Orders reviewed. Vital signs reviewed. Verbal support provided. Pt encouraged to attend groups. 15 minute checks performed for safety.   Pt compliant with attending groups. Pt compliant with taking meds. Pt denies having any medications side effects. No concerns verbalized by the pt.

## 2020-05-03 NOTE — Progress Notes (Signed)
[facility] Group Notes:  (Nursing/MHT/Case Management/Adjunct)  Date:  05/03/2020  Time:  2030 Type of Therapy:  wrap up group  Participation Level:  Active  Participation Quality:  Appropriate, Attentive, Sharing and Supportive  Affect:  Depressed  Cognitive:  Appropriate  Insight:  Improving  Engagement in Group:  Engaged  Modes of Intervention:  Clarification, Education and Support  Summary of Progress/Problems: Positive thinking and positive change were discussed.   Virginia Hess 05/03/2020, 9:14 PM

## 2020-05-03 NOTE — [facility] Group Notes (Signed)
Adult Psychoeducational Group Note  Date:  05/03/2020 Time:  12:09 PM  Group Topic/Focus:  Goals Group:   The focus of this group is to help patients establish daily goals to achieve during treatment and discuss how the patient can incorporate goal setting into their daily lives to aide in recovery.  Participation Level:  Active  Participation Quality:  Appropriate  Affect:  Flat  Cognitive:  Appropriate  Insight: Appropriate  Engagement in Group:  Engaged  Modes of Intervention:  Discussion, Education and Exploration  Additional Comments:  Pt rated her energy at a 5. States her goal as finishing her book today.  Dione Housekeeper 05/03/2020, 12:09 PM

## 2020-05-03 NOTE — [facility] Group Notes (Signed)
LCSW Group Therapy Note  05/03/2020   10:00-11:00am   Type of Therapy and Topic:  Group Therapy: Anger Cues and Responses  Participation Level:  Active   Description of Group:   In this group, patients learned how to recognize the physical, cognitive, emotional, and behavioral responses they have to anger-provoking situations.  They identified a recent time they became angry and how they reacted.  They analyzed how their reaction was possibly beneficial and how it was possibly unhelpful.  The group discussed a variety of healthier coping skills that could help with such a situation in the future.  Focus was placed on how helpful it is to recognize the underlying emotions to our anger, because working on those can lead to a more permanent solution as well as our ability to focus on the important rather than the urgent.  Therapeutic Goals: 1. Patients will remember their last incident of anger and how they felt emotionally and physically, what their thoughts were at the time, and how they behaved. 2. Patients will identify how their behavior at that time worked for them, as well as how it worked against them. 3. Patients will explore possible new behaviors to use in future anger situations. 4. Patients will learn that anger itself is normal and cannot be eliminated, and that healthier reactions can assist with resolving conflict rather than worsening situations.  Summary of Patient Progress:  Patient was present and engaged when called on directly. Patient appeared to be actively listening. Pt did share she was angry last on yesterday when she could not get any sleep.   Therapeutic Modalities:   Cognitive Behavioral Therapy  Shellia Cleverly

## 2020-05-03 NOTE — Progress Notes (Signed)
   05/03/20 2310  Psych Admission Type (Psych Patients Only)  Admission Status Voluntary  Psychosocial Assessment  Patient Complaints Depression  Eye Contact Fair  Facial Expression Flat  Affect Appropriate to circumstance  Speech Logical/coherent  Interaction Minimal  Motor Activity Slow  Appearance/Hygiene In hospital gown  Behavior Characteristics Appropriate to situation  Mood Depressed  Thought Process  Coherency WDL  Content WDL  Delusions None reported or observed  Perception WDL  Hallucination None reported or observed  Judgment WDL  Confusion None  Danger to Self  Current suicidal ideation? Denies  Self-Injurious Behavior No self-injurious ideation or behavior indicators observed or expressed   Agreement Not to Harm Self Yes  Description of Agreement pt verbally contracts for safety   Danger to Others  Danger to Others None reported or observed

## 2020-05-04 MED ORDER — TRAZODONE HCL 50 MG PO TABS
50.0000 mg | ORAL_TABLET | Freq: Every evening | ORAL | Status: DC | PRN
  Administered 2020-05-04 – 2020-05-05 (×2): 50 mg via ORAL
  Filled 2020-05-04: qty 7
  Filled 2020-05-04 (×2): qty 1

## 2020-05-04 NOTE — [facility] Group Notes (Signed)
[facility] LCSW Group Therapy Note  Date/Time:  05/04/20  10:00AM-11:00AM  Type of Therapy and Topic:  Group Therapy:  Music and Mood  Participation Level: Minimal  Description of Group: In this process group, members listened to a variety of genres of music and identified that different types of music evoke different responses.  Patients were encouraged to identify music that was soothing for them and music that was energizing for them.  Patients discussed how this knowledge can help with wellness and recovery in various ways including managing depression and anxiety as well as encouraging healthy sleep habits.    Therapeutic Goals: 1. Patients will explore the impact of different varieties of music on mood 2. Patients will verbalize the thoughts they have when listening to different types of music 3. Patients will identify music that is soothing to them as well as music that is energizing to them 4. Patients will discuss how to use this knowledge to assist in maintaining wellness and recovery 5. Patients will explore the use of music as a coping skill with an emphasis on it being a relaxation skill 6. Patient were given a handout with two additional coping strategies: challenging negative thinking and imagery 7. Patients were given a log and challenged to practice a relaxation skill 4-5 times a week  Summary of Patient Progress:  Patient was present throughout group. Patient shared feeling like a 4 on a scale of 0-10 with 10 being the best. Patient did not engage in discussion. Patient did shared feeling better after the music group.   Therapeutic Modalities: Solution Focused Brief Therapy Activity  Raeanne Gathers, LCSW

## 2020-05-04 NOTE — Progress Notes (Signed)
   05/04/20 2134  Psych Admission Type (Psych Patients Only)  Admission Status Voluntary  Psychosocial Assessment  Patient Complaints Depression  Eye Contact Fair  Facial Expression Flat  Affect Appropriate to circumstance  Speech Logical/coherent  Interaction Assertive  Motor Activity Unsteady;Slow (utilizes walker)  Appearance/Hygiene In hospital gown  Behavior Characteristics Appropriate to situation  Mood Depressed  Thought Process  Content WDL  Delusions None reported or observed  Perception WDL  Hallucination None reported or observed  Judgment WDL  Confusion None  Danger to Self  Current suicidal ideation? Passive  Self-Injurious Behavior No self-injurious ideation or behavior indicators observed or expressed   Agreement Not to Harm Self Yes  Description of Agreement pt verbally contracts for safety   Danger to Others  Danger to Others None reported or observed

## 2020-05-04 NOTE — Progress Notes (Signed)
   05/04/20 2131  COVID-19 Daily Checkoff  Have you had a fever (temp > 37.80C/100F)  in the past 24 hours?  No  If you have had runny nose, nasal congestion, sneezing in the past 24 hours, has it worsened? No  COVID-19 EXPOSURE  Have you traveled outside the state in the past 14 days? No  Have you been in contact with someone with a confirmed diagnosis of COVID-19 or PUI in the past 14 days without wearing appropriate PPE? No  Have you been living in the same home as a person with confirmed diagnosis of COVID-19 or a PUI (household contact)? No  Have you been diagnosed with COVID-19? No

## 2020-05-04 NOTE — Progress Notes (Addendum)
[facility] MD Progress Note  05/04/2020 9:55 AM Virginia Hess  MRN:  [id number] Subjective:  Pt is seen and examined today. Pt states her mood is much better than yesterday and rates it 5/10 . Pt endorses SI states she is thinking of a plan. Pt denies Homicidal ideation. Pt slept well last night. Nursing notes indicate that Pt slept for 6.5 hours. She says her appetite is better today. Pt states she hasn't heard from any shelter places yet. Pt was living in [place] before. She plans to go to shelter after discharge. Pt denies any increased urination or burning sensation. Pt was informed about Urinalysis results and she was put on bactrim yesterday. Pt denies any headache, nausea, vomiting, dizziness, chest pain, SOB, abdominal pain, diarrhea, and constipation. Pt denies any medication side effects and has been tolerating it well. Pt denies any concerns. On examination, Pt isdysphoric,cooperative and oriented x3. Pt speech isslowwith decreased volume. Pt's Mood is dysphoric and affect iscongruent.Pt is notresponding to any internal stimuli. Endorses SIwith no plan, Denies HI , denies AVH. Objective :Patient is a 54 y.o. female withno past psychiatric historywhoinitially presentedvia EMS to [facility] after calling in themorningaftershe attempted suicide.Patient broke picture frame and caused injury to self bilateral wrist and abrasion to neck.Pt is admitted for stabilization of her symptoms. Principal Problem: Major depressive disorder, recurrent episode, severe (HCC) Diagnosis: Principal Problem:   Major depressive disorder, recurrent episode, severe (HCC) Active Problems:   Alcohol dependence (HCC)   Hypokalemia   Hypomagnesemia   Lactic acid acidosis   Elevated LFTs   Wernicke's encephalopathy   Anemia of chronic disease   Suicidal ideation   Essential hypertension  Total Time spent with patient: 20 minutes  Past Psychiatric History: See H&P  Past Medical History:  Past Medical  History:  Diagnosis Date  . Alcohol dependence (HCC)   . Depression   . Gravida 4 para 4     Past Surgical History:  Procedure Laterality Date  . NO PAST SURGERIES     Family History:  Family History  Problem Relation Age of Onset  . Hypertension Brother   . Cancer Neg Hx   . Heart disease Neg Hx   . Stroke Neg Hx    Family Psychiatric  History: see H&P Social History:  Social History   Substance and Sexual Activity  Alcohol Use Yes   Comment: 2 daily     Social History   Substance and Sexual Activity  Drug Use No    Social History   Socioeconomic History  . Marital status: Single    Spouse name: Not on file  . Number of children: Not on file  . Years of education: Not on file  . Highest education level: Not on file  Occupational History  . Not on file  Tobacco Use  . Smoking status: Current Every Day Smoker    Packs/day: 0.50    Years: 35.00    Pack years: 17.50    Types: Cigarettes  . Smokeless tobacco: Never Used  Substance and Sexual Activity  . Alcohol use: Yes    Comment: 2 daily  . Drug use: No  . Sexual activity: Not on file  Other Topics Concern  . Not on file  Social History Narrative  . Not on file   Social Determinants of Health   Financial Resource Strain:   . Difficulty of Paying Living Expenses: Not on file  Food Insecurity:   . Worried About Programme researcher, broadcasting/film/video in the  Last Year: Not on file  . Ran Out of Food in the Last Year: Not on file  Transportation Needs:   . Lack of Transportation (Medical): Not on file  . Lack of Transportation (Non-Medical): Not on file  Physical Activity:   . Days of Exercise per Week: Not on file  . Minutes of Exercise per Session: Not on file  Stress:   . Feeling of Stress : Not on file  Social Connections:   . Frequency of Communication with Friends and Family: Not on file  . Frequency of Social Gatherings with Friends and Family: Not on file  . Attends Religious Services: Not on file  . Active  Member of Clubs or Organizations: Not on file  . Attends Banker Meetings: Not on file  . Marital Status: Not on file   Additional Social History:                         Sleep: Good  Appetite:  Fair  Current Medications: Current Facility-Administered Medications  Medication Dose Route Frequency Provider Last Rate Last Admin  . acetaminophen (TYLENOL) tablet 650 mg  650 mg Oral Q6H PRN Antonieta Pert, MD   650 mg at 05/02/20 [phone]  . albuterol (VENTOLIN HFA) 108 (90 Base) MCG/ACT inhaler 2 puff  2 puff Inhalation Q4H PRN Mariel Craft, MD      . alum & mag hydroxide-simeth (MAALOX/MYLANTA) 200-200-20 MG/5ML suspension 30 mL  30 mL Oral Q4H PRN Antonieta Pert, MD      . ARIPiprazole (ABILIFY) tablet 5 mg  5 mg Oral QHS Antonieta Pert, MD   5 mg at 05/03/20 2111  . DULoxetine (CYMBALTA) DR capsule 20 mg  20 mg Oral Daily Karsten Ro, MD   20 mg at 05/04/20 0752  . folic acid (FOLVITE) tablet 1 mg  1 mg Oral Daily Antonieta Pert, MD   1 mg at 05/04/20 6378  . hydrOXYzine (ATARAX/VISTARIL) tablet 25 mg  25 mg Oral TID PRN Antonieta Pert, MD   25 mg at 05/01/20 2135  . LORazepam (ATIVAN) tablet 1 mg  1 mg Oral Q6H PRN Antonieta Pert, MD      . magnesium hydroxide (MILK OF MAGNESIA) suspension 30 mL  30 mL Oral Daily PRN Antonieta Pert, MD      . sertraline (ZOLOFT) tablet 50 mg  50 mg Oral Daily Karsten Ro, MD   50 mg at 05/04/20 0752  . sulfamethoxazole-trimethoprim (BACTRIM DS) 800-160 MG per tablet 1 tablet  1 tablet Oral Q12H Karsten Ro, MD   1 tablet at 05/04/20 0753  . thiamine tablet 100 mg  100 mg Oral Daily Dagar, Anjali, MD   100 mg at 05/04/20 0752  . traZODone (DESYREL) tablet 100 mg  100 mg Oral QHS PRN Dagar, Geralynn Rile, MD   100 mg at 05/03/20 2111    Lab Results:  Results for orders placed or performed during the hospital encounter of 04/24/20 (from the past 48 hour(s))  Urinalysis, Routine w reflex microscopic Urine,  Clean Catch     Status: Abnormal   Collection Time: 05/02/20 10:58 AM  Result Value Ref Range   Color, Urine YELLOW YELLOW   APPearance CLEAR CLEAR   Specific Gravity, Urine 1.015 1.005 - 1.030   pH 5.0 5.0 - 8.0   Glucose, UA NEGATIVE NEGATIVE mg/dL   Hgb urine dipstick NEGATIVE NEGATIVE   Bilirubin Urine NEGATIVE NEGATIVE  Ketones, ur NEGATIVE NEGATIVE mg/dL   Protein, ur NEGATIVE NEGATIVE mg/dL   Nitrite NEGATIVE NEGATIVE   Leukocytes,Ua LARGE (A) NEGATIVE   RBC / HPF 11-20 0 - 5 RBC/hpf   WBC, UA >50 (H) 0 - 5 WBC/hpf   Bacteria, UA RARE (A) NONE SEEN   Squamous Epithelial / LPF 0-5 0 - 5   Hyaline Casts, UA PRESENT     Comment: Performed at [facility], [address], [place], [place] [address]    Blood Alcohol level:  Lab Results  Component Value Date   ETH <10 04/22/2020   ETH 176 (H) 04/14/2020    Metabolic Disorder Labs: No results found for: HGBA1C, MPG No results found for: PROLACTIN No results found for: CHOL, TRIG, HDL, CHOLHDL, VLDL, LDLCALC  Physical Findings: AIMS: Facial and Oral Movements Muscles of Facial Expression: None, normal Lips and Perioral Area: None, normal Jaw: None, normal Tongue: None, normal,Extremity Movements Upper (arms, wrists, hands, fingers): None, normal Lower (legs, knees, ankles, toes): None, normal, Trunk Movements Neck, shoulders, hips: None, normal, Overall Severity Severity of abnormal movements (highest score from questions above): None, normal Incapacitation due to abnormal movements: None, normal Patient's awareness of abnormal movements (rate only patient's report): No Awareness, Dental Status Current problems with teeth and/or dentures?: No Does patient usually wear dentures?: No  CIWA:  CIWA-Ar Total: 2 COWS:     Musculoskeletal: Strength & Muscle Tone: within normal limits Gait & Station: normal Patient leans: N/A  Psychiatric Specialty Exam: Physical Exam Vitals and nursing note  reviewed.  Constitutional:      General: She is not in acute distress.    Appearance: Normal appearance. She is not ill-appearing, toxic-appearing or diaphoretic.  HENT:     Head: Normocephalic and atraumatic.  Pulmonary:     Effort: Pulmonary effort is normal.  Neurological:     Mental Status: She is alert.     Review of Systems  Constitutional: Negative for activity change, appetite change, fatigue and fever.  Respiratory: Negative for chest tightness and shortness of breath.   Cardiovascular: Negative for chest pain and palpitations.  Gastrointestinal: Negative for abdominal pain, constipation, nausea and vomiting.  Genitourinary: Negative for difficulty urinating and dysuria.  Neurological: Negative for dizziness, light-headedness and headaches.  Psychiatric/Behavioral: Positive for dysphoric mood and suicidal ideas. Negative for hallucinations. The patient is not nervous/anxious.     Blood pressure (!) 75/64, pulse (!) 128, temperature 98.1 F (36.7 C), temperature source Oral, resp. rate 18, height 5\' 4"  (1.626 m), weight 65.8 kg, SpO2 99 %.Body mass index is 24.9 kg/m.  General Appearance: Casual  Eye Contact:  Fair  Speech:  Normal Rate  Volume:  Decreased  Mood:  Dysphoric  Affect:  Congruent  Thought Process:  Linear  Orientation:  Full (Time, Place, and Person)  Thought Content:  Delusions and Hallucinations: None  Suicidal Thoughts:  Yes.  without intent/plan  Homicidal Thoughts:  No  Memory:  Negative Immediate;   Good Recent;   Good Remote;   Good  Judgement:  Impaired  Insight:  Lacking  Psychomotor Activity:  Decreased  Concentration:  Concentration: Good and Attention Span: Good  Recall:  Good  Fund of Knowledge:  Good  Language:  Good  Akathisia:  Negative  Handed:  Right  AIMS (if indicated):     Assets:  Resilience  ADL's:  Intact  Cognition:  WNL  Sleep:  Number of Hours: 6.5     Treatment Plan Summary: Patient is  a 54 y.o. female withno  past psychiatric historywhoinitially [facility] after calling in themorningaftershe attempted suicide.Patient broke picture frame and caused injury to self bilateral wrist and abrasion to neck.Pt is admitted for stabilization of her symptoms. On examination, Pt isdysphoric,cooperative and oriented x3. Pt speech isslowwith decreased volume. Pt's Mood is dysphoric and affect iscongruent.Pt is notresponding to any internal stimuli. Endorses SIwith no plan, Denies HI , denies AVH. Blood pressure (!) 75/64, pulse (!) 128, temperature 98.1 F (36.7 C), temperature source Oral, resp. rate 18, height 5\' 4"  (1.626 m), weight 65.8 kg, SpO2 99 %. Pt was put on Bactrim yesterday. Pt denies any increased urination and burning.   Plan-  Daily contact with patient to assess and evaluate symptoms and progress in treatment  - Monitor BP - Encourage her to call more shelter places on Monday. - Stop Zoloft from tomorrow. Pt took 50 mg Zoloft today. - Continue Cymbalta 20 mg. - Continue Abilify 5 mg  -Continue Ativan 1 mg Q6h PRN for withdrawal symptoms. -Continue Vistaril 25 mg PO TID PRN for anxiety. -Continue Trazodone100mg  PO QHS PRN for insomnia. -Encourgement to attend group therapies. -Thiamine 100 mg Daily -Folic acid 1 mg Daily -Albuterol inhaler 2 puffs Q 4H PRN -Monitor SI, Vitals, and medication side effects - Discharge disposition in progress.   Karsten RoVandana  Srinika Delone, MD 05/04/2020, 9:55 AM

## 2020-05-04 NOTE — Progress Notes (Signed)
Pt rates depression 7/10, anxiety 5/10, and hopelessness 7/10. Pt endorses SI with a plan to "drop a blow-dryer in the bathtub." She verbally contracts for safety. She denies HI. She reports good sleep at night. She reports having a fair appetite. She refused to talk about her children and stated that she has family in the area but they are not supportive.   Orders reviewed. Vital signs reviewed. Verbal support provided . Pt encouraged to attend groups. 15 minute checks performed for safety.   Pt compliant with taking meds and denies any medication side effects.

## 2020-05-04 NOTE — Discharge Summary (Signed)
Physician Discharge Summary Note  Patient:  Virginia Hess is an 54 y.o., female MRN:  [id number] DOB:  [birth year] Patient phone:  [phone] (home)  Patient address:   [address] [place] [address],  Total Time spent with patient: 30 minutes  Date of Admission:  04/24/2020 Date of Discharge: 05/06/2020  Reason for Admission:  Patient is a 54 y.o. female with no past psychiatric history who initially presented via EMS to [facility] after calling in the morning after she attempted suicide. Patient broke picture frame and caused injury to self bilateral wrist and abrasion to neck.   Principal Problem: Major depressive disorder, recurrent episode, severe (HCC) Discharge Diagnoses: Principal Problem:   Major depressive disorder, recurrent episode, severe (HCC) Active Problems:   Alcohol dependence (HCC)   Hypokalemia   Hypomagnesemia   Lactic acid acidosis   Elevated LFTs   Wernicke's encephalopathy   Anemia of chronic disease   Suicidal ideation   Essential hypertension   Past Psychiatric History: None reported  Past Medical History:  Past Medical History:  Diagnosis Date  . Alcohol dependence (HCC)   . Depression   . Gravida 4 para 4     Past Surgical History:  Procedure Laterality Date  . NO PAST SURGERIES     Family History:  Family History  Problem Relation Age of Onset  . Hypertension Brother   . Cancer Neg Hx   . Heart disease Neg Hx   . Stroke Neg Hx    Family Psychiatric  History: None reported. Social History:  Social History   Substance and Sexual Activity  Alcohol Use Yes   Comment: 2 daily     Social History   Substance and Sexual Activity  Drug Use No    Social History   Socioeconomic History  . Marital status: Single    Spouse name: Not on file  . Number of children: Not on file  . Years of education: Not on file  . Highest education level: Not on file  Occupational History  . Not on file  Tobacco Use  . Smoking status:  Current Every Day Smoker    Packs/day: 0.50    Years: 35.00    Pack years: 17.50    Types: Cigarettes  . Smokeless tobacco: Never Used  Substance and Sexual Activity  . Alcohol use: Yes    Comment: 2 daily  . Drug use: No  . Sexual activity: Not on file  Other Topics Concern  . Not on file  Social History Narrative  . Not on file   Social Determinants of Health   Financial Resource Strain:   . Difficulty of Paying Living Expenses: Not on file  Food Insecurity:   . Worried About Programme researcher, broadcasting/film/video in the Last Year: Not on file  . Ran Out of Food in the Last Year: Not on file  Transportation Needs:   . Lack of Transportation (Medical): Not on file  . Lack of Transportation (Non-Medical): Not on file  Physical Activity:   . Days of Exercise per Week: Not on file  . Minutes of Exercise per Session: Not on file  Stress:   . Feeling of Stress : Not on file  Social Connections:   . Frequency of Communication with Friends and Family: Not on file  . Frequency of Social Gatherings with Friends and Family: Not on file  . Attends Religious Services: Not on file  . Active Member of Clubs or Organizations: Not on file  .  Attends Banker Meetings: Not on file  . Marital Status: Not on file    Hospital Course:  Admission Notes (H&P)-Patient is a 54 y.o. female with no past psychiatric history who initially presented via EMS to [facility] after calling in the morning after she attempted suicide. Patient broke picture frame and caused injury to self bilateral wrist and abrasion to neck. She admitted to increased stress in her personal life which led her to her suicidal attempt. At initial clinical assessment with counselor she stated she has been dealing with family issues, primarily her four grown daughters taking advantage of her. She shares that they started dropping off their kids with her after work on Fridays, with the expectation she would keep them all weekend. This has been  ongoing until she recently left her job due to medical reasons. She stated she is "tired of being used." She denied any drug use. Patient admitted to history of alcohol use, however she stated she had cut back. She was hospitalized in 2018 for wernicke's encephalopathy and she was advised to discontinue alcohol use. She has since cut back, however has not discontinued use. Pt is seen and examined today. Pt states she doesn't want to be seen as there is no use, she is done with her life and now she just wants to end it". She states "I am so tired of this life, I  just want to end it". When asked about recent stressors she just says " I am just tired". When asked about her family, Pt gets irritable and guarded and she stated she doesn't want to speak about it. Pt states "you can give me medication but I don't have to take it when I go home, Its my life". Pt states she has been depressed for a while. She endorses hopelessness, helplessness, worthlessness, fatigue, decreased sleep, decreased energy, anhedonia and poor appetite but denies any guilt. Pt endorses suicidal ideations states she feels like hurting herself everyday. She states sometimes she feels like walking up the bridge and jumping over. Pt states she has problem with staying asleep and wakes up, watch TV and go back to sleep again. Pt states she was feeling good and had lots of energy when she was raising her kids. Patient has no past history of psychiatric treatment. She denies past suicide attempts.  She has never been on any antidepressant or antianxiety medications. Denies any drug use. She denies HI and auditory and visual hallucinations. Pt denies paranoia. Pt doesn't want to give any collateral contact options.  Pt denies any medical illnesses. Pt is unemployed and lives alone.  On examination, Pt is irritable, depressed, cooperative and oriented x3. Pt speech is slow with decreased volume. Pt's Mood is depressed, hopeless, irritable and  worthless and affect is Flat. Her thought process is tangential, focused on SI. Pt is not espousing to any internal stimuli. Endorses SI with a plan (Sometimes thinks of jumping from a bridge) , Denies HI , denies AVH. Pt uses a walker. During In patient- Pt was put on 15 minutes suicide checks. Pt's symptoms were managed with Zoloft 100 Mg, Albuterol Inhaler 2 puffs Q 4 H PRN for Wheeze, SOB, Folic acid 1 mg, Ativan 1 mg Q 6H PRN for withdrawal symptoms if CIWA >10, Milk of Magnesia 30 ml PRN for constipation, Thiamin 100 mg Daily, Hydroxyzine 25 mg TID PRN for Anxiety, and Trazodone 50 mg QHS PRN for sleep. Zoloft was titrated to 200 mg due to Pt's continued suicidal  thoughts. ECT was considered. Due to the non availability of ECT technician at [facility] at the present time, we couldn't send pt for ECT. Pt was started on Abilify 5 mg for augmentation. Pt stated she had been living in the house which was originally owned by her sister who lives in [place]. She was living with her daughter and her boyfriend and they were involved in selling Dope. She stated when her sister found out then she put the house for sale and she had to vacate the house. She had been living in [place] for 4 days before coming here. Pt continued to have depressed mood and Suicidal thoughts. Wellbutrin 150 mg SR three days a week was added but Pt's developed increased urination so it was stopped. Urinalysis indicated UTI so Pt was started on Bactrim BID for 7 days started on 05/03/2020. Zoloft was tapered to 150 mg then to 100 mg and then 50 mg due to being ineffective. Cymbalta 20 mg was added for depression and SI. Pt's mood, affect, appetite and sleep improved during her time in the inpatient unit. During her stay Patient did not display any dangerous, violent or suicidal behavior on the unit.  Pt interacted with patients & staff appropriately, participated appropriately in the group sessions/therapies off and on. Pt's medications were addressed  & adjusted to meet her needs. At the time of discharge. Pt continued to have suicidal ideation without any plan. Pt states her mood and anxiety is better than before. Pt is homeless and will be discharged to a homeless shelter. Pt agrees with this plan. Pt currently denies any Homicidal ideations and visual and auditory hallucinations. Pt currently denies any new issues or concerns. Education and supportive counseling provided throughout her hospital stay & upon discharge. Pt was recommended for outpatient follow-up care & medication management upon discharge to assure her continuity of care. Physical Findings: AIMS: Facial and Oral Movements Muscles of Facial Expression: None, normal Lips and Perioral Area: None, normal Jaw: None, normal Tongue: None, normal,Extremity Movements Upper (arms, wrists, hands, fingers): None, normal Lower (legs, knees, ankles, toes): None, normal, Trunk Movements Neck, shoulders, hips: None, normal, Overall Severity Severity of abnormal movements (highest score from questions above): None, normal Incapacitation due to abnormal movements: None, normal Patient's awareness of abnormal movements (rate only patient's report): No Awareness, Dental Status Current problems with teeth and/or dentures?: No Does patient usually wear dentures?: No  CIWA:  CIWA-Ar Total: 2 COWS:     Musculoskeletal: Strength & Muscle Tone: within normal limits Gait & Station: unsteady Pt uses walker Patient leans: N/A  Psychiatric Specialty Exam: Physical Exam Vitals and nursing note reviewed.  Constitutional:      General: She is not in acute distress.    Appearance: Normal appearance. She is not ill-appearing, toxic-appearing or diaphoretic.  HENT:     Head: Normocephalic and atraumatic.  Pulmonary:     Effort: Pulmonary effort is normal.  Neurological:     Mental Status: She is alert and oriented to person, place, and time.     Review of Systems  Constitutional: Negative for  activity change, appetite change, fatigue and fever.  Eyes: Negative for visual disturbance.  Respiratory: Negative for chest tightness and shortness of breath.   Cardiovascular: Negative for chest pain and palpitations.  Gastrointestinal: Negative for abdominal pain, constipation, diarrhea and nausea.  Genitourinary: Negative for dysuria and frequency.  Neurological: Negative for dizziness, light-headedness and headaches.  Psychiatric/Behavioral: Positive for suicidal ideas. Negative for hallucinations and sleep disturbance.  Blood pressure 94/64, pulse (!) 106, temperature 98.3 F (36.8 C), temperature source Oral, resp. rate 18, height 5\' 4"  (1.626 m), weight 65.8 kg, SpO2 97 %.Body mass index is 24.9 kg/m.  General Appearance: Casual  Eye Contact:  Good  Speech:  Normal Rate  Volume:  Normal  Mood:  Euthymic  Affect:  Congruent  Thought Process:  Coherent and Descriptions of Associations: Circumstantial  Orientation:  Full (Time, Place, and Person)  Thought Content:  Rumination  Suicidal Thoughts:  Yes.  without intent/plan  Homicidal Thoughts:  No  Memory:  Immediate;   Fair Recent;   Fair Remote;   Fair  Judgement:  Intact  Insight:  Lacking  Psychomotor Activity:  Normal  Concentration:  Concentration: Good and Attention Span: Good  Recall:  FiservFair  Fund of Knowledge:  Fair  Language:  Good  Akathisia:  Negative  Handed:  Right  AIMS (if indicated):     Assets:  Desire for Improvement Resilience  ADL's:  Intact  Cognition:  WNL  Sleep:  Number of Hours: 6.75        Has this patient used any form of tobacco in the last 30 days? (Cigarettes, Smokeless Tobacco, Cigars, and/or Pipes) Yes, Yes, A prescription for an FDA-approved tobacco cessation medication was offered at discharge and the patient refused  Blood Alcohol level:  Lab Results  Component Value Date   ETH <10 04/22/2020   ETH 176 (H) 04/14/2020    Metabolic Disorder Labs:  No results found for:  HGBA1C, MPG No results found for: PROLACTIN No results found for: CHOL, TRIG, HDL, CHOLHDL, VLDL, LDLCALC  See Psychiatric Specialty Exam and Suicide Risk Assessment completed by Attending Physician prior to discharge.  Discharge destination:  Other:  Homeless Shelter  Is patient on multiple antipsychotic therapies at discharge:  No   Has Patient had three or more failed trials of antipsychotic monotherapy by history:  No  Recommended Plan for Multiple Antipsychotic Therapies: NA   Allergies as of 05/05/2020   No Known Allergies     Medication List    STOP taking these medications   acetaminophen 325 MG tablet Commonly known as: TYLENOL   azithromycin 250 MG tablet Commonly known as: ZITHROMAX   multivitamin with minerals Tabs tablet   predniSONE 20 MG tablet Commonly known as: DELTASONE     TAKE these medications     Indication  albuterol 108 (90 Base) MCG/ACT inhaler Commonly known as: VENTOLIN HFA Inhale 2 puffs into the lungs every 4 (four) hours as needed for wheezing or shortness of breath.  Indication: Chronic Obstructive Lung Disease   ARIPiprazole 5 MG tablet Commonly known as: ABILIFY Take 1 tablet (5 mg total) by mouth at bedtime.  Indication: Major Depressive Disorder   DULoxetine 20 MG capsule Commonly known as: CYMBALTA Take 1 capsule (20 mg total) by mouth daily. Start taking on: May 06, 2020  Indication: Major Depressive Disorder   folic acid 1 MG tablet Commonly known as: FOLVITE Take 1 tablet (1 mg total) by mouth daily.  Indication: Nutrition supplement   hydrOXYzine 25 MG tablet Commonly known as: ATARAX/VISTARIL Take 1 tablet (25 mg total) by mouth 3 (three) times daily as needed for anxiety.  Indication: Feeling Anxious   sulfamethoxazole-trimethoprim 800-160 MG tablet Commonly known as: BACTRIM DS Take 1 tablet by mouth every 12 (twelve) hours.  Indication: UTI   thiamine 100 MG tablet Take 1 tablet (100 mg total) by mouth  daily.  Indication: Alcohol-Induced Persisting Amnestic  Disorder   traZODone 50 MG tablet Commonly known as: DESYREL Take 1 tablet (50 mg total) by mouth at bedtime as needed for sleep.  Indication: Trouble Sleeping       Follow-up Information    [facility] Follow up.   Specialty: Behavioral Health Why: Have to schedule appointment with you directly. Please call at discharge to schedule follow up appointment.  Contact information: [address] [place] [address] [phone]              Follow-up recommendations:  Activity:  As tolerated Diet:  As recommended by your primary care doctor.  Comments:  Prescriptions given at discharge.  Patient agreeable to plan.  Given opportunity to ask questions.  Appears to feel comfortable with discharge denies any current suicidal or homicidal thought. Patient is also instructed prior to discharge to: Take all medications as prescribed by her mental healthcare provider. Report any adverse effects and or reactions from the medicines to her outpatient provider promptly. Patient has been instructed & cautioned: To not engage in alcohol and or illegal drug use while on prescription medicines. In the event of worsening symptoms, patient is instructed to call the crisis hotline, 911 and or go to the nearest ED for appropriate evaluation and treatment of symptoms. To follow-up with her primary care provider for your other medical issues, concerns and or health care needs. Signed: Karsten Ro, MD 05/05/2020, 12:53 PM

## 2020-05-04 NOTE — Progress Notes (Signed)
Date:  05/04/2020 Time:  8:00 PM   Group Topic/Focus:  Goals Group:   The focus of this group is to reflect on daily goals established daily goals to achieve during treatment and discuss how the patient can incorporate goal setting into their daily lives to aide in recovery.   Participation Level:  Active   Participation Quality:  Appropriate   Cognitive:  Appropriate   Insight: Appropriate   Engagement in Group:  Engaged   Modes of Intervention:  Discussion, Education and Exploration   Additional Comments:  Pt rated her energy at a 7. Pt has been reading a book. She was very engaged in group.   Raynelle Highland 05/04/2020

## 2020-05-05 LAB — URINE CULTURE: Culture: <10000 — AB

## 2020-05-05 MED ORDER — TRAZODONE HCL 50 MG PO TABS
50.0000 mg | ORAL_TABLET | Freq: Every evening | ORAL | 0 refills | Status: AC | PRN
Start: 2020-05-05 — End: ?

## 2020-05-05 MED ORDER — DULOXETINE HCL 20 MG PO CPEP
20.0000 mg | ORAL_CAPSULE | Freq: Every day | ORAL | 0 refills | Status: AC
Start: 2020-05-06 — End: 2020-06-05

## 2020-05-05 MED ORDER — ARIPIPRAZOLE 5 MG PO TABS
5.0000 mg | ORAL_TABLET | Freq: Every day | ORAL | 0 refills | Status: AC
Start: 2020-05-05 — End: 2020-06-04

## 2020-05-05 MED ORDER — SULFAMETHOXAZOLE-TRIMETHOPRIM 800-160 MG PO TABS: 1.0000 | ORAL_TABLET | Freq: Two times a day (BID) | ORAL | 0 refills | Status: AC

## 2020-05-05 MED ORDER — HYDROXYZINE HCL 25 MG PO TABS
25.0000 mg | ORAL_TABLET | Freq: Three times a day (TID) | ORAL | 0 refills | Status: AC | PRN
Start: 2020-05-05 — End: ?

## 2020-05-05 NOTE — Progress Notes (Signed)
Recreation Therapy Notes  Date:  8.30.21 Time: 0930 Location: [address] Group Room  Group Topic: Stress Management  Goal Area(s) Addresses:  Patient will identify positive stress management techniques. Patient will identify benefits of using stress management post d/c.  Intervention: Stress Management  Activity:  Guided Imagery.  LRT read a script that took patients on a journey through the forest.  Patients were to listen and follow along as script was read to engage in activity.    Education:  Stress Management, Discharge Planning.   Education Outcome: Acknowledges Education  Clinical Observations/Feedback: Pt did not attend activity.    Caroll Rancher, LRT/CTRS         Caroll Rancher A 05/05/2020 11:36 AM

## 2020-05-05 NOTE — BHH Suicide Risk Assessment (Signed)
[facility] Discharge Suicide Risk Assessment   Principal Problem: Major depressive disorder, recurrent episode, severe (HCC) Discharge Diagnoses: Principal Problem:   Major depressive disorder, recurrent episode, severe (HCC) Active Problems:   Alcohol dependence (HCC)   Hypokalemia   Hypomagnesemia   Lactic acid acidosis   Elevated LFTs   Wernicke's encephalopathy   Anemia of chronic disease   Suicidal ideation   Essential hypertension   Total Time spent with patient: 15 minutes  Musculoskeletal: Strength & Muscle Tone: within normal limits Gait & Station: shuffle Patient leans: N/A  Psychiatric Specialty Exam: Review of Systems  All other systems reviewed and are negative.   Blood pressure 94/64, pulse (!) 106, temperature 98.3 F (36.8 C), temperature source Oral, resp. rate 18, height 5\' 4"  (1.626 m), weight 65.8 kg, SpO2 97 %.Body mass index is 24.9 kg/m.  General Appearance: Casual  Eye Contact::  Good  Speech:  Normal Rate409  Volume:  Normal  Mood:  Euthymic  Affect:  Congruent  Thought Process:  Coherent and Descriptions of Associations: Circumstantial  Orientation:  Full (Time, Place, and Person)  Thought Content:  Rumination  Suicidal Thoughts:  Yes.  without intent/plan  Homicidal Thoughts:  No  Memory:  Immediate;   Fair Recent;   Fair Remote;   Fair  Judgement:  Intact  Insight:  Lacking  Psychomotor Activity:  Normal  Concentration:  Good  Recall:  Fair  Fund of Knowledge:Fair  Language: Good  Akathisia:  Negative  Handed:  Right  AIMS (if indicated):     Assets:  Desire for Improvement Resilience  Sleep:  Number of Hours: 6.75  Cognition: WNL  ADL's:  Intact   Mental Status Per Nursing Assessment::   On Admission:  Suicidal ideation indicated by patient  Demographic Factors:  Divorced or widowed, Low socioeconomic status, Living alone and Unemployed  Loss Factors: Financial problems/change in socioeconomic status  Historical  Factors: Impulsivity  Risk Reduction Factors:   NA  Continued Clinical Symptoms:  Depression:   Comorbid alcohol abuse/dependence Impulsivity Alcohol/Substance Abuse/Dependencies  Cognitive Features That Contribute To Risk:  Closed-mindedness    Suicide Risk:  Minimal: No identifiable suicidal ideation.  Patients presenting with no risk factors but with morbid ruminations; may be classified as minimal risk based on the severity of the depressive symptoms   Follow-up Information    [facility] Follow up.   Specialty: Behavioral Health Why: Have to schedule appointment with you directly. Please call at discharge to schedule follow up appointment.  Contact information: [address] [place] [phone]              Plan Of Care/Follow-up recommendations:  Activity:  Ad lib  [phone], MD 05/05/2020, 12:08 PM

## 2020-05-05 NOTE — Progress Notes (Signed)
   05/05/20 2115  COVID-19 Daily Checkoff  Have you had a fever (temp > 37.80C/100F)  in the past 24 hours?  No  COVID-19 EXPOSURE  Have you traveled outside the state in the past 14 days? No  Have you been in contact with someone with a confirmed diagnosis of COVID-19 or PUI in the past 14 days without wearing appropriate PPE? No  Have you been living in the same home as a person with confirmed diagnosis of COVID-19 or a PUI (household contact)? No  Have you been diagnosed with COVID-19? No

## 2020-05-05 NOTE — Progress Notes (Signed)
Pt has been in the dayroom tonight, but is minimal with others. Not much interaction seen from pt. Pt is passively suicidal without a plan and verbally contracts for safety. Pt denies HI and AVH. Active listening, reassurance, and support provided. Medications administered as ordered by MD. Q 15 min safety checks continue. Pt's safety has been maintained.   05/05/20 2115  Psych Admission Type (Psych Patients Only)  Admission Status Voluntary  Psychosocial Assessment  Patient Complaints Depression  Eye Contact Fair  Facial Expression Flat  Affect Depressed;Sad  Speech Logical/coherent  Interaction Minimal  Motor Activity Slow;Unsteady (uses walker )  Appearance/Hygiene Unremarkable  Behavior Characteristics Cooperative;Appropriate to situation;Calm  Mood Depressed;Sad  Thought Process  Coherency WDL  Content WDL  Delusions None reported or observed  Perception WDL  Hallucination None reported or observed  Judgment WDL  Confusion None  Danger to Self  Current suicidal ideation? Passive  Self-Injurious Behavior No self-injurious ideation or behavior indicators observed or expressed   Agreement Not to Harm Self Yes  Description of Agreement verbally contracts for safety  Danger to Others  Danger to Others None reported or observed

## 2020-05-05 NOTE — Progress Notes (Signed)
D:  Patient's self inventory sheet, patient has fair sleep, sleep medication helpful.  Fair appetite, low energy level, poor concentration.  Rated depression and hopeless 8, anxiety 7.  Denied withdrawals.  SI, contracts for safety.  Denied physical pain.  Goal is myself.  Goal, start thinking about what I want, need or kill myself.  Does have discharge plans. A:  Medications administered per MD orders.  Emotional support and encouragement given patient. R:  Denied HI.  Denied A/V hallucinations. SI, contracts for safety.  No plan.  Safety maintained with 15 minute checks.

## 2020-05-05 NOTE — Progress Notes (Addendum)
Patient did not attend Goal Setting and Stress Management Group.

## 2020-05-05 NOTE — Progress Notes (Signed)
[facility]  05/05/2020 1:56 PM Virginia Hess  MRN:  [id number] Subjective:  Pt is seen and examined today. Pt states her mood is fine. Pt endorses passive SI states states she is thinking of a plan. Pt denies Homicidal ideation. Pt slept well last night. Nursing notes indicate that Pt slept for 6.75hours. She says her anxiety is low today and rates it 3/10. She says her appetite is good today. Pt is encouraged to call other shelter places. Pt states she hasn't heard from any shelter places but will call. Pt is homeless and she plans to go to shelter after discharge. Pt states her urination is better and denies any burning or increased urination. Pt c/o mild headache and states she took tylenol in the morning for that. Pt denies any nausea, vomiting, dizziness, chest pain, SOB, abdominal pain, diarrhea, and constipation. Pt denies any medication side effects and has been tolerating it well. Pt denies any concerns. On examination, Pt is calm,cooperative and oriented x3. Pt speech isnormalwith normal volume. Pt's Mood is euthymic and affect iscongruent.Pt is notresponding to any internal stimuli. Endorses SIwith no plan, Denies HI , denies AVH.  Nursing notes from yesterday - Pt compliant with taking meds and denies any medication side effects. Patient participated in groups and remain engaged. Rated her energy at 7. Denied any withdrawals. Reported SI, contracts for safety. This morning, Patient did not attend Goal Setting and Stress Management Group.   [facility] but they informed that only top bunkers are available and Pt has to be on her feet for 40 hours a week and work. Since Pt uses walker so [facility] may not be an appropriate choice for her to go after discharge. CSW to call other shelter places for her to go after discharge.  Objective :Patient is a 54 y.o. female withno past psychiatric historywhoinitially [facility] after calling  in themorningaftershe attempted suicide.Patient broke picture frame and caused injury to self bilateral wrist and abrasion to neck.Pt is admitted for stabilization of her symptoms. Principal Problem: Major depressive disorder, recurrent episode, severe (HCC) Diagnosis: Principal Problem:   Major depressive disorder, recurrent episode, severe (HCC) Active Problems:   Alcohol dependence (HCC)   Hypokalemia   Hypomagnesemia   Lactic acid acidosis   Elevated LFTs   Wernicke's encephalopathy   Anemia of chronic disease   Suicidal ideation   Essential hypertension  Total Time spent with patient: 20 minutes  Past Psychiatric History: See H&P  Past Medical History:  Past Medical History:  Diagnosis Date  . Alcohol dependence (HCC)   . Depression   . Gravida 4 para 4     Past Surgical History:  Procedure Laterality Date  . NO PAST SURGERIES     Family History:  Family History  Problem Relation Age of Onset  . Hypertension Brother   . Cancer Neg Hx   . Heart disease Neg Hx   . Stroke Neg Hx    Family Psychiatric  History: see H&P Social History:  Social History   Substance and Sexual Activity  Alcohol Use Yes   Comment: 2 daily     Social History   Substance and Sexual Activity  Drug Use No    Social History   Socioeconomic History  . Marital status: Single    Spouse name: Not on file  . Number of children: Not on file  . Years of education: Not on file  . Highest education level:  Not on file  Occupational History  . Not on file  Tobacco Use  . Smoking status: Current Every Day Smoker    Packs/day: 0.50    Years: 35.00    Pack years: 17.50    Types: Cigarettes  . Smokeless tobacco: Never Used  Substance and Sexual Activity  . Alcohol use: Yes    Comment: 2 daily  . Drug use: No  . Sexual activity: Not on file  Other Topics Concern  . Not on file  Social History Narrative  . Not on file   Social Determinants of Health   Financial Resource  Strain:   . Difficulty of Paying Living Expenses: Not on file  Food Insecurity:   . Worried About Programme researcher, broadcasting/film/video in the Last Year: Not on file  . Ran Out of Food in the Last Year: Not on file  Transportation Needs:   . Lack of Transportation (Medical): Not on file  . Lack of Transportation (Non-Medical): Not on file  Physical Activity:   . Days of Exercise per Week: Not on file  . Minutes of Exercise per Session: Not on file  Stress:   . Feeling of Stress : Not on file  Social Connections:   . Frequency of Communication with Friends and Family: Not on file  . Frequency of Social Gatherings with Friends and Family: Not on file  . Attends Religious Services: Not on file  . Active Member of Clubs or Organizations: Not on file  . Attends Banker Meetings: Not on file  . Marital Status: Not on file   Additional Social History:                         Sleep: Good  Appetite:  Good  Current Medications: Current Facility-Administered Medications  Medication Dose Route Frequency Provider Last Rate Last Admin  . acetaminophen (TYLENOL) tablet 650 mg  650 mg Oral Q6H PRN Antonieta Pert, MD   650 mg at 05/05/20 0748  . albuterol (VENTOLIN HFA) 108 (90 Base) MCG/ACT inhaler 2 puff  2 puff Inhalation Q4H PRN Mariel Craft, MD      . alum & mag hydroxide-simeth (MAALOX/MYLANTA) 200-200-20 MG/5ML suspension 30 mL  30 mL Oral Q4H PRN Antonieta Pert, MD      . ARIPiprazole (ABILIFY) tablet 5 mg  5 mg Oral QHS Antonieta Pert, MD   5 mg at 05/04/20 2100  . DULoxetine (CYMBALTA) DR capsule 20 mg  20 mg Oral Daily Karsten Ro, MD   20 mg at 05/05/20 0746  . folic acid (FOLVITE) tablet 1 mg  1 mg Oral Daily Antonieta Pert, MD   1 mg at 05/05/20 0746  . hydrOXYzine (ATARAX/VISTARIL) tablet 25 mg  25 mg Oral TID PRN Antonieta Pert, MD   25 mg at 05/04/20 2100  . magnesium hydroxide (MILK OF MAGNESIA) suspension 30 mL  30 mL Oral Daily PRN Antonieta Pert, MD      . sulfamethoxazole-trimethoprim (BACTRIM DS) 800-160 MG per tablet 1 tablet  1 tablet Oral Q12H Karsten Ro, MD   1 tablet at 05/05/20 0746  . thiamine tablet 100 mg  100 mg Oral Daily Dagar, Anjali, MD   100 mg at 05/05/20 0746  . traZODone (DESYREL) tablet 50 mg  50 mg Oral QHS PRN Antonieta Pert, MD   50 mg at 05/04/20 2100    Lab Results: No results found for this  or any previous visit (from the past 48 hour(s)).  Blood Alcohol level:  Lab Results  Component Value Date   ETH <10 04/22/2020   ETH 176 (H) 04/14/2020    Metabolic Disorder Labs: No results found for: HGBA1C, MPG No results found for: PROLACTIN No results found for: CHOL, TRIG, HDL, CHOLHDL, VLDL, LDLCALC  Physical Findings: AIMS: Facial and Oral Movements Muscles of Facial Expression: None, normal Lips and Perioral Area: None, normal Jaw: None, normal Tongue: None, normal,Extremity Movements Upper (arms, wrists, hands, fingers): None, normal Lower (legs, knees, ankles, toes): None, normal, Trunk Movements Neck, shoulders, hips: None, normal, Overall Severity Severity of abnormal movements (highest score from questions above): None, normal Incapacitation due to abnormal movements: None, normal Patient's awareness of abnormal movements (rate only patient's report): No Awareness, Dental Status Current problems with teeth and/or dentures?: No Does patient usually wear dentures?: No  CIWA:  CIWA-Ar Total: 2 COWS:     Musculoskeletal: Strength & Muscle Tone: within normal limits Gait & Station: unsteady Pt uses walker. Patient leans: N/A  Psychiatric Specialty Exam: Physical Exam Vitals and nursing note reviewed.  Constitutional:      General: She is not in acute distress.    Appearance: Normal appearance. She is not ill-appearing, toxic-appearing or diaphoretic.  HENT:     Head: Normocephalic and atraumatic.  Pulmonary:     Effort: Pulmonary effort is normal.  Neurological:      Mental Status: She is alert and oriented to person, place, and time.     Review of Systems  Constitutional: Negative for chills, fatigue and fever.  Respiratory: Negative for chest tightness and shortness of breath.   Cardiovascular: Negative for chest pain and palpitations.  Gastrointestinal: Negative for abdominal pain, constipation, diarrhea, nausea and vomiting.  Genitourinary: Negative for dysuria and frequency.  Neurological: Positive for headaches. Negative for dizziness and light-headedness.  Psychiatric/Behavioral: Positive for suicidal ideas. Negative for hallucinations and sleep disturbance. The patient is not nervous/anxious.     Blood pressure 94/64, pulse (!) 106, temperature 98.3 F (36.8 C), temperature source Oral, resp. rate 18, height 5\' 4"  (1.626 m), weight 65.8 kg, SpO2 97 %.Body mass index is 24.9 kg/m.  General Appearance: Casual  Eye Contact:  Good  Speech:  Normal Rate  Volume:  Normal  Mood:  Euthymic  Affect:  Congruent  Thought Process:  Coherent and Descriptions of Associations: Circumstantial  Orientation:  Full (Time, Place, and Person)  Thought Content:  Rumination  Suicidal Thoughts:  Yes.  without intent/plan  Homicidal Thoughts:  No  Memory:  Immediate;   Fair Recent;   Fair Remote;   Fair  Judgement:  Intact  Insight:  Lacking  Psychomotor Activity:  Normal  Concentration:  Concentration: Good and Attention Span: Good  Recall:  of Knowledge:  Fair  Language:  Good  Akathisia:  Negative  Handed:  Right  AIMS (if indicated):     Assets:  Desire for Improvement Resilience  ADL's:  Intact  Cognition:  WNL  Sleep:  Number of Hours: 6.75     Treatment Plan Summary:Patient is a 54 y.o. female withno past psychiatric historywhoinitially presentedvia EMS [facility] after calling in themorningaftershe attempted suicide.Patient broke picture frame and caused injury to self bilateral wrist and abrasion to neck.Pt is admitted for  stabilization of her symptoms. On examination, Pt is calm,cooperative and oriented x3. Pt speech isnormalwith normal volume. Pt's Mood is euthymic and affect iscongruent.Pt is notresponding to any internal stimuli. Endorses  SIwith no plan, Denies HI , denies AVH.  Blood pressure 94/64, pulse (!) 106, temperature 98.3 F (36.8 C), temperature source Oral, resp. rate 18, height 5\' 4"  (1.626 m), weight 65.8 kg, SpO2 97 %. No new Labs CSW tried to call [facility] but they informed that only top bunkers are available and Pt has to be on her feet for 40 hours a week and work. Since Pt uses walker so [facility] may not be an appropriate choice for her to go after discharge. C Plan-  Daily contact with patient to assess and evaluate symptoms and progress in treatment  - CSW found a bed for her to go at a women's shelter which is not available until tomorrow morning. - Pt can be discharged ASAP whenever the bed is available for her at the shelter. - Continue Cymbalta 20 mg. - Continue Abilify 5 mg  -Continue Ativan 1 mg Q6h PRN for withdrawal symptoms. -Continue Vistaril 25 mg PO TID PRN for anxiety. -Continue Trazodone100mg  PO QHS PRN for insomnia. -Encourgement to attend group therapies. -Thiamine 100 mg Daily -Folic acid 1 mg Daily -Albuterol inhaler 2 puffs Q 4H PRN -Monitor SI, Vitals, and medication side effects - Pt will be Discharged tomorrow morning to the shelter.    [facility], MD 05/05/2020, 1:56 PM

## 2020-05-06 NOTE — Progress Notes (Signed)
  [facility] Adult Case Management Discharge Plan :  Will you be returning to the same living situation after discharge:  No. To a shelter. At discharge, do you have transportation home?: No. Safe Transport has been arranged. Do you have the ability to pay for your medications: Yes,  has medicaid  Release of information consent forms completed and in the chart;  Patient's signature needed at discharge.  Patient to Follow up at:  Follow-up Information    [facility] Follow up.   Specialty: Behavioral Health Why: Have to schedule appointment with you directly. Please call at discharge to schedule follow up appointment.  Contact information: [address] [place] [address] [phone]              Next level of care provider has access to [facility] Link:no  Safety Planning and Suicide Prevention discussed: Yes,  with patient.     Has patient been referred to the Quitline?: Patient refused referral  Patient has been referred for addiction treatment: Pt. refused referral  Otelia Santee, LCSW 05/06/2020, 10:14 AM

## 2020-05-06 NOTE — Progress Notes (Signed)
RN met with pt and reviewed pt's discharge instructions.  Pt verbalized understanding of discharge instructions and pt did not have any questions. RN reviewed and provided pt with a copy of SRA, AVS and Transition Record.  RN returned pt's belongings to pt.  Prescriptions and samples were given to pt. Pt was appreciative of the care pt received at [facility].  Patient discharged to the lobby and this RN assisted pt to safe transport car who was waiting to take pt to [facility] (shelter).

## 2020-05-06 NOTE — [facility] Suicide Risk Assessment (Signed)
[facility] Discharge Suicide Risk Assessment   Principal Problem: Major depressive disorder, recurrent episode, severe (HCC) Discharge Diagnoses: Principal Problem:   Major depressive disorder, recurrent episode, severe (HCC) Active Problems:   Alcohol dependence (HCC)   Hypokalemia   Hypomagnesemia   Lactic acid acidosis   Elevated LFTs   Wernicke's encephalopathy   Anemia of chronic disease   Suicidal ideation   Essential hypertension   Total Time spent with patient: 15 minutes  Musculoskeletal: Strength & Muscle Tone: within normal limits Gait & Station: shuffle Patient leans: N/A  Psychiatric Specialty Exam: Review of Systems  All other systems reviewed and are negative.   Blood pressure 95/63, pulse (!) 121, temperature 98.2 F (36.8 C), temperature source Oral, resp. rate 18, height 5\' 4"  (1.626 m), weight 65.8 kg, SpO2 97 %.Body mass index is 24.9 kg/m.  General Appearance: Casual  Eye Contact::  Fair  Speech:  Normal Rate409  Volume:  Normal  Mood:  Euthymic  Affect:  Congruent  Thought Process:  Coherent and Descriptions of Associations: Circumstantial  Orientation:  Full (Time, Place, and Person)  Thought Content:  Logical  Suicidal Thoughts:  Yes.  without intent/plan  Homicidal Thoughts:  No  Memory:  Immediate;   Fair Recent;   Fair Remote;   Fair  Judgement:  Intact  Insight:  Fair  Psychomotor Activity:  Normal  Concentration:  Fair  Recall:  [phone] of Knowledge:Fair  Language: Good  Akathisia:  Negative  Handed:  Right  AIMS (if indicated):     Assets:  Desire for Improvement Resilience  Sleep:  Number of Hours: 6.5  Cognition: WNL  ADL's:  Intact   Mental Status Per Nursing Assessment::   On Admission:  Suicidal ideation indicated by patient  Demographic Factors:  Divorced or widowed, Low socioeconomic status, Living alone and Unemployed  Loss Factors: Financial problems/change in socioeconomic status  Historical  Factors: Impulsivity  Risk Reduction Factors:   NA  Continued Clinical Symptoms:  Depression:   Comorbid alcohol abuse/dependence Impulsivity Alcohol/Substance Abuse/Dependencies  Cognitive Features That Contribute To Risk:  None    Suicide Risk:  Minimal: No identifiable suicidal ideation.  Patients presenting with no risk factors but with morbid ruminations; may be classified as minimal risk based on the severity of the depressive symptoms   Follow-up Information    [facility] Follow up.   Specialty: Behavioral Health Why: Have to schedule appointment with you directly. Please call at discharge to schedule follow up appointment.  Contact information: [address] [place] [phone]              Plan Of Care/Follow-up recommendations:  Activity:  ad lib  [phone], MD 05/06/2020, 8:01 AM

## 2024-05-18 DIAGNOSIS — F419 Anxiety disorder, unspecified: Secondary | ICD-10-CM | POA: Diagnosis not present

## 2024-05-18 DIAGNOSIS — E876 Hypokalemia: Secondary | ICD-10-CM | POA: Diagnosis not present

## 2024-05-18 DIAGNOSIS — M6281 Muscle weakness (generalized): Secondary | ICD-10-CM | POA: Diagnosis not present

## 2024-05-18 DIAGNOSIS — N19 Unspecified kidney failure: Secondary | ICD-10-CM | POA: Diagnosis not present

## 2024-05-18 DIAGNOSIS — F1090 Alcohol use, unspecified, uncomplicated: Secondary | ICD-10-CM | POA: Diagnosis not present

## 2024-05-18 DIAGNOSIS — E872 Acidosis, unspecified: Secondary | ICD-10-CM | POA: Diagnosis not present

## 2024-05-18 DIAGNOSIS — N170 Acute kidney failure with tubular necrosis: Secondary | ICD-10-CM | POA: Diagnosis not present

## 2024-05-18 DIAGNOSIS — G928 Other toxic encephalopathy: Secondary | ICD-10-CM | POA: Diagnosis not present

## 2024-05-18 DIAGNOSIS — D696 Thrombocytopenia, unspecified: Secondary | ICD-10-CM | POA: Diagnosis not present

## 2024-05-18 DIAGNOSIS — E870 Hyperosmolality and hypernatremia: Secondary | ICD-10-CM | POA: Diagnosis not present

## 2024-05-18 DIAGNOSIS — R392 Extrarenal uremia: Secondary | ICD-10-CM | POA: Diagnosis not present

## 2024-05-18 DIAGNOSIS — F1010 Alcohol abuse, uncomplicated: Secondary | ICD-10-CM | POA: Diagnosis not present

## 2024-05-18 DIAGNOSIS — E873 Alkalosis: Secondary | ICD-10-CM | POA: Diagnosis not present

## 2024-05-18 DIAGNOSIS — R4182 Altered mental status, unspecified: Secondary | ICD-10-CM | POA: Diagnosis not present

## 2024-05-18 DIAGNOSIS — G9341 Metabolic encephalopathy: Secondary | ICD-10-CM | POA: Diagnosis not present

## 2024-05-18 DIAGNOSIS — R1311 Dysphagia, oral phase: Secondary | ICD-10-CM | POA: Diagnosis not present

## 2024-05-18 DIAGNOSIS — R188 Other ascites: Secondary | ICD-10-CM | POA: Diagnosis not present

## 2024-05-18 DIAGNOSIS — G9340 Encephalopathy, unspecified: Secondary | ICD-10-CM | POA: Diagnosis not present

## 2024-05-18 DIAGNOSIS — J449 Chronic obstructive pulmonary disease, unspecified: Secondary | ICD-10-CM | POA: Diagnosis not present

## 2024-05-18 DIAGNOSIS — E512 Wernicke's encephalopathy: Secondary | ICD-10-CM | POA: Diagnosis not present

## 2024-05-18 DIAGNOSIS — A419 Sepsis, unspecified organism: Secondary | ICD-10-CM | POA: Diagnosis not present

## 2024-05-18 DIAGNOSIS — N179 Acute kidney failure, unspecified: Secondary | ICD-10-CM | POA: Diagnosis not present

## 2024-05-18 DIAGNOSIS — R41841 Cognitive communication deficit: Secondary | ICD-10-CM | POA: Diagnosis not present

## 2024-05-18 DIAGNOSIS — J4489 Other specified chronic obstructive pulmonary disease: Secondary | ICD-10-CM | POA: Diagnosis not present

## 2024-05-18 DIAGNOSIS — D539 Nutritional anemia, unspecified: Secondary | ICD-10-CM | POA: Diagnosis not present

## 2024-05-18 DIAGNOSIS — I10 Essential (primary) hypertension: Secondary | ICD-10-CM | POA: Diagnosis not present

## 2024-05-19 DIAGNOSIS — A419 Sepsis, unspecified organism: Secondary | ICD-10-CM | POA: Diagnosis not present

## 2024-05-19 DIAGNOSIS — G928 Other toxic encephalopathy: Secondary | ICD-10-CM | POA: Diagnosis not present

## 2024-05-23 ENCOUNTER — Telehealth: Payer: Self-pay

## 2024-05-23 NOTE — Telephone Encounter (Signed)
 Copied from CRM #[id number]. Topic: General - Other >> May 23, 2024  9:11 AM Miquel SAILOR wrote: Reason for CRM: Alex from [facility] called to verify if PT is with us . She is not our PT.

## 2024-05-24 DIAGNOSIS — G3184 Mild cognitive impairment, so stated: Secondary | ICD-10-CM | POA: Diagnosis not present

## 2024-05-24 DIAGNOSIS — F32A Depression, unspecified: Secondary | ICD-10-CM | POA: Diagnosis not present

## 2024-05-24 DIAGNOSIS — F1010 Alcohol abuse, uncomplicated: Secondary | ICD-10-CM | POA: Diagnosis not present

## 2024-05-24 DIAGNOSIS — R059 Cough, unspecified: Secondary | ICD-10-CM | POA: Diagnosis not present

## 2024-05-24 DIAGNOSIS — N19 Unspecified kidney failure: Secondary | ICD-10-CM | POA: Diagnosis not present

## 2024-05-24 DIAGNOSIS — F411 Generalized anxiety disorder: Secondary | ICD-10-CM | POA: Diagnosis not present

## 2024-05-24 DIAGNOSIS — B379 Candidiasis, unspecified: Secondary | ICD-10-CM | POA: Diagnosis not present

## 2024-05-24 DIAGNOSIS — F1020 Alcohol dependence, uncomplicated: Secondary | ICD-10-CM | POA: Diagnosis not present

## 2024-05-24 DIAGNOSIS — R392 Extrarenal uremia: Secondary | ICD-10-CM | POA: Diagnosis not present

## 2024-05-24 DIAGNOSIS — F331 Major depressive disorder, recurrent, moderate: Secondary | ICD-10-CM | POA: Diagnosis not present

## 2024-05-24 DIAGNOSIS — D696 Thrombocytopenia, unspecified: Secondary | ICD-10-CM | POA: Diagnosis not present

## 2024-05-24 DIAGNOSIS — E876 Hypokalemia: Secondary | ICD-10-CM | POA: Diagnosis not present

## 2024-05-24 DIAGNOSIS — G9341 Metabolic encephalopathy: Secondary | ICD-10-CM | POA: Diagnosis not present

## 2024-05-24 DIAGNOSIS — R4189 Other symptoms and signs involving cognitive functions and awareness: Secondary | ICD-10-CM | POA: Diagnosis not present

## 2024-05-24 DIAGNOSIS — N179 Acute kidney failure, unspecified: Secondary | ICD-10-CM | POA: Diagnosis not present

## 2024-05-24 DIAGNOSIS — Z7409 Other reduced mobility: Secondary | ICD-10-CM | POA: Diagnosis not present

## 2024-05-24 DIAGNOSIS — F1021 Alcohol dependence, in remission: Secondary | ICD-10-CM | POA: Diagnosis not present

## 2024-05-24 DIAGNOSIS — R41841 Cognitive communication deficit: Secondary | ICD-10-CM | POA: Diagnosis not present

## 2024-05-24 DIAGNOSIS — E870 Hyperosmolality and hypernatremia: Secondary | ICD-10-CM | POA: Diagnosis not present

## 2024-05-24 DIAGNOSIS — I10 Essential (primary) hypertension: Secondary | ICD-10-CM | POA: Diagnosis not present

## 2024-05-24 DIAGNOSIS — W19XXXA Unspecified fall, initial encounter: Secondary | ICD-10-CM | POA: Diagnosis not present

## 2024-05-24 DIAGNOSIS — M6281 Muscle weakness (generalized): Secondary | ICD-10-CM | POA: Diagnosis not present

## 2024-05-24 DIAGNOSIS — J449 Chronic obstructive pulmonary disease, unspecified: Secondary | ICD-10-CM | POA: Diagnosis not present

## 2024-05-24 DIAGNOSIS — D539 Nutritional anemia, unspecified: Secondary | ICD-10-CM | POA: Diagnosis not present

## 2024-05-24 DIAGNOSIS — G928 Other toxic encephalopathy: Secondary | ICD-10-CM | POA: Diagnosis not present

## 2024-05-24 DIAGNOSIS — R531 Weakness: Secondary | ICD-10-CM | POA: Diagnosis not present

## 2024-05-24 DIAGNOSIS — E512 Wernicke's encephalopathy: Secondary | ICD-10-CM | POA: Diagnosis not present

## 2024-05-24 DIAGNOSIS — R1311 Dysphagia, oral phase: Secondary | ICD-10-CM | POA: Diagnosis not present

## 2024-05-24 DIAGNOSIS — F419 Anxiety disorder, unspecified: Secondary | ICD-10-CM | POA: Diagnosis not present

## 2024-05-24 DIAGNOSIS — F5105 Insomnia due to other mental disorder: Secondary | ICD-10-CM | POA: Diagnosis not present

## 2024-05-24 DIAGNOSIS — N898 Other specified noninflammatory disorders of vagina: Secondary | ICD-10-CM | POA: Diagnosis not present

## 2024-05-24 DIAGNOSIS — R5381 Other malaise: Secondary | ICD-10-CM | POA: Diagnosis not present

## 2024-05-24 DIAGNOSIS — Z9181 History of falling: Secondary | ICD-10-CM | POA: Diagnosis not present

## 2024-05-24 DIAGNOSIS — J4489 Other specified chronic obstructive pulmonary disease: Secondary | ICD-10-CM | POA: Diagnosis not present

## 2024-05-24 DIAGNOSIS — F1090 Alcohol use, unspecified, uncomplicated: Secondary | ICD-10-CM | POA: Diagnosis not present

## 2024-05-25 DIAGNOSIS — J449 Chronic obstructive pulmonary disease, unspecified: Secondary | ICD-10-CM | POA: Diagnosis not present

## 2024-05-25 DIAGNOSIS — N179 Acute kidney failure, unspecified: Secondary | ICD-10-CM | POA: Diagnosis not present

## 2024-05-25 DIAGNOSIS — I10 Essential (primary) hypertension: Secondary | ICD-10-CM | POA: Diagnosis not present

## 2024-05-25 DIAGNOSIS — F32A Depression, unspecified: Secondary | ICD-10-CM | POA: Diagnosis not present

## 2024-05-25 DIAGNOSIS — R531 Weakness: Secondary | ICD-10-CM | POA: Diagnosis not present

## 2024-05-25 DIAGNOSIS — N19 Unspecified kidney failure: Secondary | ICD-10-CM | POA: Diagnosis not present

## 2024-05-25 DIAGNOSIS — F1010 Alcohol abuse, uncomplicated: Secondary | ICD-10-CM | POA: Diagnosis not present

## 2024-05-25 DIAGNOSIS — G9341 Metabolic encephalopathy: Secondary | ICD-10-CM | POA: Diagnosis not present

## 2024-05-25 DIAGNOSIS — F419 Anxiety disorder, unspecified: Secondary | ICD-10-CM | POA: Diagnosis not present

## 2024-05-26 DIAGNOSIS — R531 Weakness: Secondary | ICD-10-CM | POA: Diagnosis not present

## 2024-05-26 DIAGNOSIS — J449 Chronic obstructive pulmonary disease, unspecified: Secondary | ICD-10-CM | POA: Diagnosis not present

## 2024-05-26 DIAGNOSIS — Z7409 Other reduced mobility: Secondary | ICD-10-CM | POA: Diagnosis not present

## 2024-05-26 DIAGNOSIS — R4189 Other symptoms and signs involving cognitive functions and awareness: Secondary | ICD-10-CM | POA: Diagnosis not present

## 2024-05-26 DIAGNOSIS — R059 Cough, unspecified: Secondary | ICD-10-CM | POA: Diagnosis not present

## 2024-05-27 DIAGNOSIS — R059 Cough, unspecified: Secondary | ICD-10-CM | POA: Diagnosis not present

## 2024-05-27 DIAGNOSIS — Z9181 History of falling: Secondary | ICD-10-CM | POA: Diagnosis not present

## 2024-05-27 DIAGNOSIS — R531 Weakness: Secondary | ICD-10-CM | POA: Diagnosis not present

## 2024-05-27 DIAGNOSIS — J449 Chronic obstructive pulmonary disease, unspecified: Secondary | ICD-10-CM | POA: Diagnosis not present

## 2024-05-27 DIAGNOSIS — I10 Essential (primary) hypertension: Secondary | ICD-10-CM | POA: Diagnosis not present

## 2024-05-27 DIAGNOSIS — E512 Wernicke's encephalopathy: Secondary | ICD-10-CM | POA: Diagnosis not present

## 2024-05-27 DIAGNOSIS — F419 Anxiety disorder, unspecified: Secondary | ICD-10-CM | POA: Diagnosis not present

## 2024-05-27 DIAGNOSIS — Z7409 Other reduced mobility: Secondary | ICD-10-CM | POA: Diagnosis not present

## 2024-05-27 DIAGNOSIS — F32A Depression, unspecified: Secondary | ICD-10-CM | POA: Diagnosis not present

## 2024-05-29 DIAGNOSIS — N898 Other specified noninflammatory disorders of vagina: Secondary | ICD-10-CM | POA: Diagnosis not present

## 2024-05-29 DIAGNOSIS — R531 Weakness: Secondary | ICD-10-CM | POA: Diagnosis not present

## 2024-05-29 DIAGNOSIS — R5381 Other malaise: Secondary | ICD-10-CM | POA: Diagnosis not present

## 2024-05-29 DIAGNOSIS — Z9181 History of falling: Secondary | ICD-10-CM | POA: Diagnosis not present

## 2024-05-29 DIAGNOSIS — J449 Chronic obstructive pulmonary disease, unspecified: Secondary | ICD-10-CM | POA: Diagnosis not present

## 2024-05-29 DIAGNOSIS — R4189 Other symptoms and signs involving cognitive functions and awareness: Secondary | ICD-10-CM | POA: Diagnosis not present

## 2024-05-29 DIAGNOSIS — Z7409 Other reduced mobility: Secondary | ICD-10-CM | POA: Diagnosis not present

## 2024-05-30 DIAGNOSIS — F419 Anxiety disorder, unspecified: Secondary | ICD-10-CM | POA: Diagnosis not present

## 2024-05-30 DIAGNOSIS — R41841 Cognitive communication deficit: Secondary | ICD-10-CM | POA: Diagnosis not present

## 2024-05-30 DIAGNOSIS — N19 Unspecified kidney failure: Secondary | ICD-10-CM | POA: Diagnosis not present

## 2024-05-30 DIAGNOSIS — I10 Essential (primary) hypertension: Secondary | ICD-10-CM | POA: Diagnosis not present

## 2024-05-30 DIAGNOSIS — N179 Acute kidney failure, unspecified: Secondary | ICD-10-CM | POA: Diagnosis not present

## 2024-05-30 DIAGNOSIS — G9341 Metabolic encephalopathy: Secondary | ICD-10-CM | POA: Diagnosis not present

## 2024-05-30 DIAGNOSIS — R4189 Other symptoms and signs involving cognitive functions and awareness: Secondary | ICD-10-CM | POA: Diagnosis not present

## 2024-05-30 DIAGNOSIS — F32A Depression, unspecified: Secondary | ICD-10-CM | POA: Diagnosis not present

## 2024-05-30 DIAGNOSIS — E512 Wernicke's encephalopathy: Secondary | ICD-10-CM | POA: Diagnosis not present

## 2024-05-31 DIAGNOSIS — F1020 Alcohol dependence, uncomplicated: Secondary | ICD-10-CM | POA: Diagnosis not present

## 2024-05-31 DIAGNOSIS — R531 Weakness: Secondary | ICD-10-CM | POA: Diagnosis not present

## 2024-05-31 DIAGNOSIS — Z9181 History of falling: Secondary | ICD-10-CM | POA: Diagnosis not present

## 2024-05-31 DIAGNOSIS — B379 Candidiasis, unspecified: Secondary | ICD-10-CM | POA: Diagnosis not present

## 2024-05-31 DIAGNOSIS — E512 Wernicke's encephalopathy: Secondary | ICD-10-CM | POA: Diagnosis not present

## 2024-05-31 DIAGNOSIS — Z7409 Other reduced mobility: Secondary | ICD-10-CM | POA: Diagnosis not present

## 2024-05-31 DIAGNOSIS — R1311 Dysphagia, oral phase: Secondary | ICD-10-CM | POA: Diagnosis not present

## 2024-05-31 DIAGNOSIS — I10 Essential (primary) hypertension: Secondary | ICD-10-CM | POA: Diagnosis not present

## 2024-05-31 DIAGNOSIS — R5381 Other malaise: Secondary | ICD-10-CM | POA: Diagnosis not present

## 2024-06-04 DIAGNOSIS — Z7409 Other reduced mobility: Secondary | ICD-10-CM | POA: Diagnosis not present

## 2024-06-04 DIAGNOSIS — Z9181 History of falling: Secondary | ICD-10-CM | POA: Diagnosis not present

## 2024-06-04 DIAGNOSIS — R5381 Other malaise: Secondary | ICD-10-CM | POA: Diagnosis not present

## 2024-06-04 DIAGNOSIS — R531 Weakness: Secondary | ICD-10-CM | POA: Diagnosis not present

## 2024-06-04 DIAGNOSIS — B379 Candidiasis, unspecified: Secondary | ICD-10-CM | POA: Diagnosis not present

## 2024-06-05 DIAGNOSIS — R531 Weakness: Secondary | ICD-10-CM | POA: Diagnosis not present

## 2024-06-05 DIAGNOSIS — Z7409 Other reduced mobility: Secondary | ICD-10-CM | POA: Diagnosis not present

## 2024-06-05 DIAGNOSIS — W19XXXA Unspecified fall, initial encounter: Secondary | ICD-10-CM | POA: Diagnosis not present

## 2024-06-07 DIAGNOSIS — F1021 Alcohol dependence, in remission: Secondary | ICD-10-CM | POA: Diagnosis not present

## 2024-06-07 DIAGNOSIS — F5105 Insomnia due to other mental disorder: Secondary | ICD-10-CM | POA: Diagnosis not present

## 2024-06-07 DIAGNOSIS — F331 Major depressive disorder, recurrent, moderate: Secondary | ICD-10-CM | POA: Diagnosis not present

## 2024-06-07 DIAGNOSIS — G3184 Mild cognitive impairment, so stated: Secondary | ICD-10-CM | POA: Diagnosis not present

## 2024-06-07 DIAGNOSIS — F411 Generalized anxiety disorder: Secondary | ICD-10-CM | POA: Diagnosis not present

## 2024-06-11 DIAGNOSIS — Z7409 Other reduced mobility: Secondary | ICD-10-CM | POA: Diagnosis not present

## 2024-06-11 DIAGNOSIS — R531 Weakness: Secondary | ICD-10-CM | POA: Diagnosis not present

## 2024-06-11 DIAGNOSIS — I10 Essential (primary) hypertension: Secondary | ICD-10-CM | POA: Diagnosis not present

## 2024-06-13 DIAGNOSIS — Z7409 Other reduced mobility: Secondary | ICD-10-CM | POA: Diagnosis not present

## 2024-06-13 DIAGNOSIS — B379 Candidiasis, unspecified: Secondary | ICD-10-CM | POA: Diagnosis not present

## 2024-06-13 DIAGNOSIS — R4189 Other symptoms and signs involving cognitive functions and awareness: Secondary | ICD-10-CM | POA: Diagnosis not present

## 2024-06-13 DIAGNOSIS — Z9181 History of falling: Secondary | ICD-10-CM | POA: Diagnosis not present

## 2024-06-13 DIAGNOSIS — R531 Weakness: Secondary | ICD-10-CM | POA: Diagnosis not present

## 2024-06-13 DIAGNOSIS — R5381 Other malaise: Secondary | ICD-10-CM | POA: Diagnosis not present
